# Patient Record
Sex: Female | Born: 1940 | Race: White | Hispanic: No | Smoking: Never smoker
Health system: Southern US, Community
[De-identification: ages and names within clinical notes are randomized; demographics above are authoritative.]

## PROBLEM LIST (undated history)

## (undated) DIAGNOSIS — E7800 Pure hypercholesterolemia, unspecified: Secondary | ICD-10-CM

## (undated) DIAGNOSIS — G459 Transient cerebral ischemic attack, unspecified: Secondary | ICD-10-CM

## (undated) DIAGNOSIS — Z800 Family history of malignant neoplasm of digestive organs: Secondary | ICD-10-CM

## (undated) DIAGNOSIS — Z801 Family history of malignant neoplasm of trachea, bronchus and lung: Secondary | ICD-10-CM

## (undated) DIAGNOSIS — Z803 Family history of malignant neoplasm of breast: Secondary | ICD-10-CM

## (undated) DIAGNOSIS — J302 Other seasonal allergic rhinitis: Secondary | ICD-10-CM

## (undated) DIAGNOSIS — Z923 Personal history of irradiation: Secondary | ICD-10-CM

## (undated) DIAGNOSIS — I10 Essential (primary) hypertension: Secondary | ICD-10-CM

## (undated) DIAGNOSIS — K219 Gastro-esophageal reflux disease without esophagitis: Secondary | ICD-10-CM

## (undated) DIAGNOSIS — Z9221 Personal history of antineoplastic chemotherapy: Secondary | ICD-10-CM

## (undated) HISTORY — DX: Family history of malignant neoplasm of trachea, bronchus and lung: Z80.1

## (undated) HISTORY — PX: MASTECTOMY: SHX3

## (undated) HISTORY — DX: Family history of malignant neoplasm of breast: Z80.3

## (undated) HISTORY — PX: BACK SURGERY: SHX140

## (undated) HISTORY — PX: PARTIAL HYSTERECTOMY: SHX80

## (undated) HISTORY — DX: Transient cerebral ischemic attack, unspecified: G45.9

## (undated) HISTORY — DX: Family history of malignant neoplasm of digestive organs: Z80.0

## (undated) HISTORY — PX: CHOLECYSTECTOMY: SHX55

---

## 1998-07-06 ENCOUNTER — Encounter: Admission: RE | Admit: 1998-07-06 | Discharge: 1998-10-04 | Payer: Self-pay | Admitting: *Deleted

## 1998-08-17 ENCOUNTER — Other Ambulatory Visit: Admission: RE | Admit: 1998-08-17 | Discharge: 1998-08-17 | Payer: Self-pay | Admitting: Obstetrics and Gynecology

## 1999-11-02 ENCOUNTER — Other Ambulatory Visit: Admission: RE | Admit: 1999-11-02 | Discharge: 1999-11-02 | Payer: Self-pay | Admitting: Obstetrics and Gynecology

## 2000-11-23 ENCOUNTER — Other Ambulatory Visit: Admission: RE | Admit: 2000-11-23 | Discharge: 2000-11-23 | Payer: Self-pay | Admitting: Obstetrics and Gynecology

## 2001-11-26 ENCOUNTER — Other Ambulatory Visit: Admission: RE | Admit: 2001-11-26 | Discharge: 2001-11-26 | Payer: Self-pay | Admitting: Obstetrics and Gynecology

## 2002-12-08 ENCOUNTER — Other Ambulatory Visit: Admission: RE | Admit: 2002-12-08 | Discharge: 2002-12-08 | Payer: Self-pay | Admitting: Obstetrics and Gynecology

## 2003-12-09 ENCOUNTER — Other Ambulatory Visit: Admission: RE | Admit: 2003-12-09 | Discharge: 2003-12-09 | Payer: Self-pay | Admitting: Obstetrics and Gynecology

## 2004-12-09 ENCOUNTER — Other Ambulatory Visit: Admission: RE | Admit: 2004-12-09 | Discharge: 2004-12-09 | Payer: Self-pay | Admitting: Obstetrics and Gynecology

## 2005-07-31 ENCOUNTER — Ambulatory Visit (HOSPITAL_COMMUNITY): Admission: RE | Admit: 2005-07-31 | Discharge: 2005-07-31 | Payer: Self-pay | Admitting: *Deleted

## 2005-12-11 ENCOUNTER — Other Ambulatory Visit: Admission: RE | Admit: 2005-12-11 | Discharge: 2005-12-11 | Payer: Self-pay | Admitting: Obstetrics and Gynecology

## 2009-07-26 ENCOUNTER — Encounter: Admission: RE | Admit: 2009-07-26 | Discharge: 2009-07-26 | Payer: Self-pay | Admitting: Internal Medicine

## 2009-08-02 ENCOUNTER — Encounter: Admission: RE | Admit: 2009-08-02 | Discharge: 2009-08-02 | Payer: Self-pay | Admitting: Neurosurgery

## 2009-08-05 ENCOUNTER — Ambulatory Visit (HOSPITAL_COMMUNITY): Admission: RE | Admit: 2009-08-05 | Discharge: 2009-08-06 | Payer: Self-pay | Admitting: Neurosurgery

## 2009-10-09 ENCOUNTER — Encounter: Admission: RE | Admit: 2009-10-09 | Discharge: 2009-10-09 | Payer: Self-pay | Admitting: Neurosurgery

## 2009-10-14 ENCOUNTER — Ambulatory Visit (HOSPITAL_COMMUNITY): Admission: RE | Admit: 2009-10-14 | Discharge: 2009-10-15 | Payer: Self-pay | Admitting: Neurosurgery

## 2010-11-21 ENCOUNTER — Encounter: Admission: RE | Admit: 2010-11-21 | Discharge: 2010-11-21 | Payer: Self-pay | Admitting: Internal Medicine

## 2011-03-30 LAB — BASIC METABOLIC PANEL
Calcium: 9.6 mg/dL (ref 8.4–10.5)
Creatinine, Ser: 0.91 mg/dL (ref 0.4–1.2)
GFR calc Af Amer: >60 mL/min (ref >60)
GFR calc non Af Amer: >60 mL/min (ref >60)
Glucose, Bld: 118 mg/dL — ABNORMAL HIGH (ref 70–99)
Sodium: 137 mEq/L (ref 135–145)

## 2011-03-30 LAB — CBC
Hemoglobin: 14.5 g/dL (ref 12.0–15.0)
RBC: 4.59 MIL/uL (ref 3.87–5.11)
RDW: 13.8 % (ref 11.5–15.5)

## 2011-04-02 LAB — URINALYSIS, ROUTINE W REFLEX MICROSCOPIC
Leukocytes, UA: NEGATIVE
Nitrite: NEGATIVE
Specific Gravity, Urine: 1.021 (ref 1.005–1.030)
Urobilinogen, UA: 0.2 mg/dL (ref 0.0–1.0)
pH: 6 (ref 5.0–8.0)

## 2011-04-02 LAB — CBC
HCT: 43.9 % (ref 36.0–46.0)
Hemoglobin: 14.9 g/dL (ref 12.0–15.0)
RBC: 4.89 MIL/uL (ref 3.87–5.11)
RDW: 14 % (ref 11.5–15.5)

## 2011-04-02 LAB — URINE MICROSCOPIC-ADD ON

## 2011-04-02 LAB — APTT: aPTT: 24 seconds (ref 24–37)

## 2011-04-02 LAB — BASIC METABOLIC PANEL
Calcium: 9.3 mg/dL (ref 8.4–10.5)
GFR calc Af Amer: 48 mL/min — ABNORMAL LOW (ref >60)
GFR calc non Af Amer: 39 mL/min — ABNORMAL LOW (ref >60)
Glucose, Bld: 119 mg/dL — ABNORMAL HIGH (ref 70–99)
Potassium: 4.2 mEq/L (ref 3.5–5.1)
Sodium: 136 mEq/L (ref 135–145)

## 2011-04-02 LAB — PROTIME-INR: INR: 0.9 (ref 0.00–1.49)

## 2011-05-09 NOTE — Op Note (Signed)
Tina Ellis, Tina Ellis               ACCOUNT NO.:  [id number]   MEDICAL RECORD NO.:  [id number]          PATIENT TYPE:  INP   LOCATION:  3526                         FACILITY:  [facility]   PHYSICIAN:  Clydene Fake, M.D.  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   PROCEDURE:  Left L3-4 semi-hemilaminectomy and diskectomy,  microdissection with microscope.   SURGEON:  Clydene Fake, MD   ASSISTANT:  Hewitt Shorts, MD   General endotracheal tube anesthesia.   ESTIMATED BLOOD LOSS:  Minimal.   BLOOD GIVEN:  None.   COMPLICATIONS:  None.   REASON FOR PROCEDURE:  The patient is a 70 year old woman who has been  having severe back and left leg pain and numbness.  MRI and myelogram  done showing extruded fragments at left side L3-4 causing nerve root  compression.  The patient brought in for decompression.   PROCEDURE IN DETAIL:  The patient was brought to the operating room.  General anesthesia was induced.  The patient was placed in a prone  position on the Wilson frame.  All pressure points were padded.  The  patient was prepped and draped in a sterile fashion.  The site of  incision was injected with 10 mL of 1% lidocaine with epinephrine.  Needle was placed in the interspace.  X-ray was obtained, showed we were  pointing at the L3-4 interspace.  Incision was then made centered where  the needle was.  Incision taken down to the fascia.  Hemostasis was  obtained with Bovie cauterization.  The fascia was incised with Bovie.  Subperiosteal dissection was done over the spinous process and lamina to  the facets at L3-4 level.  Self-retaining retractors were placed.  Markers were placed in the interspace.  Another x-ray was  obtained  confirming our positioning at L3-4.  High-speed drill was used start  semi-hemilaminectomy and medial facetectomy, completed with Kerrison  punches.  Ligamentum flavum was removed.  There was a significant  hypertrophic facet and the medial aspect was removed decompressing the  canal.  We then explored the epidural space and there was some  membranous tissue over the dura that we carefully peeled off from the  disk space and then there is a large mass below the disk space.  It was  within this epidural fibrous tissue.  We carefully dissected through  that, found the fragment of disk herniation and removed this with  pituitary rongeurs and hooks.  __________ was placed to reflect the  dura, the thecal sac, and nerve root medially.  We then explored the  area and removed further fragments and there were some couple of  fragments out under the L4 root and these were removed.  When we had  finished, we had good decompression.  We explored the disk space and  there was a boggy disk protrusion.  As  we pressed down on that, there  was more free fragments of disk, subligamentous, came out a small  annular tear but after that there was very hard fibrous bone spur and no  further soft disk.  We did not enter the disk space at this point.  We  did bipolar the area.  Again, we explored the thecal sac and nerve  roots.  The L3 and L4 roots were well decompressed.  We irrigated with  antibiotic solution.  We had good hemostasis with Gelfoam and thrombin.  This was then irrigated out.  We had very good hemostasis.  Retractors  were removed.  Fascia closed with 0 Vicryl interrupted sutures,  subcutaneous tissue closed with 0, 2-0, and 3-0 Vicryl interrupted  sutures, and skin closed with benzoin and Steri-Strips.  Dressing was  placed.  The patient was placed back in supine position, awoken from  anesthesia, and transferred to recovery room in stable condition.            ______________________________  Clydene Fake, M.D.     JRH/MEDQ  D:  08/05/2009  T:  08/06/2009  Job:  [id number]

## 2011-12-28 DIAGNOSIS — J029 Acute pharyngitis, unspecified: Secondary | ICD-10-CM | POA: Diagnosis not present

## 2011-12-28 DIAGNOSIS — J069 Acute upper respiratory infection, unspecified: Secondary | ICD-10-CM | POA: Diagnosis not present

## 2012-02-15 DIAGNOSIS — E559 Vitamin D deficiency, unspecified: Secondary | ICD-10-CM | POA: Diagnosis not present

## 2012-02-15 DIAGNOSIS — N390 Urinary tract infection, site not specified: Secondary | ICD-10-CM | POA: Diagnosis not present

## 2012-02-15 DIAGNOSIS — I10 Essential (primary) hypertension: Secondary | ICD-10-CM | POA: Diagnosis not present

## 2012-02-15 DIAGNOSIS — Z79899 Other long term (current) drug therapy: Secondary | ICD-10-CM | POA: Diagnosis not present

## 2012-02-15 DIAGNOSIS — E782 Mixed hyperlipidemia: Secondary | ICD-10-CM | POA: Diagnosis not present

## 2012-02-19 DIAGNOSIS — E782 Mixed hyperlipidemia: Secondary | ICD-10-CM | POA: Diagnosis not present

## 2012-02-19 DIAGNOSIS — R5381 Other malaise: Secondary | ICD-10-CM | POA: Diagnosis not present

## 2012-02-19 DIAGNOSIS — Z1212 Encounter for screening for malignant neoplasm of rectum: Secondary | ICD-10-CM | POA: Diagnosis not present

## 2012-02-19 DIAGNOSIS — I10 Essential (primary) hypertension: Secondary | ICD-10-CM | POA: Diagnosis not present

## 2012-02-19 DIAGNOSIS — Z23 Encounter for immunization: Secondary | ICD-10-CM | POA: Diagnosis not present

## 2012-02-19 DIAGNOSIS — Z79899 Other long term (current) drug therapy: Secondary | ICD-10-CM | POA: Diagnosis not present

## 2012-02-19 DIAGNOSIS — K219 Gastro-esophageal reflux disease without esophagitis: Secondary | ICD-10-CM | POA: Diagnosis not present

## 2012-02-19 DIAGNOSIS — E2839 Other primary ovarian failure: Secondary | ICD-10-CM | POA: Diagnosis not present

## 2012-04-18 DIAGNOSIS — M7700 Medial epicondylitis, unspecified elbow: Secondary | ICD-10-CM | POA: Diagnosis not present

## 2012-08-15 DIAGNOSIS — I10 Essential (primary) hypertension: Secondary | ICD-10-CM | POA: Diagnosis not present

## 2012-08-15 DIAGNOSIS — E782 Mixed hyperlipidemia: Secondary | ICD-10-CM | POA: Diagnosis not present

## 2012-08-22 DIAGNOSIS — I10 Essential (primary) hypertension: Secondary | ICD-10-CM | POA: Diagnosis not present

## 2012-08-22 DIAGNOSIS — Z800 Family history of malignant neoplasm of digestive organs: Secondary | ICD-10-CM | POA: Diagnosis not present

## 2012-08-22 DIAGNOSIS — K219 Gastro-esophageal reflux disease without esophagitis: Secondary | ICD-10-CM | POA: Diagnosis not present

## 2012-08-22 DIAGNOSIS — E7800 Pure hypercholesterolemia, unspecified: Secondary | ICD-10-CM | POA: Diagnosis not present

## 2012-09-27 DIAGNOSIS — M545 Low back pain, unspecified: Secondary | ICD-10-CM | POA: Diagnosis not present

## 2012-10-10 DIAGNOSIS — Z23 Encounter for immunization: Secondary | ICD-10-CM | POA: Diagnosis not present

## 2012-10-31 DIAGNOSIS — H52229 Regular astigmatism, unspecified eye: Secondary | ICD-10-CM | POA: Diagnosis not present

## 2012-10-31 DIAGNOSIS — H5200 Hypermetropia, unspecified eye: Secondary | ICD-10-CM | POA: Diagnosis not present

## 2012-10-31 DIAGNOSIS — H524 Presbyopia: Secondary | ICD-10-CM | POA: Diagnosis not present

## 2012-10-31 DIAGNOSIS — H2589 Other age-related cataract: Secondary | ICD-10-CM | POA: Diagnosis not present

## 2012-11-06 DIAGNOSIS — H9209 Otalgia, unspecified ear: Secondary | ICD-10-CM | POA: Diagnosis not present

## 2012-11-06 DIAGNOSIS — J329 Chronic sinusitis, unspecified: Secondary | ICD-10-CM | POA: Diagnosis not present

## 2012-12-27 DIAGNOSIS — M545 Low back pain, unspecified: Secondary | ICD-10-CM | POA: Diagnosis not present

## 2012-12-31 DIAGNOSIS — J069 Acute upper respiratory infection, unspecified: Secondary | ICD-10-CM | POA: Diagnosis not present

## 2013-01-14 DIAGNOSIS — J40 Bronchitis, not specified as acute or chronic: Secondary | ICD-10-CM | POA: Diagnosis not present

## 2013-04-03 DIAGNOSIS — N390 Urinary tract infection, site not specified: Secondary | ICD-10-CM | POA: Diagnosis not present

## 2013-04-17 DIAGNOSIS — N390 Urinary tract infection, site not specified: Secondary | ICD-10-CM | POA: Diagnosis not present

## 2013-04-22 DIAGNOSIS — I10 Essential (primary) hypertension: Secondary | ICD-10-CM | POA: Diagnosis not present

## 2013-04-22 DIAGNOSIS — R5381 Other malaise: Secondary | ICD-10-CM | POA: Diagnosis not present

## 2013-04-22 DIAGNOSIS — K219 Gastro-esophageal reflux disease without esophagitis: Secondary | ICD-10-CM | POA: Diagnosis not present

## 2013-04-22 DIAGNOSIS — R5383 Other fatigue: Secondary | ICD-10-CM | POA: Diagnosis not present

## 2013-04-22 DIAGNOSIS — E559 Vitamin D deficiency, unspecified: Secondary | ICD-10-CM | POA: Diagnosis not present

## 2013-04-23 DIAGNOSIS — R5383 Other fatigue: Secondary | ICD-10-CM | POA: Diagnosis not present

## 2013-04-23 DIAGNOSIS — E7800 Pure hypercholesterolemia, unspecified: Secondary | ICD-10-CM | POA: Diagnosis not present

## 2013-04-23 DIAGNOSIS — R5381 Other malaise: Secondary | ICD-10-CM | POA: Diagnosis not present

## 2013-04-23 DIAGNOSIS — E559 Vitamin D deficiency, unspecified: Secondary | ICD-10-CM | POA: Diagnosis not present

## 2013-04-23 DIAGNOSIS — I10 Essential (primary) hypertension: Secondary | ICD-10-CM | POA: Diagnosis not present

## 2013-05-01 DIAGNOSIS — H40019 Open angle with borderline findings, low risk, unspecified eye: Secondary | ICD-10-CM | POA: Diagnosis not present

## 2013-05-07 DIAGNOSIS — R3129 Other microscopic hematuria: Secondary | ICD-10-CM | POA: Diagnosis not present

## 2013-05-07 DIAGNOSIS — N3941 Urge incontinence: Secondary | ICD-10-CM | POA: Diagnosis not present

## 2013-05-23 DIAGNOSIS — R3129 Other microscopic hematuria: Secondary | ICD-10-CM | POA: Diagnosis not present

## 2013-07-17 DIAGNOSIS — R319 Hematuria, unspecified: Secondary | ICD-10-CM | POA: Diagnosis not present

## 2013-07-17 DIAGNOSIS — N390 Urinary tract infection, site not specified: Secondary | ICD-10-CM | POA: Diagnosis not present

## 2013-07-31 DIAGNOSIS — H524 Presbyopia: Secondary | ICD-10-CM | POA: Diagnosis not present

## 2013-07-31 DIAGNOSIS — H5200 Hypermetropia, unspecified eye: Secondary | ICD-10-CM | POA: Diagnosis not present

## 2013-07-31 DIAGNOSIS — H52229 Regular astigmatism, unspecified eye: Secondary | ICD-10-CM | POA: Diagnosis not present

## 2013-07-31 DIAGNOSIS — H40059 Ocular hypertension, unspecified eye: Secondary | ICD-10-CM | POA: Diagnosis not present

## 2013-09-08 DIAGNOSIS — R3129 Other microscopic hematuria: Secondary | ICD-10-CM | POA: Diagnosis not present

## 2013-09-08 DIAGNOSIS — N362 Urethral caruncle: Secondary | ICD-10-CM | POA: Diagnosis not present

## 2013-10-20 DIAGNOSIS — R933 Abnormal findings on diagnostic imaging of other parts of digestive tract: Secondary | ICD-10-CM | POA: Diagnosis not present

## 2013-10-20 DIAGNOSIS — Z800 Family history of malignant neoplasm of digestive organs: Secondary | ICD-10-CM | POA: Diagnosis not present

## 2013-11-11 ENCOUNTER — Other Ambulatory Visit: Payer: Self-pay | Admitting: Gastroenterology

## 2013-11-11 DIAGNOSIS — K2960 Other gastritis without bleeding: Secondary | ICD-10-CM | POA: Diagnosis not present

## 2013-11-11 DIAGNOSIS — D126 Benign neoplasm of colon, unspecified: Secondary | ICD-10-CM | POA: Diagnosis not present

## 2013-11-11 DIAGNOSIS — K5730 Diverticulosis of large intestine without perforation or abscess without bleeding: Secondary | ICD-10-CM | POA: Diagnosis not present

## 2013-11-11 DIAGNOSIS — D131 Benign neoplasm of stomach: Secondary | ICD-10-CM | POA: Diagnosis not present

## 2013-11-11 DIAGNOSIS — Z800 Family history of malignant neoplasm of digestive organs: Secondary | ICD-10-CM | POA: Diagnosis not present

## 2013-11-11 DIAGNOSIS — K319 Disease of stomach and duodenum, unspecified: Secondary | ICD-10-CM | POA: Diagnosis not present

## 2013-11-11 DIAGNOSIS — R933 Abnormal findings on diagnostic imaging of other parts of digestive tract: Secondary | ICD-10-CM | POA: Diagnosis not present

## 2013-11-11 DIAGNOSIS — Z1211 Encounter for screening for malignant neoplasm of colon: Secondary | ICD-10-CM | POA: Diagnosis not present

## 2013-12-13 ENCOUNTER — Emergency Department (HOSPITAL_BASED_OUTPATIENT_CLINIC_OR_DEPARTMENT_OTHER): Payer: Medicare Other

## 2013-12-13 ENCOUNTER — Encounter (HOSPITAL_BASED_OUTPATIENT_CLINIC_OR_DEPARTMENT_OTHER): Payer: Self-pay | Admitting: Emergency Medicine

## 2013-12-13 ENCOUNTER — Inpatient Hospital Stay (HOSPITAL_BASED_OUTPATIENT_CLINIC_OR_DEPARTMENT_OTHER)
Admission: EM | Admit: 2013-12-13 | Discharge: 2013-12-16 | DRG: 153 | Disposition: A | Discharge status: Home / Self Care | Payer: Medicare Other | Attending: Internal Medicine | Admitting: Internal Medicine

## 2013-12-13 DIAGNOSIS — I959 Hypotension, unspecified: Secondary | ICD-10-CM | POA: Diagnosis not present

## 2013-12-13 DIAGNOSIS — Z7982 Long term (current) use of aspirin: Secondary | ICD-10-CM | POA: Diagnosis not present

## 2013-12-13 DIAGNOSIS — E7800 Pure hypercholesterolemia, unspecified: Secondary | ICD-10-CM | POA: Diagnosis present

## 2013-12-13 DIAGNOSIS — J209 Acute bronchitis, unspecified: Secondary | ICD-10-CM | POA: Diagnosis not present

## 2013-12-13 DIAGNOSIS — R911 Solitary pulmonary nodule: Secondary | ICD-10-CM

## 2013-12-13 DIAGNOSIS — K219 Gastro-esophageal reflux disease without esophagitis: Secondary | ICD-10-CM | POA: Diagnosis present

## 2013-12-13 DIAGNOSIS — I10 Essential (primary) hypertension: Secondary | ICD-10-CM | POA: Diagnosis present

## 2013-12-13 DIAGNOSIS — J40 Bronchitis, not specified as acute or chronic: Secondary | ICD-10-CM

## 2013-12-13 DIAGNOSIS — N179 Acute kidney failure, unspecified: Secondary | ICD-10-CM

## 2013-12-13 DIAGNOSIS — Z801 Family history of malignant neoplasm of trachea, bronchus and lung: Secondary | ICD-10-CM

## 2013-12-13 DIAGNOSIS — N390 Urinary tract infection, site not specified: Secondary | ICD-10-CM | POA: Insufficient documentation

## 2013-12-13 DIAGNOSIS — E860 Dehydration: Secondary | ICD-10-CM | POA: Diagnosis not present

## 2013-12-13 DIAGNOSIS — Z79899 Other long term (current) drug therapy: Secondary | ICD-10-CM | POA: Diagnosis not present

## 2013-12-13 DIAGNOSIS — J449 Chronic obstructive pulmonary disease, unspecified: Secondary | ICD-10-CM | POA: Diagnosis not present

## 2013-12-13 DIAGNOSIS — J111 Influenza due to unidentified influenza virus with other respiratory manifestations: Secondary | ICD-10-CM | POA: Diagnosis not present

## 2013-12-13 DIAGNOSIS — J101 Influenza due to other identified influenza virus with other respiratory manifestations: Secondary | ICD-10-CM

## 2013-12-13 DIAGNOSIS — Z800 Family history of malignant neoplasm of digestive organs: Secondary | ICD-10-CM | POA: Diagnosis not present

## 2013-12-13 HISTORY — DX: Essential (primary) hypertension: I10

## 2013-12-13 HISTORY — DX: Pure hypercholesterolemia, unspecified: E78.00

## 2013-12-13 HISTORY — DX: Other seasonal allergic rhinitis: J30.2

## 2013-12-13 HISTORY — DX: Gastro-esophageal reflux disease without esophagitis: K21.9

## 2013-12-13 LAB — URINALYSIS, ROUTINE W REFLEX MICROSCOPIC
Protein, ur: NEGATIVE mg/dL
Specific Gravity, Urine: 1.016 (ref 1.005–1.030)
Urobilinogen, UA: 0.2 mg/dL (ref 0.0–1.0)

## 2013-12-13 LAB — CBC WITH DIFFERENTIAL/PLATELET
Basophils Absolute: 0 10*3/uL (ref 0.0–0.1)
Basophils Relative: 0 % (ref 0–1)
Eosinophils Relative: 1 % (ref 0–5)
HCT: 41.1 % (ref 36.0–46.0)
Hemoglobin: 13.8 g/dL (ref 12.0–15.0)
MCHC: 33.6 g/dL (ref 30.0–36.0)
MCV: 86.9 fL (ref 78.0–100.0)
Monocytes Absolute: 0.4 10*3/uL (ref 0.1–1.0)
Monocytes Relative: 9 % (ref 3–12)
Neutro Abs: 2.7 10*3/uL (ref 1.7–7.7)
RDW: 13.4 % (ref 11.5–15.5)

## 2013-12-13 LAB — COMPREHENSIVE METABOLIC PANEL
Albumin: 3.6 g/dL (ref 3.5–5.2)
BUN: 30 mg/dL — ABNORMAL HIGH (ref 6–23)
CO2: 24 mEq/L (ref 19–32)
Calcium: 9.4 mg/dL (ref 8.4–10.5)
Chloride: 95 mEq/L — ABNORMAL LOW (ref 96–112)
Creatinine, Ser: 1.5 mg/dL — ABNORMAL HIGH (ref 0.50–1.10)
GFR calc non Af Amer: 34 mL/min — ABNORMAL LOW (ref >90)
Total Bilirubin: 0.3 mg/dL (ref 0.3–1.2)

## 2013-12-13 LAB — URINE MICROSCOPIC-ADD ON

## 2013-12-13 MED ORDER — SODIUM CHLORIDE 0.9 % IV BOLUS (SEPSIS)
500.0000 mL | Freq: Once | INTRAVENOUS | Status: AC
  Administered 2013-12-13: 500 mL via INTRAVENOUS

## 2013-12-13 MED ORDER — LEVOFLOXACIN 750 MG PO TABS: 750.0000 mg | ORAL_TABLET | Freq: Every day | ORAL | Status: DC

## 2013-12-13 MED ORDER — SODIUM CHLORIDE 0.9 % IV BOLUS (SEPSIS)
1000.0000 mL | Freq: Once | INTRAVENOUS | Status: AC
  Administered 2013-12-13: 1000 mL via INTRAVENOUS

## 2013-12-13 MED ORDER — DEXTROSE 5 % IV SOLN
1.0000 g | Freq: Once | INTRAVENOUS | Status: AC
  Administered 2013-12-13: 1 g via INTRAVENOUS

## 2013-12-13 MED ORDER — CEFTRIAXONE SODIUM 1 G IJ SOLR
INTRAMUSCULAR | Status: AC
  Filled 2013-12-13: qty 10

## 2013-12-13 MED ORDER — KETOROLAC TROMETHAMINE 30 MG/ML IJ SOLN
30.0000 mg | Freq: Once | INTRAMUSCULAR | Status: AC
  Administered 2013-12-13: 30 mg via INTRAVENOUS
  Filled 2013-12-13: qty 1

## 2013-12-13 NOTE — ED Notes (Signed)
Report called to Nelwyn Salisbury.

## 2013-12-13 NOTE — ED Notes (Signed)
Discussed with Dr. Gwendolyn Grant about patient's BP.  Will administer an additional IVF bolus

## 2013-12-13 NOTE — ED Notes (Signed)
[facility] here and has assumed care of the patient. 

## 2013-12-13 NOTE — ED Notes (Signed)
Awaiting [facility] at this time.  Patient aware that she has a bed assignment and is awaiting transport.

## 2013-12-13 NOTE — ED Notes (Signed)
Pt finally able to void.  Tolerated ambulating to BR well.  Son is back at bedside.

## 2013-12-13 NOTE — ED Notes (Addendum)
Pt having fever, cough, aches and pains, rib pain since Tuesday.  Pt states she feels weak all over.  PCP ordered Zithromax for pt until she could be seen in the office next week.

## 2013-12-13 NOTE — H&P (Addendum)
PCP: Dr. Renne [facility] Lynnea Ferrier  Chief Complaint:  Drop in blood pressure  HPI: Tina Ellis is a 72 y.o. female   has a past medical history of Hypertension; Hypercholesteremia; Seasonal allergies; and GERD (gastroesophageal reflux disease).   Presented with  Reports her grandson had a cold in the beginning of the week then her husband and finally 5 days ago she started to have cough with sore ribs, fever up to 102. She has not been able to eat much. She called into office and got prescribtion for Zpack.  She presented to [facility] and was found to be hypotensive down to 80/40 and orthostatic. After getting IVF she is improved. Due to hypotension patient is being transferred to [facility] stepdown. Blood cultures pending.  Hospitalist called for admitsson. She did get her flu shot this year. She initially though to have UTI but no WBC in urine and asymptomatic. She has received one dose of rocephin and one dose of azythromycine. CXR is unremarkable.   Review of Systems:    Pertinent positives include: Fevers, chills,  productive cough,   Constitutional:  No weight loss, night sweats,  fatigue, weight loss  HEENT:  No headaches, Difficulty swallowing,Tooth/dental problems,Sore throat,  No sneezing, itching, ear ache, nasal congestion, post nasal drip,  Cardio-vascular:  No chest pain, Orthopnea, PND, anasarca, dizziness, palpitations.no Bilateral lower extremity swelling  GI:  No heartburn, indigestion, abdominal pain, nausea, vomiting, diarrhea, change in bowel habits, loss of appetite, melena, blood in stool, hematemesis Resp:  no shortness of breath at rest. No dyspnea on exertion, No excess mucus, noNo non-productive cough, No coughing up of blood.No change in color of mucus.No wheezing. Skin:  no rash or lesions. No jaundice GU:  no dysuria, change in color of urine, no urgency or frequency. No straining to urinate.  No flank pain.  Musculoskeletal:  No joint pain or no joint  swelling. No decreased range of motion. No back pain.  Psych:  No change in mood or affect. No depression or anxiety. No memory loss.  Neuro: no localizing neurological complaints, no tingling, no weakness, no double vision, no gait abnormality, no slurred speech, no confusion  Otherwise ROS are negative except for above, 10 systems were reviewed  Past Medical History: Past Medical History  Diagnosis Date  . Hypertension   . Hypercholesteremia   . Seasonal allergies   . GERD (gastroesophageal reflux disease)    Past Surgical History  Procedure Laterality Date  . Back surgery       Medications: Prior to Admission medications   Medication Sig Start Date End Date Taking? Authorizing Provider  amLODipine (NORVASC) 5 MG tablet Take 5 mg by mouth every morning.    Yes Historical Provider, MD  aspirin 81 MG tablet Take 81 mg by mouth every morning.    Yes Historical Provider, MD  azithromycin (ZITHROMAX) 250 MG tablet Take 250-500 mg by mouth See admin instructions. Take 2 tablets on day 2 then take 1 tablet daily for the next 4 days   Yes Historical Provider, MD  cholecalciferol (VITAMIN D) 400 UNITS TABS tablet Take 400 Units by mouth every morning.    Yes Historical Provider, MD  omeprazole (PRILOSEC) 20 MG capsule Take 20 mg by mouth every morning.    Yes Historical Provider, MD  rosuvastatin (CRESTOR) 5 MG tablet Take 2.5 mg by mouth every evening.    Yes Historical Provider, MD  levofloxacin (LEVAQUIN) 750 MG tablet Take 1 tablet (750 mg total) by mouth daily.  X 7 days 12/13/13   Rolan Bucco, MD    Allergies:  No Known Allergies  Social History:  Ambulatory   independently   Lives at   Home at home   reports that she has never smoked. She does not have any smokeless tobacco history on file. She reports that she does not drink alcohol or use illicit drugs.   Family History: family history includes Colon cancer in her mother; Crohn's disease in her brother; Lung cancer in  her father.    Physical Exam: Patient Vitals for the past 24 hrs:  BP Temp Temp src Pulse Resp SpO2 Height Weight  12/13/13 2245 150/65 mmHg - - 78 21 100 % - -  12/13/13 2123 141/68 mmHg - - - 15 100 % - -  12/13/13 2006 123/62 mmHg 97.8 F (36.6 C) Oral 79 15 98 % - -  12/13/13 1814 96/51 mmHg - - 76 16 100 % - -  12/13/13 1551 106/49 mmHg - - - 13 100 % - -  12/13/13 1520 99/45 mmHg - - - 13 97 % - -  12/13/13 1338 102/53 mmHg - - - 15 100 % - -  12/13/13 1323 83/42 mmHg - - - - 95 % - -  12/13/13 1320 89/61 mmHg 97.7 F (36.5 C) - 98 16 - 5\' 4"  (1.626 m) 70.308 kg (155 lb)    1. General:  in No Acute distress 2. Psychological: Alert and   Oriented 3. Head/ENT:    Dry Mucous Membranes                          Head Non traumatic, neck supple                          Normal Dentition 4. SKIN:  decreased Skin turgor,  Skin clean Dry and intact no rash 5. Heart: Regular rate and rhythm no Murmur, Rub or gallop 6. Lungs: Clear to auscultation bilaterally, no wheezes or crackles   7. Abdomen: Soft, non-tender, Non distended 8. Lower extremities: no clubbing, cyanosis, or edema 9. Neurologically Grossly intact, moving all 4 extremities equally 10. MSK: Normal range of motion  body mass index is 26.59 kg/(m^2).   Labs on Admission:   Recent Labs  12/13/13 1340  NA 135  K 3.4*  CL 95*  CO2 24  GLUCOSE 171*  BUN 30*  CREATININE 1.50*  CALCIUM 9.4    Recent Labs  12/13/13 1340  AST 23  ALT 12  ALKPHOS 51  BILITOT 0.3  PROT 7.4  ALBUMIN 3.6   No results found for this basename: LIPASE, AMYLASE,  in the last 72 hours  Recent Labs  12/13/13 1340  WBC 4.7  NEUTROABS 2.7  HGB 13.8  HCT 41.1  MCV 86.9  PLT 245   No results found for this basename: CKTOTAL, CKMB, CKMBINDEX, TROPONINI,  in the last 72 hours No results found for this basename: TSH, T4TOTAL, FREET3, T3FREE, THYROIDAB,  in the last 72 hours No results found for this basename: VITAMINB12, FOLATE,  FERRITIN, TIBC, IRON, RETICCTPCT,  in the last 72 hours No results found for this basename: HGBA1C    Estimated Creatinine Clearance: 32.6 ml/min (by C-G formula based on Cr of 1.5). ABG No results found for this basename: phart, pco2, po2, hco3, tco2, acidbasedef, o2sat     No results found for this basename: DDIMER  Other results:  I have pearsonaly reviewed this: ECG REPORT  Rate: 100  Rhythm: Sinus tachy ST&T Change: no ischemic changes  UA cloudy but no UTI   Cultures: No results found for this basename: sdes, specrequest, cult, reptstatus       Radiological Exams on Admission: Dg Chest 2 View  12/13/2013   CLINICAL DATA:  Cough fever and weakness for the preceding 4 days  EXAM: CHEST  2 VIEW  COMPARISON:  Uppermost images of a CT scan of the abdomen dated May 23, 2013 and a PA and lateral chest x-ray dated August 05, 2009.  FINDINGS: The lungs are hyperinflated with hemidiaphragm flattening. There is no focal infiltrate. There is subtle nodularity that projects between the posterior aspects of the left 9th and 10th ribs. This may lie in the retrocardiac region on the lateral film. The cardiopericardial silhouette is normal in size. The pulmonary vascularity is not engorged. The mediastinum is normal in width. There is no pleural effusion or pneumothorax. The observed portions of the bony thorax appear normal.  IMPRESSION: 1. 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 There is no evidence of pneumonia nor CHF. 2. There is new subtle nodularity and may lie in the left lower lobe. Follow-up chest CT scanning is recommended to evaluate the lung parenchymal more completely.   Electronically Signed   By: David  [place]   On: 12/13/2013 13:59    Chart has been reviewed  Assessment/Plan  72 yo F with transient hypotension and acute renal failure in the setting opf poor PO intake and likely viral illness  Present on Admission:  . Hypotension, unspecified - improved now  likely due to dehydration, continue IVF, sepsis less likely, blood culture pending, continue  zithromax for now, hold off on rocephin and await results of urine culture . Dehydration - IVF . Acute renal failure - urine electrolytes, IVF and follow up Cr, hold diovan Viral illness await results of influenza PCR, too far out for tamiflu  Prophylaxis:  Lovenox, Protonix  CODE STATUS: FULL CODE  Other plan as per orders.  I have spent a total of 55 min on this admission  Viera Okonski 12/13/2013, 11:50 PM

## 2013-12-13 NOTE — ED Provider Notes (Signed)
CSN: [id number]     Arrival date & time 12/13/13  1307 History   First MD Initiated Contact with Patient 12/13/13 1327     Chief Complaint  Patient presents with  . Cough  . Chest Pain  . Weakness   (Consider location/radiation/quality/duration/timing/severity/associated sxs/prior Treatment) HPI Comments: Patient presents with fever and cough. She states her symptoms started 5 days ago. The cough has been getting worse. She's had fevers up to 101. She is sore across her ribs from coughing. She feels weak all over. She gets lightheaded when she stands up. She started Zithromax which her primary care provider called in for her 4 days ago. She has 2 more days of that left. She denies any urinary symptoms. She denies a shortness of breath. She denies any nausea vomiting or diarrhea. She does feel achy all over.  Patient is a 72 y.o. female presenting with cough, chest pain, and weakness.  Cough Associated symptoms: chest pain, chills, fever and myalgias   Associated symptoms: no diaphoresis, no headaches, no rash, no rhinorrhea and no shortness of breath   Chest Pain Associated symptoms: cough, fatigue, fever and weakness   Associated symptoms: no abdominal pain, no back pain, no diaphoresis, no dizziness, no headache, no nausea, no numbness, no shortness of breath and not vomiting   Weakness Associated symptoms include chest pain. Pertinent negatives include no abdominal pain, no headaches and no shortness of breath.    Past Medical History  Diagnosis Date  . Hypertension   . Hypercholesteremia   . Seasonal allergies    Past Surgical History  Procedure Laterality Date  . Back surgery     No family history on file. History  Substance Use Topics  . Smoking status: Never Smoker   . Smokeless tobacco: Not on file  . Alcohol Use: Not on file   OB History   Grav Para Term Preterm Abortions TAB SAB Ect Mult Living                 Review of Systems  Constitutional: Positive for  fever, chills, activity change, appetite change and fatigue. Negative for diaphoresis.  HENT: Positive for congestion and postnasal drip. Negative for rhinorrhea and sneezing.   Eyes: Negative.   Respiratory: Positive for cough. Negative for chest tightness and shortness of breath.   Cardiovascular: Positive for chest pain. Negative for leg swelling.  Gastrointestinal: Negative for nausea, vomiting, abdominal pain, diarrhea and blood in stool.  Genitourinary: Negative for frequency, hematuria, flank pain and difficulty urinating.  Musculoskeletal: Positive for myalgias. Negative for arthralgias and back pain.  Skin: Negative for rash.  Neurological: Positive for weakness. Negative for dizziness, speech difficulty, numbness and headaches.    Allergies  Review of patient's allergies indicates no known allergies.  Home Medications   Current Outpatient Rx  Name  Route  Sig  Dispense  Refill  . amLODipine (NORVASC) 5 MG tablet   Oral   Take 5 mg by mouth daily.         Marland Kitchen aspirin 81 MG tablet   Oral   Take 81 mg by mouth daily.         . cholecalciferol (VITAMIN D) 400 UNITS TABS tablet   Oral   Take 400 Units by mouth.         Marland Kitchen omeprazole (PRILOSEC) 20 MG capsule   Oral   Take 20 mg by mouth daily.         . rosuvastatin (CRESTOR) 5 MG tablet  Oral   Take 2.5 mg by mouth daily.         Marland Kitchen levofloxacin (LEVAQUIN) 750 MG tablet   Oral   Take 1 tablet (750 mg total) by mouth daily. X 7 days   7 tablet   0    BP 106/49  Pulse 98  Temp(Src) 97.7 F (36.5 C)  Resp 13  Ht 5\' 4"  (1.626 m)  Wt 155 lb (70.308 kg)  BMI 26.59 kg/m2  SpO2 100% Physical Exam  Constitutional: She is oriented to person, place, and time. She appears well-developed and well-nourished.  HENT:  Head: Normocephalic and atraumatic.  Mouth/Throat: Oropharynx is clear and moist.  Eyes: Pupils are equal, round, and reactive to light.  Neck: Normal range of motion. Neck supple.   Cardiovascular: Normal rate, regular rhythm and normal heart sounds.   Pulmonary/Chest: Effort normal and breath sounds normal. No respiratory distress. She has no wheezes. She has no rales. She exhibits no tenderness.  Abdominal: Soft. Bowel sounds are normal. There is no tenderness. There is no rebound and no guarding.  Musculoskeletal: Normal range of motion. She exhibits no edema.  Lymphadenopathy:    She has no cervical adenopathy.  Neurological: She is alert and oriented to person, place, and time.  Skin: Skin is warm and dry. No rash noted.  Psychiatric: She has a normal mood and affect.    ED Course  Procedures (including critical care time) Labs Review Labs Reviewed  COMPREHENSIVE METABOLIC PANEL - Abnormal; Notable for the following:    Potassium 3.4 (*)    Chloride 95 (*)    Glucose, Bld 171 (*)    BUN 30 (*)    Creatinine, Ser 1.50 (*)    GFR calc non Af Amer 34 (*)    GFR calc Af Amer 39 (*)    All other components within normal limits  CBC WITH DIFFERENTIAL  URINALYSIS, ROUTINE W REFLEX MICROSCOPIC   Imaging Review Dg Chest 2 View  12/13/2013   CLINICAL DATA:  Cough fever and weakness for the preceding 4 days  EXAM: CHEST  2 VIEW  COMPARISON:  Uppermost images of a CT scan of the abdomen dated May 23, 2013 and a PA and lateral chest x-ray dated August 05, 2009.  FINDINGS: The lungs are hyperinflated with hemidiaphragm flattening. There is no focal infiltrate. There is subtle nodularity that projects between the posterior aspects of the left 9th and 10th ribs. This may lie in the retrocardiac region on the lateral film. The cardiopericardial silhouette is normal in size. The pulmonary vascularity is not engorged. The mediastinum is normal in width. There is no pleural effusion or pneumothorax. The observed portions of the bony thorax appear normal.  IMPRESSION: 1. 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 There is no evidence of pneumonia nor CHF. 2. There is new  subtle nodularity and may lie in the left lower lobe. Follow-up chest CT scanning is recommended to evaluate the lung parenchymal more completely.   Electronically Signed   By: David  [place]   On: 12/13/2013 13:59    EKG Interpretation    Date/Time:  Saturday December 13 2013 13:17:14 EST Ventricular Rate:  100 PR Interval:  136 QRS Duration: 90 QT Interval:  360 QTC Calculation: 464 R Axis:   79 Text Interpretation:  Normal sinus rhythm Possible Left atrial enlargement Borderline ECG No old tracing to compare Confirmed by Dagan Heinz  MD, Zacharia Sowles ([id number]) on 12/13/2013 2:04:14 PM  MDM   1. Bronchitis   2. Dehydration   3. Lung nodule    Patient was initially hypotensive on arrival but has improved with IV fluids. She did take her Diovan this morning. She's been given IV fluids and is feeling a little bit better after that. I will keep her around her for a little bit longer to make sure that she can ambulate without being symptomatic. I will also make sure that she can tolerate by mouth intake without vomiting. And likely she can be discharged home. There is no evidence of pneumonia. She likely has bronchitis versus influenza. Given that her symptoms started 5 days ago I did not feel that Tamiflu would be effective. I will go ahead and switch her to Levaquin. I advised her to hold off on her blood pressure medicines until Monday and she can followup with her doctor on Monday. I also talked to her about the lung nodule that will need outpatient CT scan.  Still awaiting u/a.  Dr. Gwendolyn Grant to follow up on this and discharge if pt is able to ambulate/eat okay.    Rolan Bucco, MD 12/13/13 [phone]

## 2013-12-13 NOTE — ED Notes (Signed)
Pt ambulatory to BR for UA.  

## 2013-12-13 NOTE — ED Provider Notes (Signed)
1600 - Care from Dr. Fredderick Phenix. Patient here with concerns of influenza like illness for 5 days. Plan on discharging with Levaquin if patient's urine is ok and BP stabilizes.  Despite fluids, pressures still soft in the 90s. Patient still feeling miserable. UA with possible UTI, started on Rocephin. Admitted to stepdown at [facility].   1. Bronchitis   2. Dehydration   3. Lung nodule      Dagmar Hait, MD 12/13/13 2005

## 2013-12-14 DIAGNOSIS — N179 Acute kidney failure, unspecified: Secondary | ICD-10-CM | POA: Diagnosis present

## 2013-12-14 DIAGNOSIS — E860 Dehydration: Secondary | ICD-10-CM

## 2013-12-14 DIAGNOSIS — I959 Hypotension, unspecified: Secondary | ICD-10-CM | POA: Diagnosis not present

## 2013-12-14 DIAGNOSIS — J40 Bronchitis, not specified as acute or chronic: Secondary | ICD-10-CM

## 2013-12-14 LAB — CBC
HCT: 35.3 % — ABNORMAL LOW (ref 36.0–46.0)
MCHC: 34.3 g/dL (ref 30.0–36.0)
Platelets: 210 10*3/uL (ref 150–400)
RDW: 13.5 % (ref 11.5–15.5)
WBC: 4.6 10*3/uL (ref 4.0–10.5)

## 2013-12-14 LAB — INFLUENZA PANEL BY PCR (TYPE A & B)
H1N1 flu by pcr: NOT DETECTED
Influenza A By PCR: POSITIVE — AB
Influenza B By PCR: NEGATIVE

## 2013-12-14 LAB — MRSA PCR SCREENING: MRSA by PCR: NEGATIVE

## 2013-12-14 LAB — COMPREHENSIVE METABOLIC PANEL
ALT: 9 U/L (ref 0–35)
Albumin: 2.9 g/dL — ABNORMAL LOW (ref 3.5–5.2)
Alkaline Phosphatase: 43 U/L (ref 39–117)
BUN: 32 mg/dL — ABNORMAL HIGH (ref 6–23)
CO2: 23 mEq/L (ref 19–32)
Chloride: 104 mEq/L (ref 96–112)
Creatinine, Ser: 1.11 mg/dL — ABNORMAL HIGH (ref 0.50–1.10)
GFR calc Af Amer: 56 mL/min — ABNORMAL LOW (ref >90)
GFR calc non Af Amer: 48 mL/min — ABNORMAL LOW (ref >90)
Glucose, Bld: 104 mg/dL — ABNORMAL HIGH (ref 70–99)
Potassium: 3.4 mEq/L — ABNORMAL LOW (ref 3.5–5.1)
Sodium: 136 mEq/L (ref 135–145)
Total Bilirubin: 0.2 mg/dL — ABNORMAL LOW (ref 0.3–1.2)

## 2013-12-14 LAB — TSH: TSH: 1.727 u[IU]/mL (ref 0.350–4.500)

## 2013-12-14 LAB — PHOSPHORUS: Phosphorus: 3.2 mg/dL (ref 2.3–4.6)

## 2013-12-14 MED ORDER — ONDANSETRON HCL 4 MG/2ML IJ SOLN
4.0000 mg | Freq: Four times a day (QID) | INTRAMUSCULAR | Status: DC | PRN
  Administered 2013-12-14: 4 mg via INTRAVENOUS
  Filled 2013-12-14: qty 2

## 2013-12-14 MED ORDER — DEXTROSE 5 % IV SOLN: 500.0000 mg | INTRAVENOUS | Status: DC

## 2013-12-14 MED ORDER — AZITHROMYCIN 250 MG PO TABS: 250.0000 mg | ORAL_TABLET | ORAL | Status: DC

## 2013-12-14 MED ORDER — ALBUTEROL SULFATE (5 MG/ML) 0.5% IN NEBU: 2.5000 mg | INHALATION_SOLUTION | RESPIRATORY_TRACT | Status: DC | PRN

## 2013-12-14 MED ORDER — PANTOPRAZOLE SODIUM 40 MG PO TBEC
40.0000 mg | DELAYED_RELEASE_TABLET | Freq: Every day | ORAL | Status: DC
  Administered 2013-12-14 – 2013-12-15 (×2): 40 mg via ORAL
  Filled 2013-12-14 (×3): qty 1

## 2013-12-14 MED ORDER — OSELTAMIVIR PHOSPHATE 30 MG PO CAPS
30.0000 mg | ORAL_CAPSULE | Freq: Two times a day (BID) | ORAL | Status: DC
  Administered 2013-12-14 – 2013-12-15 (×4): 30 mg via ORAL
  Filled 2013-12-14 (×6): qty 1

## 2013-12-14 MED ORDER — SODIUM CHLORIDE 0.9 % IV SOLN
INTRAVENOUS | Status: DC
  Administered 2013-12-14 – 2013-12-15 (×3): via INTRAVENOUS

## 2013-12-14 MED ORDER — HYDROCODONE-ACETAMINOPHEN 5-325 MG PO TABS
1.0000 | ORAL_TABLET | ORAL | Status: DC | PRN
  Administered 2013-12-14: 1 via ORAL
  Filled 2013-12-14: qty 1

## 2013-12-14 MED ORDER — ACETAMINOPHEN 650 MG RE SUPP: 650.0000 mg | Freq: Four times a day (QID) | RECTAL | Status: DC | PRN

## 2013-12-14 MED ORDER — POTASSIUM CHLORIDE CRYS ER 20 MEQ PO TBCR
20.0000 meq | EXTENDED_RELEASE_TABLET | Freq: Once | ORAL | Status: AC
  Administered 2013-12-14: 20 meq via ORAL
  Filled 2013-12-14: qty 1

## 2013-12-14 MED ORDER — ACETAMINOPHEN 325 MG PO TABS
650.0000 mg | ORAL_TABLET | Freq: Four times a day (QID) | ORAL | Status: DC | PRN
  Administered 2013-12-14 (×2): 650 mg via ORAL
  Filled 2013-12-14 (×2): qty 2

## 2013-12-14 MED ORDER — ENOXAPARIN SODIUM 40 MG/0.4ML ~~LOC~~ SOLN
40.0000 mg | SUBCUTANEOUS | Status: DC
  Administered 2013-12-14: 40 mg via SUBCUTANEOUS
  Filled 2013-12-14 (×3): qty 0.4

## 2013-12-14 MED ORDER — SODIUM CHLORIDE 0.9 % IV SOLN
INTRAVENOUS | Status: DC
  Administered 2013-12-14: 01:00:00 via INTRAVENOUS

## 2013-12-14 MED ORDER — GUAIFENESIN ER 600 MG PO TB12
600.0000 mg | ORAL_TABLET | Freq: Two times a day (BID) | ORAL | Status: DC
  Administered 2013-12-14 – 2013-12-15 (×4): 600 mg via ORAL
  Filled 2013-12-14 (×6): qty 1

## 2013-12-14 MED ORDER — OSELTAMIVIR PHOSPHATE 75 MG PO CAPS: 75.0000 mg | ORAL_CAPSULE | Freq: Two times a day (BID) | ORAL | Status: DC

## 2013-12-14 MED ORDER — GUAIFENESIN-DM 100-10 MG/5ML PO SYRP
5.0000 mL | ORAL_SOLUTION | ORAL | Status: DC | PRN
  Administered 2013-12-14: 5 mL via ORAL
  Filled 2013-12-14: qty 10

## 2013-12-14 MED ORDER — ONDANSETRON HCL 4 MG PO TABS: 4.0000 mg | ORAL_TABLET | Freq: Four times a day (QID) | ORAL | Status: DC | PRN

## 2013-12-14 MED ORDER — ASPIRIN 81 MG PO CHEW
81.0000 mg | CHEWABLE_TABLET | Freq: Every morning | ORAL | Status: DC
  Administered 2013-12-14 – 2013-12-15 (×2): 81 mg via ORAL
  Filled 2013-12-14 (×3): qty 1

## 2013-12-14 MED ORDER — DOCUSATE SODIUM 100 MG PO CAPS
100.0000 mg | ORAL_CAPSULE | Freq: Two times a day (BID) | ORAL | Status: DC
  Administered 2013-12-14 – 2013-12-15 (×2): 100 mg via ORAL
  Filled 2013-12-14 (×6): qty 1

## 2013-12-14 MED ORDER — SODIUM CHLORIDE 0.9 % IJ SOLN
3.0000 mL | Freq: Two times a day (BID) | INTRAMUSCULAR | Status: DC
  Administered 2013-12-14 – 2013-12-15 (×2): 3 mL via INTRAVENOUS

## 2013-12-14 MED ORDER — ATORVASTATIN CALCIUM 10 MG PO TABS
10.0000 mg | ORAL_TABLET | Freq: Every day | ORAL | Status: DC
  Administered 2013-12-14 – 2013-12-15 (×2): 10 mg via ORAL
  Filled 2013-12-14 (×3): qty 1

## 2013-12-14 MED ORDER — DEXTROSE 5 % IV SOLN
1.0000 g | INTRAVENOUS | Status: DC
  Administered 2013-12-14 – 2013-12-16 (×3): 1 g via INTRAVENOUS
  Filled 2013-12-14 (×3): qty 10

## 2013-12-14 NOTE — Progress Notes (Signed)
Report called to Shon, RN on 5E for transfer to [phone]

## 2013-12-14 NOTE — Progress Notes (Signed)
Pt seen and examined, admitted this am by Dr.Doutova 72/F with cough, congestion and weakness, likely bronchitis vs influenza -Continue IVF -ROcephin/zithromax Fu FLU PCR Ambulate Transfer to floor Fu with PCP/Pulm outpatient for CT chest ?subtle nodularity in LLL  Zannie Cove, MD [phone]

## 2013-12-15 DIAGNOSIS — I959 Hypotension, unspecified: Secondary | ICD-10-CM | POA: Diagnosis not present

## 2013-12-15 DIAGNOSIS — J101 Influenza due to other identified influenza virus with other respiratory manifestations: Secondary | ICD-10-CM | POA: Diagnosis present

## 2013-12-15 DIAGNOSIS — N179 Acute kidney failure, unspecified: Secondary | ICD-10-CM | POA: Diagnosis not present

## 2013-12-15 DIAGNOSIS — J40 Bronchitis, not specified as acute or chronic: Secondary | ICD-10-CM | POA: Diagnosis not present

## 2013-12-15 DIAGNOSIS — E860 Dehydration: Secondary | ICD-10-CM | POA: Diagnosis not present

## 2013-12-15 LAB — SODIUM, URINE, RANDOM: Sodium, Ur: 96 mEq/L

## 2013-12-15 LAB — CBC
HCT: 36 % (ref 36.0–46.0)
MCH: 29.1 pg (ref 26.0–34.0)
MCHC: 33.9 g/dL (ref 30.0–36.0)
Platelets: 215 10*3/uL (ref 150–400)
RDW: 13.3 % (ref 11.5–15.5)
WBC: 4.3 10*3/uL (ref 4.0–10.5)

## 2013-12-15 MED ORDER — IRBESARTAN 150 MG PO TABS
150.0000 mg | ORAL_TABLET | Freq: Every day | ORAL | Status: DC
  Administered 2013-12-15: 150 mg via ORAL
  Filled 2013-12-15 (×2): qty 1

## 2013-12-15 MED ORDER — AMLODIPINE BESYLATE 5 MG PO TABS
5.0000 mg | ORAL_TABLET | Freq: Every day | ORAL | Status: DC
  Administered 2013-12-15: 5 mg via ORAL
  Filled 2013-12-15 (×2): qty 1

## 2013-12-15 NOTE — Progress Notes (Signed)
Utilization review completed.  

## 2013-12-15 NOTE — Progress Notes (Signed)
[facility] PROGRESS NOTE  FALYN RUBEL XBJ:[id number] DOB: [birth year] DOA: 12/13/2013 PCP: No primary provider on file.  Assessment/Plan: 1. Influenza A -continue TAmiflu, IVF -continue ROcephin today -supportive care  2. AKI -due to dehydration and viral syndrome, hydrate  3. Hypotension -resolved, hydrate -resume antihypertensives  4. ?lung nodule vs irregularity -Fu Ct chest in 3-4weeks as outpatient  DVT proph: lovenox  Code Status: Full COde Family Communication:none at bedside Disposition Plan: home tomorrow   Antibiotics:  ceftriaxone  HPI/Subjective: Feels better, still weak  Objective: Filed Vitals:   12/15/13 1326  BP: 132/60  Pulse: 79  Temp: 98.3 F (36.8 C)  Resp: 18    Intake/Output Summary (Last 24 hours) at 12/15/13 1532 Last data filed at 12/15/13 1501  Gross per 24 hour  Intake 2576.25 ml  Output     12 ml  Net 2564.25 ml   Filed Weights   12/13/13 2245 12/14/13 0400 12/14/13 1236  Weight: 67.8 kg (149 lb 7.6 oz) 66.1 kg (145 lb 11.6 oz) 70.7 kg (155 lb 13.8 oz)    Exam:   General:AAOx3, no distress, ill appearing  Cardiovascular: S1S2/RRR  Respiratory: CTAB  Abdomen: soft, NT, BS present  Musculoskeletal: no edema c/c   Data Reviewed: Basic Metabolic Panel:  Recent Labs Lab 12/13/13 1340 12/14/13 0356  NA 135 136  K 3.4* 3.4*  CL 95* 104  CO2 24 23  GLUCOSE 171* 104*  BUN 30* 32*  CREATININE 1.50* 1.11*  CALCIUM 9.4 8.7  MG  --  1.5  PHOS  --  3.2   Liver Function Tests:  Recent Labs Lab 12/13/13 1340 12/14/13 0356  AST 23 18  ALT 12 9  ALKPHOS 51 43  BILITOT 0.3 0.2*  PROT 7.4 6.0  ALBUMIN 3.6 2.9*   No results found for this basename: LIPASE, AMYLASE,  in the last 168 hours No results found for this basename: AMMONIA,  in the last 168 hours CBC:  Recent Labs Lab 12/13/13 1340 12/14/13 0356 12/15/13 0545  WBC 4.7 4.6 4.3  NEUTROABS 2.7  --   --   HGB 13.8 12.1 12.2  HCT 41.1  35.3* 36.0  MCV 86.9 84.9 85.9  PLT 245 210 215   Cardiac Enzymes: No results found for this basename: CKTOTAL, CKMB, CKMBINDEX, TROPONINI,  in the last 168 hours BNP (last 3 results) No results found for this basename: PROBNP,  in the last 8760 hours CBG: No results found for this basename: GLUCAP,  in the last 168 hours  Recent Results (from the past 240 hour(s))  CULTURE, BLOOD (ROUTINE X 2)     Status: None   Collection Time    12/13/13 12:01 AM      Result Value Range Status   Specimen Description BLOOD LEFT ARM   Final   Special Requests NONE   Final   Culture  Setup Time     Final   Value: 12/14/2013 14:30     Performed at [facility]   Culture     Final   Value:        BLOOD CULTURE RECEIVED NO GROWTH TO DATE CULTURE WILL BE HELD FOR 5 DAYS BEFORE ISSUING A FINAL NEGATIVE REPORT     Performed at [facility]   Report Status PENDING   Incomplete  CULTURE, BLOOD (ROUTINE X 2)     Status: None   Collection Time    12/13/13 12:07 AM      Result Value Range  Status   Specimen Description BLOOD LEFT HAND   Final   Special Requests NONE   Final   Culture  Setup Time     Final   Value: 12/14/2013 14:30     Performed at [facility]   Culture     Final   Value:        BLOOD CULTURE RECEIVED NO GROWTH TO DATE CULTURE WILL BE HELD FOR 5 DAYS BEFORE ISSUING A FINAL NEGATIVE REPORT     Performed at [facility]   Report Status PENDING   Incomplete  MRSA PCR SCREENING     Status: None   Collection Time    12/13/13 10:46 PM      Result Value Range Status   MRSA by PCR NEGATIVE  NEGATIVE Final   Comment:            The GeneXpert MRSA Assay (FDA     approved for NASAL specimens     only), is one component of a     comprehensive MRSA colonization     surveillance program. It is not     intended to diagnose MRSA     infection nor to guide or     monitor treatment for     MRSA infections.     Studies: No results found.  Scheduled Meds: .  amLODipine  5 mg Oral Daily  . aspirin  81 mg Oral q morning - 10a  . atorvastatin  10 mg Oral q1800  . cefTRIAXone (ROCEPHIN)  IV  1 g Intravenous Q24H  . docusate sodium  100 mg Oral BID  . enoxaparin (LOVENOX) injection  40 mg Subcutaneous Q24H  . guaiFENesin  600 mg Oral BID  . irbesartan  150 mg Oral Daily  . oseltamivir  30 mg Oral BID  . pantoprazole  40 mg Oral Daily  . sodium chloride  3 mL Intravenous Q12H   Continuous Infusions: . sodium chloride 75 mL/hr at 12/15/13 0419    Active Problems:   Hypotension, unspecified   Dehydration   Acute renal failure    Time spent:    [facility]  [facility] Pager [phone]. If 7PM-7AM, please contact night-coverage at [email or web address], password [facility] 12/15/2013, 3:32 PM  LOS: 2 days

## 2013-12-15 NOTE — Care Management Note (Signed)
    Page 1 of 1   12/15/2013     4:29:15 PM   CARE MANAGEMENT NOTE 12/15/2013  Patient:  Mcdevitt,Jaysha S   Account Number:  [id number]  Date Initiated:  12/15/2013  Documentation initiated by:  Colleen Can  Subjective/Objective Assessment:   dx viral syndrome, dehydration     Action/Plan:   Home with spouse upon discharge. No HH needs assessed at this time.   Anticipated DC Date:  12/16/2013   Anticipated DC Plan:  HOME/SELF CARE      DC Planning Services  CM consult      Choice offered to / List presented to:             Status of service:  Completed, signed off Medicare Important Message given?   (If response is "NO", the following Medicare IM given date fields will be blank) Date Medicare IM given:   Date Additional Medicare IM given:    Discharge Disposition:    Per UR Regulation:    If discussed at Long Length of Stay Meetings, dates discussed:    Comments:

## 2013-12-16 DIAGNOSIS — R911 Solitary pulmonary nodule: Secondary | ICD-10-CM | POA: Diagnosis not present

## 2013-12-16 DIAGNOSIS — J111 Influenza due to unidentified influenza virus with other respiratory manifestations: Secondary | ICD-10-CM | POA: Diagnosis not present

## 2013-12-16 DIAGNOSIS — I959 Hypotension, unspecified: Secondary | ICD-10-CM | POA: Diagnosis not present

## 2013-12-16 DIAGNOSIS — J40 Bronchitis, not specified as acute or chronic: Secondary | ICD-10-CM | POA: Diagnosis not present

## 2013-12-16 MED ORDER — OSELTAMIVIR PHOSPHATE 30 MG PO CAPS: 30.0000 mg | ORAL_CAPSULE | Freq: Two times a day (BID) | ORAL | Status: DC

## 2013-12-16 NOTE — Progress Notes (Signed)
Patient given discharge instructions, with a verbalized understanding of all instructions.  Patient being discharged in stable medical condtion.  Patient agreeable to discharge plan, and will get ride with son.  Philomena Doheny RN

## 2013-12-16 NOTE — Discharge Summary (Signed)
Physician Discharge Summary  Tina Ellis:[id number] DOB: 1941-08-23 DOA: 12/13/2013  PCP: Dr.Pharr  Admit date: 12/13/2013 Discharge date: 12/16/2013  Time spent:45 minutes  Recommendations for Outpatient Follow-up:  PCP in 1 week CT chest in 4-5weeks to FU Lung nodularity  Discharge Diagnoses:     Influenza A   Hypotension, unspecified   Dehydration   Acute renal failure    Discharge Condition: improved  Diet recommendation:low sodium  Filed Weights   12/13/13 2245 12/14/13 0400 12/14/13 1236  Weight: 67.8 kg (149 lb 7.6 oz) 66.1 kg (145 lb 11.6 oz) 70.7 kg (155 lb 13.8 oz)    History of present illness:  Tina Ellis is a 72 y.o. female  has a past medical history of Hypertension; Hypercholesteremia; Seasonal allergies; and GERD (gastroesophageal reflux disease).  Presented with  Reports her grandson had a cold in the beginning of the week then her husband and finally 5 days ago she started to have cough with sore ribs, fever up to 102. She has not been able to eat much. She called into office and got prescribtion for Zpack. She presented to [facility] and was found to be hypotensive down to 80/40 and orthostatic. After getting IVF she is improved. Due to hypotension patient is being transferred to [facility] stepdown. Blood cultures pending.   Hospital Course:  1. Influenza A -Influenza PCR was positive, was initially treated with IV ceftriaxone and azithromycin which were discontinued -started on TAmiflu  -improving slowly  -needs rest and plenty of fluids for next few days   2. AKI  -due to dehydration and viral syndrome -improved with hydration   3. Hypotension  -resolved, hydrate  -resumed diovan HCT -held Norvasc till FU with PCP  4. Possible lung nodule vs irregularity  -Fu Ct chest in 4-5 weeks as outpatient    Discharge Exam: Filed Vitals:   12/16/13 0548  BP: 154/77  Pulse: 73  Temp: 98.4 F (36.9 C)  Resp: 16    General:  AAOx3 Cardiovascular: S1S2/RRR Respiratory: CTAB  Discharge Instructions  Discharge Orders   Future Orders Complete By Expires   Diet - low sodium heart healthy  As directed    Increase activity slowly  As directed        Medication List    STOP taking these medications       amLODipine 5 MG tablet  Commonly known as:  NORVASC     azithromycin 250 MG tablet  Commonly known as:  ZITHROMAX      TAKE these medications       acetaminophen 500 MG tablet  Commonly known as:  TYLENOL  Take 500 mg by mouth every 6 (six) hours as needed for mild pain or fever.     aspirin 81 MG tablet  Take 81 mg by mouth every morning.     cholecalciferol 1000 UNITS tablet  Commonly known as:  VITAMIN D  Take 2,000 Units by mouth every morning.     loratadine 10 MG tablet  Commonly known as:  CLARITIN  Take 10 mg by mouth every morning.     omeprazole 20 MG capsule  Commonly known as:  PRILOSEC  Take 20 mg by mouth every morning.     oseltamivir 30 MG capsule  Commonly known as:  TAMIFLU  Take 1 capsule (30 mg total) by mouth 2 (two) times daily. For 3 days     rosuvastatin 5 MG tablet  Commonly known as:  CRESTOR  Take 2.5 mg by  mouth every evening.     valsartan-hydrochlorothiazide 160-25 MG per tablet  Commonly known as:  DIOVAN-HCT  Take 1 tablet by mouth daily.       No Known Allergies     Follow-up Information   Follow up with Londell Moh, MD. Schedule an appointment as soon as possible for a visit in 1 week. (YOU NEED TO HAVE A CT SCAN OF YOUR CHEST TO BETTER ASSESS A LUNG NODULE)    Specialty:  Internal Medicine   Contact information:   [address] [place] [address] [phone]        The results of significant diagnostics from this hospitalization (including imaging, microbiology, ancillary and laboratory) are listed below for reference.    Significant Diagnostic Studies: Dg Chest 2 View  12/13/2013   CLINICAL DATA:   Cough fever and weakness for the preceding 4 days  EXAM: CHEST  2 VIEW  COMPARISON:  Uppermost images of a CT scan of the abdomen dated May 23, 2013 and a PA and lateral chest x-ray dated August 05, 2009.  FINDINGS: The lungs are hyperinflated with hemidiaphragm flattening. There is no focal infiltrate. There is subtle nodularity that projects between the posterior aspects of the left 9th and 10th ribs. This may lie in the retrocardiac region on the lateral film. The cardiopericardial silhouette is normal in size. The pulmonary vascularity is not engorged. The mediastinum is normal in width. There is no pleural effusion or pneumothorax. The observed portions of the bony thorax appear normal.  IMPRESSION: 1. 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 There is no evidence of pneumonia nor CHF. 2. There is new subtle nodularity and may lie in the left lower lobe. Follow-up chest CT scanning is recommended to evaluate the lung parenchymal more completely.   Electronically Signed   By: David  [place]   On: 12/13/2013 13:59    Microbiology: Recent Results (from the past 240 hour(s))  CULTURE, BLOOD (ROUTINE X 2)     Status: None   Collection Time    12/13/13 12:01 AM      Result Value Range Status   Specimen Description BLOOD LEFT ARM   Final   Special Requests NONE   Final   Culture  Setup Time     Final   Value: 12/14/2013 14:30     Performed at [facility]   Culture     Final   Value:        BLOOD CULTURE RECEIVED NO GROWTH TO DATE CULTURE WILL BE HELD FOR 5 DAYS BEFORE ISSUING A FINAL NEGATIVE REPORT     Performed at [facility]   Report Status PENDING   Incomplete  CULTURE, BLOOD (ROUTINE X 2)     Status: None   Collection Time    12/13/13 12:07 AM      Result Value Range Status   Specimen Description BLOOD LEFT HAND   Final   Special Requests NONE   Final   Culture  Setup Time     Final   Value: 12/14/2013 14:30     Performed at [facility]   Culture     Final    Value:        BLOOD CULTURE RECEIVED NO GROWTH TO DATE CULTURE WILL BE HELD FOR 5 DAYS BEFORE ISSUING A FINAL NEGATIVE REPORT     Performed at [facility]   Report Status PENDING   Incomplete  MRSA PCR SCREENING     Status:  None   Collection Time    12/13/13 10:46 PM      Result Value Range Status   MRSA by PCR NEGATIVE  NEGATIVE Final   Comment:            The GeneXpert MRSA Assay (FDA     approved for NASAL specimens     only), is one component of a     comprehensive MRSA colonization     surveillance program. It is not     intended to diagnose MRSA     infection nor to guide or     monitor treatment for     MRSA infections.     Labs: Basic Metabolic Panel:  Recent Labs Lab 12/13/13 1340 12/14/13 0356  NA 135 136  K 3.4* 3.4*  CL 95* 104  CO2 24 23  GLUCOSE 171* 104*  BUN 30* 32*  CREATININE 1.50* 1.11*  CALCIUM 9.4 8.7  MG  --  1.5  PHOS  --  3.2   Liver Function Tests:  Recent Labs Lab 12/13/13 1340 12/14/13 0356  AST 23 18  ALT 12 9  ALKPHOS 51 43  BILITOT 0.3 0.2*  PROT 7.4 6.0  ALBUMIN 3.6 2.9*   No results found for this basename: LIPASE, AMYLASE,  in the last 168 hours No results found for this basename: AMMONIA,  in the last 168 hours CBC:  Recent Labs Lab 12/13/13 1340 12/14/13 0356 12/15/13 0545  WBC 4.7 4.6 4.3  NEUTROABS 2.7  --   --   HGB 13.8 12.1 12.2  HCT 41.1 35.3* 36.0  MCV 86.9 84.9 85.9  PLT 245 210 215   Cardiac Enzymes: No results found for this basename: CKTOTAL, CKMB, CKMBINDEX, TROPONINI,  in the last 168 hours BNP: BNP (last 3 results) No results found for this basename: PROBNP,  in the last 8760 hours CBG: No results found for this basename: GLUCAP,  in the last 168 hours     Signed:  Rodgerick Gilliand  [facility] 12/16/2013, 8:58 AM

## 2013-12-20 LAB — CULTURE, BLOOD (ROUTINE X 2): Culture: NO GROWTH

## 2013-12-24 ENCOUNTER — Other Ambulatory Visit: Payer: Self-pay | Admitting: Internal Medicine

## 2013-12-24 DIAGNOSIS — J111 Influenza due to unidentified influenza virus with other respiratory manifestations: Secondary | ICD-10-CM | POA: Diagnosis not present

## 2013-12-24 DIAGNOSIS — E876 Hypokalemia: Secondary | ICD-10-CM | POA: Diagnosis not present

## 2013-12-24 DIAGNOSIS — R9389 Abnormal findings on diagnostic imaging of other specified body structures: Secondary | ICD-10-CM

## 2013-12-24 DIAGNOSIS — R918 Other nonspecific abnormal finding of lung field: Secondary | ICD-10-CM | POA: Diagnosis not present

## 2014-01-02 ENCOUNTER — Other Ambulatory Visit: Payer: Self-pay | Admitting: Internal Medicine

## 2014-01-02 ENCOUNTER — Other Ambulatory Visit: Payer: Medicare Other

## 2014-01-02 ENCOUNTER — Ambulatory Visit
Admission: RE | Admit: 2014-01-02 | Discharge: 2014-01-02 | Disposition: A | Discharge status: Home / Self Care | Payer: Medicare Other | Source: Ambulatory Visit | Attending: Internal Medicine | Admitting: Internal Medicine

## 2014-01-02 DIAGNOSIS — R9389 Abnormal findings on diagnostic imaging of other specified body structures: Secondary | ICD-10-CM

## 2014-01-02 DIAGNOSIS — J984 Other disorders of lung: Secondary | ICD-10-CM | POA: Diagnosis not present

## 2014-01-29 DIAGNOSIS — H52229 Regular astigmatism, unspecified eye: Secondary | ICD-10-CM | POA: Diagnosis not present

## 2014-01-29 DIAGNOSIS — H524 Presbyopia: Secondary | ICD-10-CM | POA: Diagnosis not present

## 2014-01-29 DIAGNOSIS — H40019 Open angle with borderline findings, low risk, unspecified eye: Secondary | ICD-10-CM | POA: Diagnosis not present

## 2014-01-29 DIAGNOSIS — H5200 Hypermetropia, unspecified eye: Secondary | ICD-10-CM | POA: Diagnosis not present

## 2014-02-13 DIAGNOSIS — S99929A Unspecified injury of unspecified foot, initial encounter: Secondary | ICD-10-CM | POA: Diagnosis not present

## 2014-02-13 DIAGNOSIS — S8990XA Unspecified injury of unspecified lower leg, initial encounter: Secondary | ICD-10-CM | POA: Diagnosis not present

## 2014-02-13 DIAGNOSIS — S99919A Unspecified injury of unspecified ankle, initial encounter: Secondary | ICD-10-CM | POA: Diagnosis not present

## 2014-02-13 DIAGNOSIS — M25569 Pain in unspecified knee: Secondary | ICD-10-CM | POA: Diagnosis not present

## 2014-04-14 DIAGNOSIS — M949 Disorder of cartilage, unspecified: Secondary | ICD-10-CM | POA: Diagnosis not present

## 2014-04-14 DIAGNOSIS — E782 Mixed hyperlipidemia: Secondary | ICD-10-CM | POA: Diagnosis not present

## 2014-04-14 DIAGNOSIS — E7800 Pure hypercholesterolemia, unspecified: Secondary | ICD-10-CM | POA: Diagnosis not present

## 2014-04-14 DIAGNOSIS — I10 Essential (primary) hypertension: Secondary | ICD-10-CM | POA: Diagnosis not present

## 2014-04-14 DIAGNOSIS — Z0000 Encounter for general adult medical examination without abnormal findings: Secondary | ICD-10-CM | POA: Diagnosis not present

## 2014-04-14 DIAGNOSIS — E559 Vitamin D deficiency, unspecified: Secondary | ICD-10-CM | POA: Diagnosis not present

## 2014-04-14 DIAGNOSIS — M899 Disorder of bone, unspecified: Secondary | ICD-10-CM | POA: Diagnosis not present

## 2014-04-14 DIAGNOSIS — Z23 Encounter for immunization: Secondary | ICD-10-CM | POA: Diagnosis not present

## 2014-04-22 DIAGNOSIS — I10 Essential (primary) hypertension: Secondary | ICD-10-CM | POA: Diagnosis not present

## 2014-04-22 DIAGNOSIS — K219 Gastro-esophageal reflux disease without esophagitis: Secondary | ICD-10-CM | POA: Diagnosis not present

## 2014-04-22 DIAGNOSIS — R3129 Other microscopic hematuria: Secondary | ICD-10-CM | POA: Diagnosis not present

## 2014-04-22 DIAGNOSIS — E7800 Pure hypercholesterolemia, unspecified: Secondary | ICD-10-CM | POA: Diagnosis not present

## 2014-05-20 ENCOUNTER — Other Ambulatory Visit: Payer: Self-pay

## 2014-05-20 DIAGNOSIS — Z1231 Encounter for screening mammogram for malignant neoplasm of breast: Secondary | ICD-10-CM

## 2014-05-22 ENCOUNTER — Encounter (INDEPENDENT_AMBULATORY_CARE_PROVIDER_SITE_OTHER): Payer: Self-pay

## 2014-05-22 ENCOUNTER — Ambulatory Visit
Admission: RE | Admit: 2014-05-22 | Discharge: 2014-05-22 | Disposition: A | Discharge status: Home / Self Care | Payer: Medicare Other | Source: Ambulatory Visit

## 2014-05-22 DIAGNOSIS — Z1231 Encounter for screening mammogram for malignant neoplasm of breast: Secondary | ICD-10-CM

## 2014-06-04 DIAGNOSIS — D485 Neoplasm of uncertain behavior of skin: Secondary | ICD-10-CM | POA: Diagnosis not present

## 2014-06-04 DIAGNOSIS — L723 Sebaceous cyst: Secondary | ICD-10-CM | POA: Diagnosis not present

## 2014-06-04 DIAGNOSIS — L821 Other seborrheic keratosis: Secondary | ICD-10-CM | POA: Diagnosis not present

## 2014-06-04 DIAGNOSIS — D235 Other benign neoplasm of skin of trunk: Secondary | ICD-10-CM | POA: Diagnosis not present

## 2014-07-01 DIAGNOSIS — S8000XA Contusion of unspecified knee, initial encounter: Secondary | ICD-10-CM | POA: Diagnosis not present

## 2014-07-09 DIAGNOSIS — M25569 Pain in unspecified knee: Secondary | ICD-10-CM | POA: Diagnosis not present

## 2014-07-09 DIAGNOSIS — M545 Low back pain, unspecified: Secondary | ICD-10-CM | POA: Diagnosis not present

## 2014-09-11 ENCOUNTER — Other Ambulatory Visit: Payer: Self-pay | Admitting: Internal Medicine

## 2014-09-11 DIAGNOSIS — R1084 Generalized abdominal pain: Secondary | ICD-10-CM

## 2014-09-11 DIAGNOSIS — M25579 Pain in unspecified ankle and joints of unspecified foot: Secondary | ICD-10-CM | POA: Diagnosis not present

## 2014-09-11 DIAGNOSIS — Z8249 Family history of ischemic heart disease and other diseases of the circulatory system: Secondary | ICD-10-CM | POA: Diagnosis not present

## 2014-09-22 ENCOUNTER — Ambulatory Visit
Admission: RE | Admit: 2014-09-22 | Discharge: 2014-09-22 | Disposition: A | Discharge status: Home / Self Care | Payer: Medicare Other | Source: Ambulatory Visit | Attending: Internal Medicine | Admitting: Internal Medicine

## 2014-09-22 DIAGNOSIS — R1084 Generalized abdominal pain: Secondary | ICD-10-CM

## 2014-09-22 DIAGNOSIS — Z9089 Acquired absence of other organs: Secondary | ICD-10-CM | POA: Diagnosis not present

## 2014-09-22 DIAGNOSIS — R935 Abnormal findings on diagnostic imaging of other abdominal regions, including retroperitoneum: Secondary | ICD-10-CM | POA: Diagnosis not present

## 2014-09-22 DIAGNOSIS — R197 Diarrhea, unspecified: Secondary | ICD-10-CM | POA: Diagnosis not present

## 2014-09-22 DIAGNOSIS — R109 Unspecified abdominal pain: Secondary | ICD-10-CM | POA: Diagnosis not present

## 2014-10-29 DIAGNOSIS — B349 Viral infection, unspecified: Secondary | ICD-10-CM | POA: Diagnosis not present

## 2014-11-05 DIAGNOSIS — Z23 Encounter for immunization: Secondary | ICD-10-CM | POA: Diagnosis not present

## 2015-01-13 DIAGNOSIS — J019 Acute sinusitis, unspecified: Secondary | ICD-10-CM | POA: Diagnosis not present

## 2015-02-18 DIAGNOSIS — H1010 Acute atopic conjunctivitis, unspecified eye: Secondary | ICD-10-CM | POA: Diagnosis not present

## 2015-02-18 DIAGNOSIS — F329 Major depressive disorder, single episode, unspecified: Secondary | ICD-10-CM | POA: Diagnosis not present

## 2015-03-04 DIAGNOSIS — F418 Other specified anxiety disorders: Secondary | ICD-10-CM | POA: Diagnosis not present

## 2015-06-03 DIAGNOSIS — R197 Diarrhea, unspecified: Secondary | ICD-10-CM | POA: Diagnosis not present

## 2015-06-03 DIAGNOSIS — R110 Nausea: Secondary | ICD-10-CM | POA: Diagnosis not present

## 2015-06-04 ENCOUNTER — Other Ambulatory Visit: Payer: Self-pay | Admitting: Internal Medicine

## 2015-06-04 DIAGNOSIS — R911 Solitary pulmonary nodule: Secondary | ICD-10-CM

## 2015-06-11 ENCOUNTER — Ambulatory Visit
Admission: RE | Admit: 2015-06-11 | Discharge: 2015-06-11 | Disposition: A | Discharge status: Home / Self Care | Payer: Medicare Other | Source: Ambulatory Visit | Attending: Internal Medicine | Admitting: Internal Medicine

## 2015-06-11 DIAGNOSIS — R911 Solitary pulmonary nodule: Secondary | ICD-10-CM

## 2015-06-11 DIAGNOSIS — R918 Other nonspecific abnormal finding of lung field: Secondary | ICD-10-CM | POA: Diagnosis not present

## 2015-09-21 ENCOUNTER — Other Ambulatory Visit: Payer: Self-pay

## 2015-09-21 DIAGNOSIS — Z1231 Encounter for screening mammogram for malignant neoplasm of breast: Secondary | ICD-10-CM

## 2015-09-22 ENCOUNTER — Ambulatory Visit
Admission: RE | Admit: 2015-09-22 | Discharge: 2015-09-22 | Disposition: A | Discharge status: Home / Self Care | Payer: Medicare Other | Source: Ambulatory Visit

## 2015-09-22 DIAGNOSIS — Z1231 Encounter for screening mammogram for malignant neoplasm of breast: Secondary | ICD-10-CM

## 2015-11-03 DIAGNOSIS — M8580 Other specified disorders of bone density and structure, unspecified site: Secondary | ICD-10-CM | POA: Diagnosis not present

## 2015-11-03 DIAGNOSIS — E7800 Pure hypercholesterolemia, unspecified: Secondary | ICD-10-CM | POA: Diagnosis not present

## 2015-11-03 DIAGNOSIS — E559 Vitamin D deficiency, unspecified: Secondary | ICD-10-CM | POA: Diagnosis not present

## 2015-11-03 DIAGNOSIS — I10 Essential (primary) hypertension: Secondary | ICD-10-CM | POA: Diagnosis not present

## 2015-11-10 DIAGNOSIS — Z0000 Encounter for general adult medical examination without abnormal findings: Secondary | ICD-10-CM | POA: Diagnosis not present

## 2015-11-10 DIAGNOSIS — E7800 Pure hypercholesterolemia, unspecified: Secondary | ICD-10-CM | POA: Diagnosis not present

## 2015-11-10 DIAGNOSIS — Z23 Encounter for immunization: Secondary | ICD-10-CM | POA: Diagnosis not present

## 2015-11-10 DIAGNOSIS — Z01419 Encounter for gynecological examination (general) (routine) without abnormal findings: Secondary | ICD-10-CM | POA: Diagnosis not present

## 2015-11-10 DIAGNOSIS — I10 Essential (primary) hypertension: Secondary | ICD-10-CM | POA: Diagnosis not present

## 2015-11-20 DIAGNOSIS — M542 Cervicalgia: Secondary | ICD-10-CM | POA: Diagnosis not present

## 2015-11-20 DIAGNOSIS — M546 Pain in thoracic spine: Secondary | ICD-10-CM | POA: Diagnosis not present

## 2015-11-23 DIAGNOSIS — M25512 Pain in left shoulder: Secondary | ICD-10-CM | POA: Diagnosis not present

## 2015-11-23 DIAGNOSIS — M5030 Other cervical disc degeneration, unspecified cervical region: Secondary | ICD-10-CM | POA: Diagnosis not present

## 2016-01-12 ENCOUNTER — Other Ambulatory Visit: Payer: Self-pay | Admitting: Internal Medicine

## 2016-01-12 ENCOUNTER — Ambulatory Visit
Admission: RE | Admit: 2016-01-12 | Discharge: 2016-01-12 | Disposition: A | Discharge status: Home / Self Care | Payer: Medicare Other | Source: Ambulatory Visit | Attending: Internal Medicine | Admitting: Internal Medicine

## 2016-01-12 DIAGNOSIS — R109 Unspecified abdominal pain: Secondary | ICD-10-CM

## 2016-01-12 DIAGNOSIS — K5732 Diverticulitis of large intestine without perforation or abscess without bleeding: Secondary | ICD-10-CM | POA: Diagnosis not present

## 2016-01-12 MED ORDER — IOPAMIDOL (ISOVUE-300) INJECTION 61%
100.0000 mL | Freq: Once | INTRAVENOUS | Status: AC | PRN
  Administered 2016-01-12: 100 mL via INTRAVENOUS

## 2016-01-13 ENCOUNTER — Other Ambulatory Visit: Payer: Self-pay | Admitting: Internal Medicine

## 2016-01-13 DIAGNOSIS — R1084 Generalized abdominal pain: Secondary | ICD-10-CM

## 2016-01-14 DIAGNOSIS — K5792 Diverticulitis of intestine, part unspecified, without perforation or abscess without bleeding: Secondary | ICD-10-CM | POA: Diagnosis not present

## 2016-01-17 DIAGNOSIS — K5792 Diverticulitis of intestine, part unspecified, without perforation or abscess without bleeding: Secondary | ICD-10-CM | POA: Diagnosis not present

## 2016-01-17 DIAGNOSIS — R1030 Lower abdominal pain, unspecified: Secondary | ICD-10-CM | POA: Diagnosis not present

## 2016-02-24 ENCOUNTER — Ambulatory Visit
Admission: RE | Admit: 2016-02-24 | Discharge: 2016-02-24 | Disposition: A | Discharge status: Home / Self Care | Payer: Medicare Other | Source: Ambulatory Visit | Attending: Gastroenterology | Admitting: Gastroenterology

## 2016-02-24 ENCOUNTER — Other Ambulatory Visit: Payer: Self-pay | Admitting: Gastroenterology

## 2016-02-24 DIAGNOSIS — R140 Abdominal distension (gaseous): Secondary | ICD-10-CM

## 2016-03-09 DIAGNOSIS — J069 Acute upper respiratory infection, unspecified: Secondary | ICD-10-CM | POA: Diagnosis not present

## 2016-03-09 DIAGNOSIS — R05 Cough: Secondary | ICD-10-CM | POA: Diagnosis not present

## 2016-03-16 DIAGNOSIS — J069 Acute upper respiratory infection, unspecified: Secondary | ICD-10-CM | POA: Diagnosis not present

## 2016-03-16 DIAGNOSIS — B373 Candidiasis of vulva and vagina: Secondary | ICD-10-CM | POA: Diagnosis not present

## 2016-03-24 DIAGNOSIS — M542 Cervicalgia: Secondary | ICD-10-CM | POA: Diagnosis not present

## 2016-03-24 DIAGNOSIS — M50321 Other cervical disc degeneration at C4-C5 level: Secondary | ICD-10-CM | POA: Diagnosis not present

## 2016-05-09 DIAGNOSIS — E559 Vitamin D deficiency, unspecified: Secondary | ICD-10-CM | POA: Diagnosis not present

## 2016-05-09 DIAGNOSIS — E7800 Pure hypercholesterolemia, unspecified: Secondary | ICD-10-CM | POA: Diagnosis not present

## 2016-05-09 DIAGNOSIS — J069 Acute upper respiratory infection, unspecified: Secondary | ICD-10-CM | POA: Diagnosis not present

## 2016-05-09 DIAGNOSIS — Z79899 Other long term (current) drug therapy: Secondary | ICD-10-CM | POA: Diagnosis not present

## 2016-05-09 DIAGNOSIS — R739 Hyperglycemia, unspecified: Secondary | ICD-10-CM | POA: Diagnosis not present

## 2016-05-26 DIAGNOSIS — H52223 Regular astigmatism, bilateral: Secondary | ICD-10-CM | POA: Diagnosis not present

## 2016-05-26 DIAGNOSIS — H1045 Other chronic allergic conjunctivitis: Secondary | ICD-10-CM | POA: Diagnosis not present

## 2016-05-26 DIAGNOSIS — H2513 Age-related nuclear cataract, bilateral: Secondary | ICD-10-CM | POA: Diagnosis not present

## 2016-05-26 DIAGNOSIS — H5203 Hypermetropia, bilateral: Secondary | ICD-10-CM | POA: Diagnosis not present

## 2016-09-28 DIAGNOSIS — R197 Diarrhea, unspecified: Secondary | ICD-10-CM | POA: Diagnosis not present

## 2016-09-29 DIAGNOSIS — R197 Diarrhea, unspecified: Secondary | ICD-10-CM | POA: Diagnosis not present

## 2016-10-05 DIAGNOSIS — Z23 Encounter for immunization: Secondary | ICD-10-CM | POA: Diagnosis not present

## 2016-11-13 DIAGNOSIS — J329 Chronic sinusitis, unspecified: Secondary | ICD-10-CM | POA: Diagnosis not present

## 2017-02-23 ENCOUNTER — Encounter (HOSPITAL_COMMUNITY): Payer: Self-pay | Admitting: Emergency Medicine

## 2017-02-23 DIAGNOSIS — H538 Other visual disturbances: Secondary | ICD-10-CM | POA: Diagnosis not present

## 2017-02-23 DIAGNOSIS — Z79899 Other long term (current) drug therapy: Secondary | ICD-10-CM | POA: Diagnosis not present

## 2017-02-23 DIAGNOSIS — I10 Essential (primary) hypertension: Secondary | ICD-10-CM | POA: Diagnosis not present

## 2017-02-23 DIAGNOSIS — R5383 Other fatigue: Secondary | ICD-10-CM | POA: Insufficient documentation

## 2017-02-23 DIAGNOSIS — Z7982 Long term (current) use of aspirin: Secondary | ICD-10-CM | POA: Insufficient documentation

## 2017-02-23 NOTE — ED Triage Notes (Signed)
Patient complaining of high blood pressure around 2200. Patient states she woke up and could not see when she woke up. Patient face was flushed and her blood pressure was high. Patient states she took another blood pressure pill while EMS was at her house. Patient also is complaining of right flank pain. She states that it feels like something is moving.

## 2017-02-24 ENCOUNTER — Emergency Department (HOSPITAL_COMMUNITY)
Admission: EM | Admit: 2017-02-24 | Discharge: 2017-02-24 | Disposition: A | Discharge status: Home / Self Care | Payer: Medicare Other | Attending: Emergency Medicine | Admitting: Emergency Medicine

## 2017-02-24 DIAGNOSIS — H538 Other visual disturbances: Secondary | ICD-10-CM

## 2017-02-24 DIAGNOSIS — I10 Essential (primary) hypertension: Secondary | ICD-10-CM | POA: Diagnosis not present

## 2017-02-24 DIAGNOSIS — R5383 Other fatigue: Secondary | ICD-10-CM

## 2017-02-24 LAB — CBC WITH DIFFERENTIAL/PLATELET
BASOS ABS: 0.1 10*3/uL (ref 0.0–0.1)
Basophils Relative: 2 %
EOS ABS: 0.8 10*3/uL — AB (ref 0.0–0.7)
EOS PCT: 10 %
HCT: 38.7 % (ref 36.0–46.0)
Hemoglobin: 13.3 g/dL (ref 12.0–15.0)
LYMPHS ABS: 2 10*3/uL (ref 0.7–4.0)
Lymphocytes Relative: 26 %
MCH: 29.6 pg (ref 26.0–34.0)
MCHC: 34.4 g/dL (ref 30.0–36.0)
MCV: 86.2 fL (ref 78.0–100.0)
Monocytes Absolute: 0.7 10*3/uL (ref 0.1–1.0)
Monocytes Relative: 9 %
Neutro Abs: 4.3 10*3/uL (ref 1.7–7.7)
Neutrophils Relative %: 53 %
PLATELETS: 298 10*3/uL (ref 150–400)
RBC: 4.49 MIL/uL (ref 3.87–5.11)
RDW: 13.7 % (ref 11.5–15.5)
WBC: 7.9 10*3/uL (ref 4.0–10.5)

## 2017-02-24 LAB — COMPREHENSIVE METABOLIC PANEL
ALT: 14 U/L (ref 14–54)
ANION GAP: 8 (ref 5–15)
AST: 22 U/L (ref 15–41)
Albumin: 3.9 g/dL (ref 3.5–5.0)
Alkaline Phosphatase: 50 U/L (ref 38–126)
BUN: 26 mg/dL — ABNORMAL HIGH (ref 6–20)
CHLORIDE: 105 mmol/L (ref 101–111)
CO2: 27 mmol/L (ref 22–32)
CREATININE: 1.14 mg/dL — AB (ref 0.44–1.00)
Calcium: 9.6 mg/dL (ref 8.9–10.3)
GFR calc non Af Amer: 46 mL/min — ABNORMAL LOW (ref >60)
GFR, EST AFRICAN AMERICAN: 53 mL/min — AB (ref >60)
Glucose, Bld: 135 mg/dL — ABNORMAL HIGH (ref 65–99)
POTASSIUM: 3.6 mmol/L (ref 3.5–5.1)
SODIUM: 140 mmol/L (ref 135–145)
Total Bilirubin: 0.6 mg/dL (ref 0.3–1.2)
Total Protein: 7.3 g/dL (ref 6.5–8.1)

## 2017-02-24 LAB — I-STAT TROPONIN, ED: Troponin i, poc: 0 ng/mL (ref 0.00–0.08)

## 2017-02-24 NOTE — ED Provider Notes (Signed)
[facility] DEPT Provider Note   CSN: [id number] Arrival date & time: 02/23/17  2325  By signing my name below, I, Oleh Genin, attest that this documentation has been prepared under the direction and in the presence of No att. providers found. Electronically Signed: Oleh Genin, Scribe. 02/24/17. 1:47 AM.   History   Chief Complaint Chief Complaint  Patient presents with  . Hypertension  . Flank Pain    HPI MERELIN LAKINS is a 76 y.o. female with history of HTN and HLD who presents to the ED for evaluation of elevated blood pressure. This patient states that approximately 3 hours ago she was sleeping when she woke with mild blurred vision, racing heartbeat, and sweating. She took her blood pressure at this time, found herself hypertensive to 198/100 mm Hg, and called EMS. At interview, she is now reporting a mild headache. When asked, she also reporting intermittent fatigue and dyspnea over the last 2 days. Reporting a dry cough in the recent weeks. Denies any abdominal pain. She does experiencing some L lateral chest pain which presents randomly and is relieved with position. No active chest pain.   The history is provided by the patient. No language interpreter was used.    Past Medical History:  Diagnosis Date  . GERD (gastroesophageal reflux disease)   . Hypercholesteremia   . Hypertension   . Seasonal allergies     Patient Active Problem List   Diagnosis Date Noted  . Influenza A 12/15/2013  . Hypotension 12/14/2013  . Acute renal failure ([place]) 12/14/2013  . UTI (lower urinary tract infection) 12/13/2013  . Hypotension, unspecified 12/13/2013  . Dehydration 12/13/2013    Past Surgical History:  Procedure Laterality Date  . BACK SURGERY      OB History    No data available       Home Medications    Prior to Admission medications   Medication Sig Start Date End Date Taking? Authorizing Provider  acetaminophen (TYLENOL) 500 MG tablet Take 500 mg  by mouth every 6 (six) hours as needed for mild pain or fever.    Historical Provider, MD  aspirin 81 MG tablet Take 81 mg by mouth every morning.     Historical Provider, MD  cholecalciferol (VITAMIN D) 1000 UNITS tablet Take 2,000 Units by mouth every morning.    Historical Provider, MD  loratadine (CLARITIN) 10 MG tablet Take 10 mg by mouth every morning.    Historical Provider, MD  omeprazole (PRILOSEC) 20 MG capsule Take 20 mg by mouth every morning.     Historical Provider, MD  oseltamivir (TAMIFLU) 30 MG capsule Take 1 capsule (30 mg total) by mouth 2 (two) times daily. For 3 days 12/16/13   Domenic Polite, MD  rosuvastatin (CRESTOR) 5 MG tablet Take 2.5 mg by mouth every evening.     Historical Provider, MD  valsartan-hydrochlorothiazide (DIOVAN-HCT) 160-25 MG per tablet Take 1 tablet by mouth daily.    Historical Provider, MD    Family History Family History  Problem Relation Age of Onset  . Colon cancer Mother   . Lung cancer Father   . Crohn's disease Brother     Social History Social History  Substance Use Topics  . Smoking status: Never Smoker  . Smokeless tobacco: Never Used  . Alcohol use No     Allergies   Patient has no known allergies.   Review of Systems Review of Systems 10 systems reviewed and all are negative for acute change except as  noted in the HPI.  Physical Exam Updated Vital Signs BP 144/71 (BP Location: Right Arm)   Pulse 86   Temp 97.6 F (36.4 C) (Oral)   Resp 17   Ht 5\' 9"  (1.753 m)   Wt 155 lb (70.3 kg)   SpO2 98%   BMI 22.89 kg/m   Physical Exam  Constitutional: She is oriented to person, place, and time.  Cardiovascular: Normal rate, regular rhythm and normal heart sounds.  Exam reveals no gallop and no friction rub.   No murmur heard. Pulmonary/Chest: Effort normal and breath sounds normal. No respiratory distress.  Abdominal: Soft. Bowel sounds are normal. There is no tenderness.  Musculoskeletal:  No peripheral edema or  calf tenderness.   Neurological: She is alert and oriented to person, place, and time. No cranial nerve deficit. She exhibits normal muscle tone. Coordination normal.  Psychiatric: She has a normal mood and affect. Her behavior is normal. Judgment and thought content normal.  Nursing note and vitals reviewed.    ED Treatments / Results  DIAGNOSTIC STUDIES: Oxygen Saturation is 100 percent on room air which is normal by my interpretation.    COORDINATION OF CARE: 1:39 AM Discussed treatment plan with pt at bedside and pt agreed to plan.  Labs (all labs ordered are listed, but only abnormal results are displayed) Labs Reviewed  CBC WITH DIFFERENTIAL/PLATELET - Abnormal; Notable for the following:       Result Value   Eosinophils Absolute 0.8 (*)    All other components within normal limits  COMPREHENSIVE METABOLIC PANEL - Abnormal; Notable for the following:    Glucose, Bld 135 (*)    BUN 26 (*)    Creatinine, Ser 1.14 (*)    GFR calc non Af Amer 46 (*)    GFR calc Af Amer 53 (*)    All other components within normal limits  I-STAT TROPOININ, ED    EKG  EKG Interpretation  Date/Time:  Saturday February 24 2017 00:34:38 EST Ventricular Rate:  87 PR Interval:    QRS Duration: 99 QT Interval:  379 QTC Calculation: 456 R Axis:   60 Text Interpretation:  Sinus rhythm normal no change Confirmed by Johnney Killian, MD, Jeannie Done [phone]) on 02/24/2017 1:37:06 AM       Radiology No results found.  Procedures Procedures (including critical care time)  Medications Ordered in ED Medications - No data to display   Initial Impression / Assessment and Plan / ED Course  I have reviewed the triage vital signs and the nursing notes.  Pertinent labs & imaging results that were available during my care of the patient were reviewed by me and considered in my medical decision making (see chart for details).      Final Clinical Impressions(s) / ED Diagnoses   Final diagnoses:  Hypertension,  unspecified type  Fatigue, unspecified type  Blurred vision, bilateral   She presents with acute onset of blood pressure spike with symptoms of palpitations, flushing and blurred vision. Her symptoms have resolved and there are no signs of end organ damage. Blood pressure in the emergency department is not significantly elevated to suggest hypertensive urgency or emergency. Patient took an additional diovan at the time of her symptoms. Patient will keep a log of her blood pressures to review with her PCP. Signs and symptoms for she return are reviewed. New Prescriptions Discharge Medication List as of 02/24/2017  5:00 AM       Charlesetta Shanks, MD 03/03/17 1014

## 2017-02-24 NOTE — Discharge Instructions (Signed)
Keep a log of your blood pressure until you see your doctor for recheck.

## 2017-02-24 NOTE — ED Notes (Signed)
[facility] contacted and the 23:55 troponin was 0.00

## 2017-02-26 DIAGNOSIS — I10 Essential (primary) hypertension: Secondary | ICD-10-CM | POA: Diagnosis not present

## 2017-03-07 DIAGNOSIS — I10 Essential (primary) hypertension: Secondary | ICD-10-CM | POA: Diagnosis not present

## 2017-03-12 DIAGNOSIS — I10 Essential (primary) hypertension: Secondary | ICD-10-CM | POA: Diagnosis not present

## 2017-03-27 DIAGNOSIS — I10 Essential (primary) hypertension: Secondary | ICD-10-CM | POA: Diagnosis not present

## 2017-04-02 DIAGNOSIS — I10 Essential (primary) hypertension: Secondary | ICD-10-CM | POA: Diagnosis not present

## 2017-04-06 ENCOUNTER — Inpatient Hospital Stay (HOSPITAL_COMMUNITY)
Admission: EM | Admit: 2017-04-06 | Discharge: 2017-04-10 | DRG: 069 | Disposition: A | Discharge status: Home / Self Care | Payer: Medicare Other | Attending: Nephrology | Admitting: Nephrology

## 2017-04-06 ENCOUNTER — Observation Stay (HOSPITAL_COMMUNITY): Payer: Medicare Other

## 2017-04-06 ENCOUNTER — Encounter (HOSPITAL_COMMUNITY): Payer: Self-pay

## 2017-04-06 ENCOUNTER — Emergency Department (HOSPITAL_COMMUNITY): Payer: Medicare Other

## 2017-04-06 DIAGNOSIS — R2990 Unspecified symptoms and signs involving the nervous system: Secondary | ICD-10-CM | POA: Diagnosis present

## 2017-04-06 DIAGNOSIS — E785 Hyperlipidemia, unspecified: Secondary | ICD-10-CM | POA: Diagnosis not present

## 2017-04-06 DIAGNOSIS — R079 Chest pain, unspecified: Secondary | ICD-10-CM

## 2017-04-06 DIAGNOSIS — Z800 Family history of malignant neoplasm of digestive organs: Secondary | ICD-10-CM

## 2017-04-06 DIAGNOSIS — I10 Essential (primary) hypertension: Secondary | ICD-10-CM | POA: Diagnosis not present

## 2017-04-06 DIAGNOSIS — R778 Other specified abnormalities of plasma proteins: Secondary | ICD-10-CM

## 2017-04-06 DIAGNOSIS — G459 Transient cerebral ischemic attack, unspecified: Secondary | ICD-10-CM | POA: Diagnosis not present

## 2017-04-06 DIAGNOSIS — R109 Unspecified abdominal pain: Secondary | ICD-10-CM

## 2017-04-06 DIAGNOSIS — R0789 Other chest pain: Secondary | ICD-10-CM | POA: Diagnosis not present

## 2017-04-06 DIAGNOSIS — Z801 Family history of malignant neoplasm of trachea, bronchus and lung: Secondary | ICD-10-CM

## 2017-04-06 DIAGNOSIS — K219 Gastro-esophageal reflux disease without esophagitis: Secondary | ICD-10-CM | POA: Diagnosis present

## 2017-04-06 DIAGNOSIS — Z7982 Long term (current) use of aspirin: Secondary | ICD-10-CM

## 2017-04-06 DIAGNOSIS — E7800 Pure hypercholesterolemia, unspecified: Secondary | ICD-10-CM | POA: Diagnosis not present

## 2017-04-06 DIAGNOSIS — Z8379 Family history of other diseases of the digestive system: Secondary | ICD-10-CM

## 2017-04-06 DIAGNOSIS — E876 Hypokalemia: Secondary | ICD-10-CM | POA: Diagnosis present

## 2017-04-06 DIAGNOSIS — R51 Headache: Secondary | ICD-10-CM | POA: Diagnosis not present

## 2017-04-06 DIAGNOSIS — Z79899 Other long term (current) drug therapy: Secondary | ICD-10-CM

## 2017-04-06 DIAGNOSIS — M542 Cervicalgia: Secondary | ICD-10-CM | POA: Diagnosis not present

## 2017-04-06 DIAGNOSIS — R1012 Left upper quadrant pain: Secondary | ICD-10-CM | POA: Diagnosis present

## 2017-04-06 DIAGNOSIS — I77819 Aortic ectasia, unspecified site: Secondary | ICD-10-CM | POA: Diagnosis present

## 2017-04-06 DIAGNOSIS — R202 Paresthesia of skin: Secondary | ICD-10-CM | POA: Diagnosis not present

## 2017-04-06 DIAGNOSIS — R030 Elevated blood-pressure reading, without diagnosis of hypertension: Secondary | ICD-10-CM | POA: Diagnosis not present

## 2017-04-06 DIAGNOSIS — R000 Tachycardia, unspecified: Secondary | ICD-10-CM | POA: Diagnosis present

## 2017-04-06 DIAGNOSIS — R7989 Other specified abnormal findings of blood chemistry: Secondary | ICD-10-CM | POA: Diagnosis present

## 2017-04-06 LAB — CBC
HEMATOCRIT: 38.9 % (ref 36.0–46.0)
HEMATOCRIT: 37.6 % (ref 36.0–46.0)
HEMOGLOBIN: 13 g/dL (ref 12.0–15.0)
Hemoglobin: 12.6 g/dL (ref 12.0–15.0)
MCH: 29.2 pg (ref 26.0–34.0)
MCH: 29.2 pg (ref 26.0–34.0)
MCHC: 33.4 g/dL (ref 30.0–36.0)
MCHC: 33.5 g/dL (ref 30.0–36.0)
MCV: 87.4 fL (ref 78.0–100.0)
MCV: 87 fL (ref 78.0–100.0)
PLATELETS: 261 10*3/uL (ref 150–400)
Platelets: 264 10*3/uL (ref 150–400)
RBC: 4.45 MIL/uL (ref 3.87–5.11)
RBC: 4.32 MIL/uL (ref 3.87–5.11)
RDW: 13.8 % (ref 11.5–15.5)
RDW: 13.7 % (ref 11.5–15.5)
WBC: 7.6 10*3/uL (ref 4.0–10.5)
WBC: 7 10*3/uL (ref 4.0–10.5)

## 2017-04-06 LAB — COMPREHENSIVE METABOLIC PANEL
ALBUMIN: 3.8 g/dL (ref 3.5–5.0)
ALT: 12 U/L — AB (ref 14–54)
AST: 16 U/L (ref 15–41)
Alkaline Phosphatase: 47 U/L (ref 38–126)
Anion gap: 10 (ref 5–15)
BUN: 17 mg/dL (ref 6–20)
CO2: 24 mmol/L (ref 22–32)
CREATININE: 0.89 mg/dL (ref 0.44–1.00)
Calcium: 9.6 mg/dL (ref 8.9–10.3)
Chloride: 105 mmol/L (ref 101–111)
GFR calc Af Amer: >60 mL/min (ref >60)
GFR calc non Af Amer: >60 mL/min (ref >60)
GLUCOSE: 100 mg/dL — AB (ref 65–99)
Potassium: 4 mmol/L (ref 3.5–5.1)
SODIUM: 139 mmol/L (ref 135–145)
Total Bilirubin: 0.4 mg/dL (ref 0.3–1.2)
Total Protein: 6.5 g/dL (ref 6.5–8.1)

## 2017-04-06 LAB — I-STAT CHEM 8, ED
BUN: 20 mg/dL (ref 6–20)
CALCIUM ION: 1.14 mmol/L — AB (ref 1.15–1.40)
CHLORIDE: 105 mmol/L (ref 101–111)
Creatinine, Ser: 0.9 mg/dL (ref 0.44–1.00)
GLUCOSE: 100 mg/dL — AB (ref 65–99)
HCT: 39 % (ref 36.0–46.0)
HEMOGLOBIN: 13.3 g/dL (ref 12.0–15.0)
Potassium: 4 mmol/L (ref 3.5–5.1)
Sodium: 139 mmol/L (ref 135–145)
TCO2: 25 mmol/L (ref 0–100)

## 2017-04-06 LAB — DIFFERENTIAL
BASOS ABS: 0.1 10*3/uL (ref 0.0–0.1)
Basophils Relative: 2 %
Eosinophils Absolute: 0.4 10*3/uL (ref 0.0–0.7)
Eosinophils Relative: 5 %
LYMPHS PCT: 19 %
Lymphs Abs: 1.5 10*3/uL (ref 0.7–4.0)
MONO ABS: 0.6 10*3/uL (ref 0.1–1.0)
Monocytes Relative: 8 %
NEUTROS ABS: 5 10*3/uL (ref 1.7–7.7)
Neutrophils Relative %: 66 %

## 2017-04-06 LAB — URINALYSIS, ROUTINE W REFLEX MICROSCOPIC
Bacteria, UA: NONE SEEN
Bilirubin Urine: NEGATIVE
Glucose, UA: NEGATIVE mg/dL
Ketones, ur: NEGATIVE mg/dL
Leukocytes, UA: NEGATIVE
NITRITE: NEGATIVE
PH: 5 (ref 5.0–8.0)
Protein, ur: NEGATIVE mg/dL
SPECIFIC GRAVITY, URINE: 1.012 (ref 1.005–1.030)

## 2017-04-06 LAB — CREATININE, SERUM
Creatinine, Ser: 0.85 mg/dL (ref 0.44–1.00)
GFR calc non Af Amer: >60 mL/min (ref >60)

## 2017-04-06 LAB — RAPID URINE DRUG SCREEN, HOSP PERFORMED
AMPHETAMINES: NOT DETECTED
BARBITURATES: NOT DETECTED
BENZODIAZEPINES: NOT DETECTED
Cocaine: NOT DETECTED
Opiates: NOT DETECTED
TETRAHYDROCANNABINOL: NOT DETECTED

## 2017-04-06 LAB — I-STAT TROPONIN, ED: Troponin i, poc: 0 ng/mL (ref 0.00–0.08)

## 2017-04-06 LAB — ETHANOL

## 2017-04-06 LAB — PROTIME-INR
INR: 1.04
Prothrombin Time: 13.6 seconds (ref 11.4–15.2)

## 2017-04-06 LAB — APTT: aPTT: 30 seconds (ref 24–36)

## 2017-04-06 LAB — MAGNESIUM: Magnesium: 1.4 mg/dL — ABNORMAL LOW (ref 1.7–2.4)

## 2017-04-06 LAB — PHOSPHORUS: Phosphorus: 3 mg/dL (ref 2.5–4.6)

## 2017-04-06 MED ORDER — HYDRALAZINE HCL 20 MG/ML IJ SOLN
10.0000 mg | Freq: Three times a day (TID) | INTRAMUSCULAR | Status: DC | PRN
  Administered 2017-04-08: 10 mg via INTRAVENOUS
  Filled 2017-04-06: qty 1

## 2017-04-06 MED ORDER — SODIUM CHLORIDE 0.9 % IV SOLN
INTRAVENOUS | Status: AC
  Administered 2017-04-06 – 2017-04-07 (×2): via INTRAVENOUS

## 2017-04-06 MED ORDER — STROKE: EARLY STAGES OF RECOVERY BOOK
Freq: Once | Status: AC
  Administered 2017-04-08: 1
  Filled 2017-04-06 (×2): qty 1

## 2017-04-06 MED ORDER — ACETAMINOPHEN 650 MG RE SUPP: 650.0000 mg | RECTAL | Status: DC | PRN

## 2017-04-06 MED ORDER — ASPIRIN 81 MG PO TABS: 324.0000 mg | ORAL_TABLET | Freq: Every day | ORAL | Status: DC

## 2017-04-06 MED ORDER — PANTOPRAZOLE SODIUM 40 MG PO TBEC
40.0000 mg | DELAYED_RELEASE_TABLET | Freq: Every day | ORAL | Status: DC
  Administered 2017-04-07: 40 mg via ORAL
  Filled 2017-04-06: qty 1

## 2017-04-06 MED ORDER — SENNOSIDES-DOCUSATE SODIUM 8.6-50 MG PO TABS
1.0000 | ORAL_TABLET | Freq: Every evening | ORAL | Status: DC | PRN
  Filled 2017-04-06: qty 1

## 2017-04-06 MED ORDER — ASPIRIN 81 MG PO CHEW
324.0000 mg | CHEWABLE_TABLET | Freq: Every day | ORAL | Status: DC
  Administered 2017-04-07 – 2017-04-10 (×4): 324 mg via ORAL
  Filled 2017-04-06 (×5): qty 4

## 2017-04-06 MED ORDER — ACETAMINOPHEN 160 MG/5ML PO SOLN: 650.0000 mg | ORAL | Status: DC | PRN

## 2017-04-06 MED ORDER — ACETAMINOPHEN 325 MG PO TABS
650.0000 mg | ORAL_TABLET | ORAL | Status: DC | PRN
  Administered 2017-04-07 – 2017-04-08 (×3): 650 mg via ORAL
  Filled 2017-04-06 (×3): qty 2

## 2017-04-06 MED ORDER — ASPIRIN 81 MG PO TABS: 364.0000 mg | ORAL_TABLET | Freq: Every day | ORAL | Status: DC

## 2017-04-06 MED ORDER — HEPARIN SODIUM (PORCINE) 5000 UNIT/ML IJ SOLN
5000.0000 [IU] | Freq: Three times a day (TID) | INTRAMUSCULAR | Status: DC
  Administered 2017-04-06 – 2017-04-08 (×6): 5000 [IU] via SUBCUTANEOUS
  Filled 2017-04-06 (×6): qty 1

## 2017-04-06 MED ORDER — ROSUVASTATIN CALCIUM 5 MG PO TABS
2.5000 mg | ORAL_TABLET | Freq: Every evening | ORAL | Status: DC
  Administered 2017-04-06 – 2017-04-08 (×3): 2.5 mg via ORAL
  Filled 2017-04-06 (×3): qty 1

## 2017-04-06 MED ORDER — LORATADINE 10 MG PO TABS
10.0000 mg | ORAL_TABLET | Freq: Every day | ORAL | Status: DC
  Administered 2017-04-07 – 2017-04-10 (×4): 10 mg via ORAL
  Filled 2017-04-06 (×4): qty 1

## 2017-04-06 NOTE — ED Provider Notes (Signed)
[place] [facility] DEPT Provider Note   CSN: [id number] Arrival date & time: 04/06/17  1317     History   Chief Complaint Chief Complaint  Patient presents with  . Hypertension  . Numbness    HPI Tina Ellis is a 76 y.o. female.  76 yo F with a cc of numbness and tingling to R arm.  Recently changed bp meds about a week ago.  Have been running a bit higher.  Having some perioral peristhesias as well.  Headaches coming and going. Felt that her hand was clumsy as well.  No difficulty walking, talking.  Resolved after about an hour.    The history is provided by the patient.  Hypertension  Pertinent negatives include no chest pain, no abdominal pain, no headaches and no shortness of breath.  Illness  This is a new problem. The current episode started 3 to 5 hours ago. The problem occurs rarely. The problem has been resolved. Pertinent negatives include no chest pain, no abdominal pain, no headaches and no shortness of breath. Nothing aggravates the symptoms. Nothing relieves the symptoms. She has tried nothing for the symptoms. The treatment provided no relief.    Past Medical History:  Diagnosis Date  . GERD (gastroesophageal reflux disease)   . Hypercholesteremia   . Hypertension   . Seasonal allergies     Patient Active Problem List   Diagnosis Date Noted  . Stroke-like symptom 04/06/2017  . HTN (hypertension) 04/06/2017  . GERD (gastroesophageal reflux disease) 04/06/2017  . Influenza A 12/15/2013  . Hypotension 12/14/2013  . Acute renal failure ([place]) 12/14/2013  . UTI (lower urinary tract infection) 12/13/2013  . Hypotension, unspecified 12/13/2013  . Dehydration 12/13/2013    Past Surgical History:  Procedure Laterality Date  . BACK SURGERY      OB History    No data available       Home Medications    Prior to Admission medications   Medication Sig Start Date End Date Taking? Authorizing Provider  acetaminophen (TYLENOL) 500 MG tablet Take 500 mg  by mouth every 6 (six) hours as needed (for pain).    Yes Historical Provider, MD  aspirin 81 MG tablet Take 81 mg by mouth every morning.    Yes Historical Provider, MD  cetirizine (ZYRTEC) 10 MG tablet Take 10 mg by mouth at bedtime.   Yes Historical Provider, MD  cholecalciferol (VITAMIN D) 1000 UNITS tablet Take 2,000 Units by mouth every morning.   Yes Historical Provider, MD  hydrochlorothiazide (HYDRODIURIL) 25 MG tablet Take 25 mg by mouth every morning. 03/12/17  Yes Historical Provider, MD  losartan (COZAAR) 100 MG tablet Take 100 mg by mouth every morning.   Yes Historical Provider, MD  omeprazole (PRILOSEC) 20 MG capsule Take 20 mg by mouth every morning.    Yes Historical Provider, MD  rosuvastatin (CRESTOR) 5 MG tablet Take 2.5 mg by mouth every evening.    Yes Historical Provider, MD    Family History Family History  Problem Relation Age of Onset  . Colon cancer Mother   . Lung cancer Father   . Crohn's disease Brother     Social History Social History  Substance Use Topics  . Smoking status: Never Smoker  . Smokeless tobacco: Never Used  . Alcohol use No     Allergies   Patient has no known allergies.   Review of Systems Review of Systems  Constitutional: Negative for chills and fever.  HENT: Negative for congestion and rhinorrhea.  Eyes: Negative for redness and visual disturbance.  Respiratory: Negative for shortness of breath and wheezing.   Cardiovascular: Negative for chest pain and palpitations.  Gastrointestinal: Negative for abdominal pain, nausea and vomiting.  Genitourinary: Negative for dysuria and urgency.  Musculoskeletal: Negative for arthralgias and myalgias.  Skin: Negative for pallor and wound.  Neurological: Positive for weakness and numbness (tingling). Negative for dizziness and headaches.     Physical Exam Updated Vital Signs BP (!) 164/89   Pulse 76   Temp 98.4 F (36.9 C)   Resp 15   SpO2 98%   Physical Exam    Constitutional: She is oriented to person, place, and time. She appears well-developed and well-nourished. No distress.  HENT:  Head: Normocephalic and atraumatic.  Eyes: EOM are normal. Pupils are equal, round, and reactive to light.  Neck: Normal range of motion. Neck supple.  Cardiovascular: Normal rate and regular rhythm.  Exam reveals no gallop and no friction rub.   No murmur heard. Pulmonary/Chest: Effort normal. She has no wheezes. She has no rales.  Abdominal: Soft. She exhibits no distension. There is no tenderness.  Musculoskeletal: She exhibits no edema or tenderness.  Neurological: She is alert and oriented to person, place, and time. She has normal strength. No cranial nerve deficit or sensory deficit. She displays a negative Romberg sign. Coordination and gait normal. GCS eye subscore is 4. GCS verbal subscore is 5. GCS motor subscore is 6. She displays no Babinski's sign on the right side. She displays no Babinski's sign on the left side.  Reflex Scores:      Tricep reflexes are 2+ on the right side and 2+ on the left side.      Bicep reflexes are 2+ on the right side and 2+ on the left side.      Brachioradialis reflexes are 2+ on the right side and 2+ on the left side.      Patellar reflexes are 2+ on the right side and 2+ on the left side.      Achilles reflexes are 2+ on the right side and 2+ on the left side. Benign neuro exam  Skin: Skin is warm and dry. She is not diaphoretic.  Psychiatric: She has a normal mood and affect. Her behavior is normal.  Nursing note and vitals reviewed.    ED Treatments / Results  Labs (all labs ordered are listed, but only abnormal results are displayed) Labs Reviewed  COMPREHENSIVE METABOLIC PANEL - Abnormal; Notable for the following:       Result Value   Glucose, Bld 100 (*)    ALT 12 (*)    All other components within normal limits  URINALYSIS, ROUTINE W REFLEX MICROSCOPIC - Abnormal; Notable for the following:    Hgb urine  dipstick MODERATE (*)    Squamous Epithelial / LPF 0-5 (*)    All other components within normal limits  MAGNESIUM - Abnormal; Notable for the following:    Magnesium 1.4 (*)    All other components within normal limits  I-STAT CHEM 8, ED - Abnormal; Notable for the following:    Glucose, Bld 100 (*)    Calcium, Ion 1.14 (*)    All other components within normal limits  ETHANOL  PROTIME-INR  APTT  CBC  DIFFERENTIAL  RAPID URINE DRUG SCREEN, HOSP PERFORMED  PHOSPHORUS  CBC  CREATININE, SERUM  VITAMIN D 25 HYDROXY (VIT D DEFICIENCY, FRACTURES)  HEMOGLOBIN A1C  LIPID PANEL  I-STAT TROPOININ, ED  EKG  EKG Interpretation  Date/Time:  Friday April 06 2017 13:23:52 EDT Ventricular Rate:  82 PR Interval:    QRS Duration: 100 QT Interval:  387 QTC Calculation: 452 R Axis:   61 Text Interpretation:  Sinus rhythm Left atrial enlargement No significant change since last tracing Confirmed by Hallelujah Wysong MD, Quillian Quince [phone]) on 04/06/2017 1:46:30 PM       Radiology Ct Head Wo Contrast  Result Date: 04/06/2017 CLINICAL DATA:  Stroke symptoms greater than 8 hours, LEFT side pain and weakness, LEFT hand weakness, high blood pressure and headaches for 1 week, history hypertension, hypercholesterolemia EXAM: CT HEAD WITHOUT CONTRAST CT CERVICAL SPINE WITHOUT CONTRAST TECHNIQUE: Multidetector CT imaging of the head and cervical spine was performed following the standard protocol without intravenous contrast. Multiplanar CT image reconstructions of the cervical spine were also generated. COMPARISON:  None FINDINGS: CT HEAD FINDINGS Brain: Generalized atrophy. Normal ventricular morphology. No midline shift or mass effect. Small vessel chronic ischemic changes of deep cerebral white matter. No intracranial hemorrhage, mass lesion, evidence of acute infarction, or extra-axial fluid collection. Vascular: Atherosclerotic calcification of internal carotid arteries at skullbase. Skull: Intact  Sinuses/Orbits: Clear Other: N/A CT CERVICAL SPINE FINDINGS Alignment: Normal Skull base and vertebrae: Visualized skullbase intact. Osseous mineralization mildly decreased diffusely. Scattered facet degenerative changes. Vertebral body heights maintained without fracture or bone destruction. Soft tissues and spinal canal: Prevertebral soft tissues normal thickness. Soft tissues otherwise unremarkable. Atherosclerotic calcifications noted the carotid bifurcations. Disc levels: Disc space narrowing with endplate spur formation at C5-C6 and C6-C7, less at C4-C5. 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 Impingement upon BILATERAL cervical neural foramina at C5-C6 and C6-C7 greater on RIGHT. Upper chest: Lung apices clear. Other: N/A IMPRESSION: Atrophy with small vessel chronic ischemic changes of deep cerebral white matter. No acute intracranial abnormalities. 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 No acute cervical spine abnormalities. Carotid vascular calcifications. Electronically Signed   By: Lavonia Dana M.D.   On: 04/06/2017 15:53   Ct Cervical Spine Wo Contrast  Result Date: 04/06/2017 CLINICAL DATA:  Stroke symptoms greater than 8 hours, LEFT side pain and weakness, LEFT hand weakness, high blood pressure and headaches for 1 week, history hypertension, hypercholesterolemia EXAM: CT HEAD WITHOUT CONTRAST CT CERVICAL SPINE WITHOUT CONTRAST TECHNIQUE: Multidetector CT imaging of the head and cervical spine was performed following the standard protocol without intravenous contrast. Multiplanar CT image reconstructions of the cervical spine were also generated. COMPARISON:  None FINDINGS: CT HEAD FINDINGS Brain: Generalized atrophy. Normal ventricular morphology. No midline shift or mass effect. Small vessel chronic ischemic changes of deep cerebral white matter. No intracranial hemorrhage, mass lesion, evidence of acute infarction, or extra-axial fluid  collection. Vascular: Atherosclerotic calcification of internal carotid arteries at skullbase. Skull: Intact Sinuses/Orbits: Clear Other: N/A CT CERVICAL SPINE FINDINGS Alignment: Normal Skull base and vertebrae: Visualized skullbase intact. Osseous mineralization mildly decreased diffusely. Scattered facet degenerative changes. Vertebral body heights maintained without fracture or bone destruction. Soft tissues and spinal canal: Prevertebral soft tissues normal thickness. Soft tissues otherwise unremarkable. Atherosclerotic calcifications noted the carotid bifurcations. Disc levels: Disc space narrowing with endplate spur formation at C5-C6 and C6-C7, less at C4-C5. 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 Impingement upon BILATERAL cervical neural foramina at C5-C6 and C6-C7 greater on RIGHT. Upper chest: Lung apices clear. Other: N/A IMPRESSION: Atrophy with small vessel chronic ischemic changes of deep cerebral white matter. No acute intracranial abnormalities. Degenerative disc and facet disease  changes cervical spine as above. No acute cervical spine abnormalities. Carotid vascular calcifications. Electronically Signed   By: Lavonia Dana M.D.   On: 04/06/2017 15:53    Procedures Procedures (including critical care time)  Medications Ordered in ED Medications  loratadine (CLARITIN) tablet 10 mg (not administered)  aspirin tablet 364 mg (not administered)  pantoprazole (PROTONIX) EC tablet 40 mg (not administered)  rosuvastatin (CRESTOR) tablet 2.5 mg (not administered)  hydrALAZINE (APRESOLINE) injection 10 mg (not administered)   stroke: mapping our early stages of recovery book (not administered)  0.9 %  sodium chloride infusion (not administered)  acetaminophen (TYLENOL) tablet 650 mg (not administered)    Or  acetaminophen (TYLENOL) solution 650 mg (not administered)    Or  acetaminophen (TYLENOL) suppository 650 mg (not administered)  senna-docusate (Senokot-S)  tablet 1 tablet (not administered)  heparin injection 5,000 Units (not administered)     Initial Impression / Assessment and Plan / ED Course  I have reviewed the triage vital signs and the nursing notes.  Pertinent labs & imaging results that were available during my care of the patient were reviewed by me and considered in my medical decision making (see chart for details).     76 yo F with a cc of left hand paresthesias and weakness.  Transient symptoms.  Resolved.  CT head negative, discussed with neuro, Dr Orlena Sheldon who felt patient did warrant a TIA workup. We'll discuss with hospitalist.   The patients results and plan were reviewed and discussed.   Any x-rays performed were independently reviewed by myself.   Differential diagnosis were considered with the presenting HPI.  Medications  loratadine (CLARITIN) tablet 10 mg (not administered)  aspirin tablet 364 mg (not administered)  pantoprazole (PROTONIX) EC tablet 40 mg (not administered)  rosuvastatin (CRESTOR) tablet 2.5 mg (not administered)  hydrALAZINE (APRESOLINE) injection 10 mg (not administered)   stroke: mapping our early stages of recovery book (not administered)  0.9 %  sodium chloride infusion (not administered)  acetaminophen (TYLENOL) tablet 650 mg (not administered)    Or  acetaminophen (TYLENOL) solution 650 mg (not administered)    Or  acetaminophen (TYLENOL) suppository 650 mg (not administered)  senna-docusate (Senokot-S) tablet 1 tablet (not administered)  heparin injection 5,000 Units (not administered)    Vitals:   04/06/17 1745 04/06/17 1845 04/06/17 1850 04/06/17 1900  BP: (!) 145/78 (!) 167/80 (!) 167/80 (!) 164/89  Pulse: 75 77 73 76  Resp: 16 15 14 15   Temp:      TempSrc:      SpO2: 99% 100% 99% 98%    Final diagnoses:  Stroke-like symptom  Stroke-like symptom    Admission/ observation were discussed with the admitting physician, patient and/or family and they are comfortable with  the plan.    New Prescriptions New Prescriptions   No medications on file     Deno Etienne, DO 04/06/17 1908

## 2017-04-06 NOTE — H&P (Signed)
[facility] History and Physical  LAYONNA DOBIE KKX:[id number] DOB: 1941-12-01 DOA: 04/06/2017  Referring physician:  PCP: Horatio Pel, MD   Chief Complaint: "I got nervous when my hands started tingling."  HPI: Tina Ellis is a 76 y.o. female  with past medical history of hypertension, elevated cholesterol, reflux and allergies presents with numbness and tingling of the left arm distally. Patient states she's had some issue with her blood pressure over the last few weeks. She was even seen in the emergency room for it. Patient became alarmed when she noted that she had some left arm numbness and tingling. This lasted for several hours. And then went away completely. Patient went to see her primary care doctor who noted a elevated blood pressure in the office and sent the patient to the emergency room for evaluation for possible stroke.  ED course: EDP called neurology who recommended admission for stroke workup. Hospitalist was consulted for admission. CT head negative for acute stroke.   Review of Systems:  As per HPI otherwise 10 point review of systems negative.    Past Medical History:  Diagnosis Date  . GERD (gastroesophageal reflux disease)   . Hypercholesteremia   . Hypertension   . Seasonal allergies    Past Surgical History:  Procedure Laterality Date  . BACK SURGERY     Social History:  reports that she has never smoked. She has never used smokeless tobacco. She reports that she does not drink alcohol or use drugs.  No Known Allergies  Family History  Problem Relation Age of Onset  . Colon cancer Mother   . Lung cancer Father   . Crohn's disease Brother      Prior to Admission medications   Medication Sig Start Date End Date Taking? Authorizing Provider  acetaminophen (TYLENOL) 500 MG tablet Take 500 mg by mouth every 6 (six) hours as needed (for pain).    Yes Historical Provider, MD  aspirin 81 MG tablet Take 81 mg by mouth every morning.     Yes Historical Provider, MD  cetirizine (ZYRTEC) 10 MG tablet Take 10 mg by mouth at bedtime.   Yes Historical Provider, MD  cholecalciferol (VITAMIN D) 1000 UNITS tablet Take 2,000 Units by mouth every morning.   Yes Historical Provider, MD  hydrochlorothiazide (HYDRODIURIL) 25 MG tablet Take 25 mg by mouth every morning. 03/12/17  Yes Historical Provider, MD  losartan (COZAAR) 100 MG tablet Take 100 mg by mouth every morning.   Yes Historical Provider, MD  omeprazole (PRILOSEC) 20 MG capsule Take 20 mg by mouth every morning.    Yes Historical Provider, MD  rosuvastatin (CRESTOR) 5 MG tablet Take 2.5 mg by mouth every evening.    Yes Historical Provider, MD  oseltamivir (TAMIFLU) 30 MG capsule Take 1 capsule (30 mg total) by mouth 2 (two) times daily. For 3 days Patient not taking: Reported on 04/06/2017 12/16/13   Domenic Polite, MD   Physical Exam: Vitals:   04/06/17 1430 04/06/17 1445 04/06/17 1503 04/06/17 1515  BP: (!) 145/80 (!) 157/86 (!) 154/81 (!) 148/76  Pulse: 81 74 76 76  Resp: 19 12 16 12   Temp:      TempSrc:      SpO2: 100% 99% 100% 100%    Wt Readings from Last 3 Encounters:  02/23/17 70.3 kg (155 lb)  12/14/13 70.7 kg (155 lb 13.8 oz)    General:  Appears calm and comfortable, Alert and oriented 3 Eyes:  PERRL, EOMI, normal lids,  iris ENT:  grossly normal hearing, lips & tongue Neck:  no LAD, masses or thyromegaly Cardiovascular:  RRR, no m/r/g. No LE edema.  Respiratory:  CTA bilaterally, no w/r/r. Normal respiratory effort. Abdomen:  soft, ntnd Skin:  no rash or induration seen on limited exam Musculoskeletal:  grossly normal tone BUE/BLE Psychiatric:  grossly normal mood and affect, speech fluent and appropriate Neurologic:  CN 2-12 grossly intact, moves all extremities in coordinated fashion. normal stereographesthesia           Labs on Admission:  Basic Metabolic Panel:  Recent Labs Lab 04/06/17 1426 04/06/17 1442  NA 139 139  K 4.0 4.0  CL 105  105  CO2 24  --   GLUCOSE 100* 100*  BUN 17 20  CREATININE 0.89 0.90  CALCIUM 9.6  --    Liver Function Tests:  Recent Labs Lab 04/06/17 1426  AST 16  ALT 12*  ALKPHOS 47  BILITOT 0.4  PROT 6.5  ALBUMIN 3.8   No results for input(s): LIPASE, AMYLASE in the last 168 hours. No results for input(s): AMMONIA in the last 168 hours. CBC:  Recent Labs Lab 04/06/17 1426 04/06/17 1442  WBC 7.6  --   NEUTROABS 5.0  --   HGB 13.0 13.3  HCT 38.9 39.0  MCV 87.4  --   PLT 264  --    Cardiac Enzymes: No results for input(s): CKTOTAL, CKMB, CKMBINDEX, TROPONINI in the last 168 hours.  BNP (last 3 results) No results for input(s): BNP in the last 8760 hours.  ProBNP (last 3 results) No results for input(s): PROBNP in the last 8760 hours.   Creatinine clearance cannot be calculated (Unknown ideal weight.)  CBG: No results for input(s): GLUCAP in the last 168 hours.  Radiological Exams on Admission: Ct Head Wo Contrast  Result Date: 04/06/2017 CLINICAL DATA:  Stroke symptoms greater than 8 hours, LEFT side pain and weakness, LEFT hand weakness, high blood pressure and headaches for 1 week, history hypertension, hypercholesterolemia EXAM: CT HEAD WITHOUT CONTRAST CT CERVICAL SPINE WITHOUT CONTRAST TECHNIQUE: Multidetector CT imaging of the head and cervical spine was performed following the standard protocol without intravenous contrast. Multiplanar CT image reconstructions of the cervical spine were also generated. COMPARISON:  None FINDINGS: CT HEAD FINDINGS Brain: Generalized atrophy. Normal ventricular morphology. No midline shift or mass effect. Small vessel chronic ischemic changes of deep cerebral white matter. No intracranial hemorrhage, mass lesion, evidence of acute infarction, or extra-axial fluid collection. Vascular: Atherosclerotic calcification of internal carotid arteries at skullbase. Skull: Intact Sinuses/Orbits: Clear Other: N/A CT CERVICAL SPINE FINDINGS Alignment:  Normal Skull base and vertebrae: Visualized skullbase intact. Osseous mineralization mildly decreased diffusely. Scattered facet degenerative changes. Vertebral body heights maintained without fracture or bone destruction. Soft tissues and spinal canal: Prevertebral soft tissues normal thickness. Soft tissues otherwise unremarkable. Atherosclerotic calcifications noted the carotid bifurcations. Disc levels: Disc space narrowing with endplate spur formation at C5-C6 and C6-C7, less at C4-C5. 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 Impingement upon BILATERAL cervical neural foramina at C5-C6 and C6-C7 greater on RIGHT. Upper chest: Lung apices clear. Other: N/A IMPRESSION: Atrophy with small vessel chronic ischemic changes of deep cerebral white matter. No acute intracranial abnormalities. 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 No acute cervical spine abnormalities. Carotid vascular calcifications. Electronically Signed   By: Lavonia Dana M.D.   On: 04/06/2017 15:53   Ct Cervical Spine Wo Contrast  Result Date: 04/06/2017 CLINICAL DATA:  Stroke symptoms greater than 8 hours, LEFT side pain and weakness, LEFT hand weakness, high blood pressure and headaches for 1 week, history hypertension, hypercholesterolemia EXAM: CT HEAD WITHOUT CONTRAST CT CERVICAL SPINE WITHOUT CONTRAST TECHNIQUE: Multidetector CT imaging of the head and cervical spine was performed following the standard protocol without intravenous contrast. Multiplanar CT image reconstructions of the cervical spine were also generated. COMPARISON:  None FINDINGS: CT HEAD FINDINGS Brain: Generalized atrophy. Normal ventricular morphology. No midline shift or mass effect. Small vessel chronic ischemic changes of deep cerebral white matter. No intracranial hemorrhage, mass lesion, evidence of acute infarction, or extra-axial fluid collection. Vascular: Atherosclerotic calcification of internal carotid  arteries at skullbase. Skull: Intact Sinuses/Orbits: Clear Other: N/A CT CERVICAL SPINE FINDINGS Alignment: Normal Skull base and vertebrae: Visualized skullbase intact. Osseous mineralization mildly decreased diffusely. Scattered facet degenerative changes. Vertebral body heights maintained without fracture or bone destruction. Soft tissues and spinal canal: Prevertebral soft tissues normal thickness. Soft tissues otherwise unremarkable. Atherosclerotic calcifications noted the carotid bifurcations. Disc levels: Disc space narrowing with endplate spur formation at C5-C6 and C6-C7, less at C4-C5. 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 Impingement upon BILATERAL cervical neural foramina at C5-C6 and C6-C7 greater on RIGHT. Upper chest: Lung apices clear. Other: N/A IMPRESSION: Atrophy with small vessel chronic ischemic changes of deep cerebral white matter. No acute intracranial abnormalities. 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 No acute cervical spine abnormalities. Carotid vascular calcifications. Electronically Signed   By: Lavonia Dana M.D.   On: 04/06/2017 15:53    EKG: Independently reviewed. No STEMI  Assessment/Plan Principal Problem:   Stroke-like symptom Active Problems:   HTN (hypertension)   GERD (gastroesophageal reflux disease)   Stroke CT head negative for acute bleed MRI ordered Aspirin full dose given  And daily A1c ordered Lipid panel ordered Admitted to telemetry bed Echo ordered Carotid Dopplers ordered MRA ordered Stroke team to follow patient UDS neg  Hypertension When necessary hydralazine 10 mg IV as needed for severe blood pressure Permissive hypertension Hold Cozaar, hydrochlorothiazide  Hyperlipidemia Continue statin  GERD Cont PPI  Code Status: FULL  DVT Prophylaxis: Heparin Family Communication: pt by phone with family Disposition Plan: Pending Improvement  Status: OBS, TELE  Elwin Mocha, MD Family Medicine [facility] [email or web address] Password [email or web address]

## 2017-04-06 NOTE — ED Notes (Signed)
MD at bedside. 

## 2017-04-06 NOTE — ED Triage Notes (Signed)
[facility]- pt had an onset of numbness and tingling in her left arm this morning. She states the episode lasted approximately 30 minutes and then resolved. Pt went to her PCP and was sent here for uncontrolled HTN. She reports this has been an ongoing issue for several months. Initial BP 218/110, improved to 186/102 upon arrival.

## 2017-04-06 NOTE — ED Notes (Signed)
Pt ambulatory to restroom

## 2017-04-07 ENCOUNTER — Observation Stay (HOSPITAL_COMMUNITY): Payer: Medicare Other

## 2017-04-07 ENCOUNTER — Encounter (HOSPITAL_COMMUNITY): Payer: Self-pay | Admitting: *Deleted

## 2017-04-07 DIAGNOSIS — R2990 Unspecified symptoms and signs involving the nervous system: Secondary | ICD-10-CM

## 2017-04-07 DIAGNOSIS — I10 Essential (primary) hypertension: Secondary | ICD-10-CM | POA: Diagnosis not present

## 2017-04-07 LAB — CBC WITH DIFFERENTIAL/PLATELET
Basophils Absolute: 0.1 10*3/uL (ref 0.0–0.1)
Basophils Relative: 2 %
EOS ABS: 0.7 10*3/uL (ref 0.0–0.7)
EOS PCT: 9 %
HCT: 36.2 % (ref 36.0–46.0)
Hemoglobin: 12.1 g/dL (ref 12.0–15.0)
LYMPHS ABS: 2.2 10*3/uL (ref 0.7–4.0)
LYMPHS PCT: 31 %
MCH: 29.1 pg (ref 26.0–34.0)
MCHC: 33.4 g/dL (ref 30.0–36.0)
MCV: 87 fL (ref 78.0–100.0)
MONOS PCT: 10 %
Monocytes Absolute: 0.7 10*3/uL (ref 0.1–1.0)
Neutro Abs: 3.4 10*3/uL (ref 1.7–7.7)
Neutrophils Relative %: 48 %
PLATELETS: 257 10*3/uL (ref 150–400)
RBC: 4.16 MIL/uL (ref 3.87–5.11)
RDW: 13.8 % (ref 11.5–15.5)
WBC: 7 10*3/uL (ref 4.0–10.5)

## 2017-04-07 LAB — LIPID PANEL
Cholesterol: 172 mg/dL (ref 0–200)
HDL: 41 mg/dL (ref >40)
LDL CALC: 101 mg/dL — AB (ref 0–99)
TRIGLYCERIDES: 148 mg/dL (ref <150)
Total CHOL/HDL Ratio: 4.2 RATIO
VLDL: 30 mg/dL (ref 0–40)

## 2017-04-07 LAB — BASIC METABOLIC PANEL
Anion gap: 9 (ref 5–15)
BUN: 13 mg/dL (ref 6–20)
CHLORIDE: 105 mmol/L (ref 101–111)
CO2: 26 mmol/L (ref 22–32)
CREATININE: 0.89 mg/dL (ref 0.44–1.00)
Calcium: 9.1 mg/dL (ref 8.9–10.3)
GFR calc Af Amer: >60 mL/min (ref >60)
GFR calc non Af Amer: >60 mL/min (ref >60)
GLUCOSE: 100 mg/dL — AB (ref 65–99)
Potassium: 3 mmol/L — ABNORMAL LOW (ref 3.5–5.1)
SODIUM: 140 mmol/L (ref 135–145)

## 2017-04-07 LAB — VITAMIN D 25 HYDROXY (VIT D DEFICIENCY, FRACTURES): Vit D, 25-Hydroxy: 36.4 ng/mL (ref 30.0–100.0)

## 2017-04-07 MED ORDER — MAGNESIUM OXIDE 400 (241.3 MG) MG PO TABS
400.0000 mg | ORAL_TABLET | Freq: Every day | ORAL | Status: AC
  Administered 2017-04-07 – 2017-04-08 (×2): 400 mg via ORAL
  Filled 2017-04-07 (×2): qty 1

## 2017-04-07 MED ORDER — SODIUM CHLORIDE 0.9 % IV SOLN
30.0000 meq | Freq: Two times a day (BID) | INTRAVENOUS | Status: AC
  Administered 2017-04-07 (×2): 30 meq via INTRAVENOUS
  Filled 2017-04-07 (×2): qty 15

## 2017-04-07 NOTE — Final Progress Note (Signed)
Pt 76 yrs old white female from MD's office to [facility] Ed admitted with ? Stroke work up . A x O x 4 no deficit   Oriented to unit tele box 13 , NSR  No acute distress.Marland Kitchen

## 2017-04-07 NOTE — Progress Notes (Signed)
PT Cancellation Note  Patient Details Name: Tina Ellis MRN: [id number] DOB: [birth year]   Cancelled Treatment:    Reason Eval/Treat Not Completed: Patient not medically ready.  Patient remains on bedrest per orders.   *MD:  Please write activity orders when appropriate for patient.  PT will initiate evaluation at that time.  Thank you.   Despina Pole 04/07/2017, 10:26 AM Carita Pian. Sanjuana Kava, [place] Pager [phone]

## 2017-04-07 NOTE — Progress Notes (Signed)
Patient ID: Tina Ellis, female   DOB: 1941-10-19, 76 y.o.   MRN: [id number]                                                                PROGRESS NOTE                                                                                                                                                                                                             Patient Demographics:    Tina Ellis, is a 76 y.o. female, DOB - [birth year], [id number]:[id number]  Admit date - 04/06/2017   Admitting Physician Elwin Mocha, MD  Outpatient Primary MD for the patient is Horatio Pel, MD  LOS - 0  Outpatient Specialists:    Chief Complaint  Patient presents with  . Hypertension  . Numbness       Brief Narrative  76 y.o. female  with past medical history of hypertension, elevated cholesterol, reflux and allergies presents with numbness and tingling of the left arm distally. Patient states she's had some issue with her blood pressure over the last few weeks. She was even seen in the emergency room for it. Patient became alarmed when she noted that she had some left arm numbness and tingling. This lasted for several hours. And then went away completely. Patient went to see her primary care doctor who noted a elevated blood pressure in the office and sent the patient to the emergency room for evaluation for possible stroke.  ED course: EDP called neurology who recommended admission for stroke workup. Hospitalist was consulted for admission. CT head negative for acute stroke.    Subjective:    [name] today has no left hand numbness, tingling.  Her symptom has resolved.  Pt denies headache, cp, palp, sob, lower ext edema.     Assessment  & Plan :    Principal Problem:   Stroke-like symptom Active Problems:   HTN (hypertension)   GERD (gastroesophageal reflux disease)  L arm numbness, tingling ddx tia vs cva  Awaiting carotid u/s, cardiac echo  Cont aspirin, crestor Appreciate  neurology input  Hypokalemia Replete Check cmp in am  Hypomagnesemia Start mag oxide 400mg  po qday Check magnesium in am  Hypertension When necessary hydralazine 10 mg IV as needed for severe blood  pressure Permissive hypertension Hold Cozaar, hydrochlorothiazide  Hyperlipidemia Continue statin  GERD Cont PPI  Code Status: FULL  DVT Prophylaxis: Heparin Family Communication: pt by phone with family Disposition Plan: Pending Improvement  Lab Results  Component Value Date   PLT 257 04/07/2017    Antibiotics  :   Anti-infectives    None        Objective:   Vitals:   04/06/17 2330 04/07/17 0130 04/07/17 0330 04/07/17 0530  BP: (!) 149/56 (!) 135/56 (!) 150/81 (!) 150/63  Pulse: 79 77 82 77  Resp: 18 18 16 18   Temp:  98.6 F (37 C) 97.6 F (36.4 C) 97.3 F (36.3 C)  TempSrc:  Oral Oral Oral  SpO2: (!) 8% 98% 98% 98%    Wt Readings from Last 3 Encounters:  02/23/17 70.3 kg (155 lb)  12/14/13 70.7 kg (155 lb 13.8 oz)    No intake or output data in the 24 hours ending 04/07/17 3086   Physical Exam  Awake Alert, Oriented X 3, No new F.N deficits, Normal affect [place].AT,PERRAL Supple Neck,No JVD, No cervical lymphadenopathy appriciated.  Symmetrical Chest wall movement, Good air movement bilaterally, CTAB RRR,No Gallops,Rubs or new Murmurs, No Parasternal Heave +ve B.Sounds, Abd Soft, No tenderness, No organomegaly appriciated, No rebound - guarding or rigidity. No Cyanosis, Clubbing or edema, No new Rash or bruise      Data Review:    CBC  Recent Labs Lab 04/06/17 1426 04/06/17 1442 04/06/17 1733 04/07/17 0243  WBC 7.6  --  7.0 7.0  HGB 13.0 13.3 12.6 12.1  HCT 38.9 39.0 37.6 36.2  PLT 264  --  261 257  MCV 87.4  --  87.0 87.0  MCH 29.2  --  29.2 29.1  MCHC 33.4  --  33.5 33.4  RDW 13.8  --  13.7 13.8  LYMPHSABS 1.5  --   --  2.2  MONOABS 0.6  --   --  0.7  EOSABS 0.4  --   --  0.7  BASOSABS 0.1  --   --  0.1    Chemistries    Recent Labs Lab 04/06/17 1426 04/06/17 1442 04/06/17 1733 04/07/17 0243  NA 139 139  --  140  K 4.0 4.0  --  3.0*  CL 105 105  --  105  CO2 24  --   --  26  GLUCOSE 100* 100*  --  100*  BUN 17 20  --  13  CREATININE 0.89 0.90 0.85 0.89  CALCIUM 9.6  --   --  9.1  MG  --   --  1.4*  --   AST 16  --   --   --   ALT 12*  --   --   --   ALKPHOS 47  --   --   --   BILITOT 0.4  --   --   --    ------------------------------------------------------------------------------------------------------------------  Recent Labs  04/07/17 0243  CHOL 172  HDL 41  LDLCALC 101*  TRIG 148  CHOLHDL 4.2    No results found for: HGBA1C ------------------------------------------------------------------------------------------------------------------ No results for input(s): TSH, T4TOTAL, T3FREE, THYROIDAB in the last 72 hours.  Invalid input(s): FREET3 ------------------------------------------------------------------------------------------------------------------ No results for input(s): VITAMINB12, FOLATE, FERRITIN, TIBC, IRON, RETICCTPCT in the last 72 hours.  Coagulation profile  Recent Labs Lab 04/06/17 1426  INR 1.04    No results for input(s): DDIMER in the last 72 hours.  Cardiac Enzymes No results for input(s): CKMB,  TROPONINI, MYOGLOBIN in the last 168 hours.  Invalid input(s): CK ------------------------------------------------------------------------------------------------------------------ No results found for: BNP  Inpatient Medications  Scheduled Meds: .  stroke: mapping our early stages of recovery book   Does not apply Once  . aspirin  324 mg Oral Daily  . heparin  5,000 Units Subcutaneous Q8H  . loratadine  10 mg Oral Daily  . pantoprazole  40 mg Oral Daily  . potassium chloride (KCL MULTIRUN) 30 mEq in 265 mL IVPB  30 mEq Intravenous BID  . rosuvastatin  2.5 mg Oral QPM   Continuous Infusions: . sodium chloride 100 mL/hr at 04/06/17 2216   PRN  Meds:.acetaminophen **OR** acetaminophen (TYLENOL) oral liquid 160 mg/5 mL **OR** acetaminophen, hydrALAZINE, senna-docusate  Micro Results No results found for this or any previous visit (from the past 240 hour(s)).  Radiology Reports Ct Head Wo Contrast  Result Date: 04/06/2017 CLINICAL DATA:  Stroke symptoms greater than 8 hours, LEFT side pain and weakness, LEFT hand weakness, high blood pressure and headaches for 1 week, history hypertension, hypercholesterolemia EXAM: CT HEAD WITHOUT CONTRAST CT CERVICAL SPINE WITHOUT CONTRAST TECHNIQUE: Multidetector CT imaging of the head and cervical spine was performed following the standard protocol without intravenous contrast. Multiplanar CT image reconstructions of the cervical spine were also generated. COMPARISON:  None FINDINGS: CT HEAD FINDINGS Brain: Generalized atrophy. Normal ventricular morphology. No midline shift or mass effect. Small vessel chronic ischemic changes of deep cerebral white matter. No intracranial hemorrhage, mass lesion, evidence of acute infarction, or extra-axial fluid collection. Vascular: Atherosclerotic calcification of internal carotid arteries at skullbase. Skull: Intact Sinuses/Orbits: Clear Other: N/A CT CERVICAL SPINE FINDINGS Alignment: Normal Skull base and vertebrae: Visualized skullbase intact. Osseous mineralization mildly decreased diffusely. Scattered facet degenerative changes. Vertebral body heights maintained without fracture or bone destruction. Soft tissues and spinal canal: Prevertebral soft tissues normal thickness. Soft tissues otherwise unremarkable. Atherosclerotic calcifications noted the carotid bifurcations. Disc levels: Disc space narrowing with endplate spur formation at C5-C6 and C6-C7, less at C4-C5. 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 Impingement upon BILATERAL cervical neural foramina at C5-C6 and C6-C7 greater on RIGHT. Upper chest: Lung apices clear. Other: N/A  IMPRESSION: Atrophy with small vessel chronic ischemic changes of deep cerebral white matter. No acute intracranial abnormalities. 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 No acute cervical spine abnormalities. Carotid vascular calcifications. Electronically Signed   By: Lavonia Dana M.D.   On: 04/06/2017 15:53   Ct Cervical Spine Wo Contrast  Result Date: 04/06/2017 CLINICAL DATA:  Stroke symptoms greater than 8 hours, LEFT side pain and weakness, LEFT hand weakness, high blood pressure and headaches for 1 week, history hypertension, hypercholesterolemia EXAM: CT HEAD WITHOUT CONTRAST CT CERVICAL SPINE WITHOUT CONTRAST TECHNIQUE: Multidetector CT imaging of the head and cervical spine was performed following the standard protocol without intravenous contrast. Multiplanar CT image reconstructions of the cervical spine were also generated. COMPARISON:  None FINDINGS: CT HEAD FINDINGS Brain: Generalized atrophy. Normal ventricular morphology. No midline shift or mass effect. Small vessel chronic ischemic changes of deep cerebral white matter. No intracranial hemorrhage, mass lesion, evidence of acute infarction, or extra-axial fluid collection. Vascular: Atherosclerotic calcification of internal carotid arteries at skullbase. Skull: Intact Sinuses/Orbits: Clear Other: N/A CT CERVICAL SPINE FINDINGS Alignment: Normal Skull base and vertebrae: Visualized skullbase intact. Osseous mineralization mildly decreased diffusely. Scattered facet degenerative changes. Vertebral body heights maintained without fracture or bone destruction. Soft tissues and spinal canal: Prevertebral soft tissues normal thickness.  Soft tissues otherwise unremarkable. Atherosclerotic calcifications noted the carotid bifurcations. Disc levels: Disc space narrowing with endplate spur formation at C5-C6 and C6-C7, less at C4-C5. 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 Impingement upon BILATERAL  cervical neural foramina at C5-C6 and C6-C7 greater on RIGHT. Upper chest: Lung apices clear. Other: N/A IMPRESSION: Atrophy with small vessel chronic ischemic changes of deep cerebral white matter. No acute intracranial abnormalities. 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 No acute cervical spine abnormalities. Carotid vascular calcifications. Electronically Signed   By: Lavonia Dana M.D.   On: 04/06/2017 15:53   Mr Brain Wo Contrast  Result Date: 04/06/2017 CLINICAL DATA:  76 y/o  F; numbness and tingling of the right arm. EXAM: MRI HEAD WITHOUT CONTRAST MRA HEAD WITHOUT CONTRAST TECHNIQUE: Multiplanar, multiecho pulse sequences of the brain and surrounding structures were obtained without intravenous contrast. Angiographic images of the head were obtained using MRA technique without contrast. COMPARISON:  None. FINDINGS: MRI HEAD FINDINGS Brain: No acute infarction, hemorrhage, hydrocephalus, extra-axial collection or mass lesion. Foci of T2 FLAIR hyperintense signal abnormality in subcortical and periventricular white matter is compatible with moderate chronic microvascular ischemic changes. There are a few chronic periventricular lacunar infarcts in the frontal and bilateral parietal lobes, left lentiform nucleus, and right cerebellar hemisphere. Mild brain parenchymal volume loss. Vascular: As below. Skull and upper cervical spine: Normal marrow signal. Sinuses/Orbits: Negative. Other: None. MRA HEAD FINDINGS Internal carotid arteries: Patent. Mild non stenotic irregularity of bilateral carotid and paraclinoid segments is compatible with atherosclerotic changes. Anterior cerebral arteries:  Patent. Middle cerebral arteries: Patent. Anterior communicating artery: Patent. Posterior communicating arteries:  Patent. Posterior cerebral arteries: Patent. Long segments of artifactual signal loss within the P2 segments. Basilar artery:  Patent. Vertebral arteries:  Patent. No evidence  of high-grade stenosis, large vessel occlusion, or aneurysm unless noted above. IMPRESSION: 1. No acute intracranial abnormality identified. 2. Moderate for age chronic microvascular ischemic changes and mild parenchymal volume loss of the brain. 3. Patent circle of Willis without evidence for high-grade stenosis, large vessel occlusion, or aneurysm. Electronically Signed   By: Kristine Garbe M.D.   On: 04/06/2017 21:34   Mr Jodene Nam Head/brain WG Cm  Result Date: 04/06/2017 CLINICAL DATA:  76 y/o  F; numbness and tingling of the right arm. EXAM: MRI HEAD WITHOUT CONTRAST MRA HEAD WITHOUT CONTRAST TECHNIQUE: Multiplanar, multiecho pulse sequences of the brain and surrounding structures were obtained without intravenous contrast. Angiographic images of the head were obtained using MRA technique without contrast. COMPARISON:  None. FINDINGS: MRI HEAD FINDINGS Brain: No acute infarction, hemorrhage, hydrocephalus, extra-axial collection or mass lesion. Foci of T2 FLAIR hyperintense signal abnormality in subcortical and periventricular white matter is compatible with moderate chronic microvascular ischemic changes. There are a few chronic periventricular lacunar infarcts in the frontal and bilateral parietal lobes, left lentiform nucleus, and right cerebellar hemisphere. Mild brain parenchymal volume loss. Vascular: As below. Skull and upper cervical spine: Normal marrow signal. Sinuses/Orbits: Negative. Other: None. MRA HEAD FINDINGS Internal carotid arteries: Patent. Mild non stenotic irregularity of bilateral carotid and paraclinoid segments is compatible with atherosclerotic changes. Anterior cerebral arteries:  Patent. Middle cerebral arteries: Patent. Anterior communicating artery: Patent. Posterior communicating arteries:  Patent. Posterior cerebral arteries: Patent. Long segments of artifactual signal loss within the P2 segments. Basilar artery:  Patent. Vertebral arteries:  Patent. No evidence of  high-grade stenosis, large vessel occlusion, or aneurysm unless noted above. IMPRESSION: 1. No acute intracranial abnormality identified.  2. Moderate for age chronic microvascular ischemic changes and mild parenchymal volume loss of the brain. 3. Patent circle of Willis without evidence for high-grade stenosis, large vessel occlusion, or aneurysm. Electronically Signed   By: Kristine Garbe M.D.   On: 04/06/2017 21:34    Time Spent in minutes  30   Jani Gravel M.D on 04/07/2017 at 6:52 AM  Between 7am to 7pm - Pager - [phone]  After 7pm go to [email or web address] - password [facility]  [facility] -  Office  [phone]

## 2017-04-07 NOTE — Progress Notes (Signed)
VASCULAR LAB PRELIMINARY  PRELIMINARY  PRELIMINARY  PRELIMINARY  Carotid duplex completed.    Preliminary report:  1-39% ICA plaquing. Vertebral artery flow is antegrade.   Mehgan Santmyer, RVT 04/07/2017, 9:03 AM

## 2017-04-07 NOTE — Progress Notes (Signed)
OT Cancellation Note  Patient Details Name: Tina Ellis MRN: [id number] DOB: November 02, 1941   Cancelled Treatment:    Reason Eval/Treat Not Completed: Patient not medically ready.  Patient remains on bedrest per orders.   *MD:  Please write activity orders when appropriate for patient.  OT will initiate evaluation at that time.  Thank you.  Merri Ray Jaylyn Iyer 04/07/2017, 3:29 PM  Hulda Humphrey OTR/L [phone]

## 2017-04-08 ENCOUNTER — Inpatient Hospital Stay (HOSPITAL_COMMUNITY): Payer: Medicare Other

## 2017-04-08 DIAGNOSIS — R000 Tachycardia, unspecified: Secondary | ICD-10-CM | POA: Diagnosis present

## 2017-04-08 DIAGNOSIS — R1012 Left upper quadrant pain: Secondary | ICD-10-CM | POA: Diagnosis not present

## 2017-04-08 DIAGNOSIS — Z7982 Long term (current) use of aspirin: Secondary | ICD-10-CM | POA: Diagnosis not present

## 2017-04-08 DIAGNOSIS — Z801 Family history of malignant neoplasm of trachea, bronchus and lung: Secondary | ICD-10-CM | POA: Diagnosis not present

## 2017-04-08 DIAGNOSIS — R079 Chest pain, unspecified: Secondary | ICD-10-CM | POA: Diagnosis not present

## 2017-04-08 DIAGNOSIS — K219 Gastro-esophageal reflux disease without esophagitis: Secondary | ICD-10-CM | POA: Diagnosis present

## 2017-04-08 DIAGNOSIS — R2990 Unspecified symptoms and signs involving the nervous system: Secondary | ICD-10-CM | POA: Diagnosis not present

## 2017-04-08 DIAGNOSIS — I77819 Aortic ectasia, unspecified site: Secondary | ICD-10-CM | POA: Diagnosis present

## 2017-04-08 DIAGNOSIS — R748 Abnormal levels of other serum enzymes: Secondary | ICD-10-CM | POA: Diagnosis not present

## 2017-04-08 DIAGNOSIS — R7989 Other specified abnormal findings of blood chemistry: Secondary | ICD-10-CM | POA: Diagnosis present

## 2017-04-08 DIAGNOSIS — Z8379 Family history of other diseases of the digestive system: Secondary | ICD-10-CM | POA: Diagnosis not present

## 2017-04-08 DIAGNOSIS — I6789 Other cerebrovascular disease: Secondary | ICD-10-CM | POA: Diagnosis not present

## 2017-04-08 DIAGNOSIS — E7800 Pure hypercholesterolemia, unspecified: Secondary | ICD-10-CM | POA: Diagnosis present

## 2017-04-08 DIAGNOSIS — Z800 Family history of malignant neoplasm of digestive organs: Secondary | ICD-10-CM | POA: Diagnosis not present

## 2017-04-08 DIAGNOSIS — I10 Essential (primary) hypertension: Secondary | ICD-10-CM | POA: Diagnosis not present

## 2017-04-08 DIAGNOSIS — R29818 Other symptoms and signs involving the nervous system: Secondary | ICD-10-CM | POA: Diagnosis not present

## 2017-04-08 DIAGNOSIS — K5730 Diverticulosis of large intestine without perforation or abscess without bleeding: Secondary | ICD-10-CM | POA: Diagnosis not present

## 2017-04-08 DIAGNOSIS — Z79899 Other long term (current) drug therapy: Secondary | ICD-10-CM | POA: Diagnosis not present

## 2017-04-08 DIAGNOSIS — E876 Hypokalemia: Secondary | ICD-10-CM | POA: Diagnosis present

## 2017-04-08 DIAGNOSIS — E785 Hyperlipidemia, unspecified: Secondary | ICD-10-CM | POA: Diagnosis present

## 2017-04-08 DIAGNOSIS — R109 Unspecified abdominal pain: Secondary | ICD-10-CM

## 2017-04-08 DIAGNOSIS — G459 Transient cerebral ischemic attack, unspecified: Secondary | ICD-10-CM | POA: Diagnosis present

## 2017-04-08 LAB — URINALYSIS, ROUTINE W REFLEX MICROSCOPIC
Bilirubin Urine: NEGATIVE
Glucose, UA: NEGATIVE mg/dL
KETONES UR: NEGATIVE mg/dL
Leukocytes, UA: NEGATIVE
Nitrite: NEGATIVE
PH: 5 (ref 5.0–8.0)
PROTEIN: NEGATIVE mg/dL
Specific Gravity, Urine: 1.013 (ref 1.005–1.030)

## 2017-04-08 LAB — VAS US CAROTID
LCCAPDIAS: -23 cm/s
LCCAPSYS: -106 cm/s
LEFT ECA DIAS: -10 cm/s
LEFT VERTEBRAL DIAS: -8 cm/s
LICADDIAS: -27 cm/s
LICAPSYS: 99 cm/s
Left CCA dist dias: -22 cm/s
Left CCA dist sys: -88 cm/s
Left ICA dist sys: -96 cm/s
Left ICA prox dias: 30 cm/s
RIGHT ECA DIAS: -22 cm/s
RIGHT VERTEBRAL DIAS: -16 cm/s
Right CCA prox dias: 25 cm/s
Right CCA prox sys: 110 cm/s
Right cca dist sys: -68 cm/s

## 2017-04-08 LAB — COMPREHENSIVE METABOLIC PANEL
ALT: 11 U/L — ABNORMAL LOW (ref 14–54)
AST: 23 U/L (ref 15–41)
Albumin: 3.5 g/dL (ref 3.5–5.0)
Alkaline Phosphatase: 46 U/L (ref 38–126)
Anion gap: 7 (ref 5–15)
BUN: 12 mg/dL (ref 6–20)
CHLORIDE: 109 mmol/L (ref 101–111)
CO2: 24 mmol/L (ref 22–32)
CREATININE: 0.87 mg/dL (ref 0.44–1.00)
Calcium: 9.3 mg/dL (ref 8.9–10.3)
GFR calc Af Amer: >60 mL/min (ref >60)
Glucose, Bld: 121 mg/dL — ABNORMAL HIGH (ref 65–99)
Potassium: 3.8 mmol/L (ref 3.5–5.1)
Sodium: 140 mmol/L (ref 135–145)
Total Bilirubin: 0.4 mg/dL (ref 0.3–1.2)
Total Protein: 6.5 g/dL (ref 6.5–8.1)

## 2017-04-08 LAB — TROPONIN I
TROPONIN I: 0.06 ng/mL — AB (ref <0.03)
TROPONIN I: 0.25 ng/mL — AB (ref <0.03)
TROPONIN I: 0.24 ng/mL — AB (ref <0.03)

## 2017-04-08 LAB — ECHOCARDIOGRAM COMPLETE
CHL CUP DOP CALC LVOT VTI: 26.6 cm
E/e' ratio: 10.49
FS: 41 % (ref 28–44)
HEIGHTINCHES: 67 in
IV/PV OW: 0.85
LA ID, A-P, ES: 29 mm
LA diam index: 1.55 cm/m2
LA vol A4C: 28.2 ml
LAVOL: 28 mL
LAVOLIN: 15 mL/m2
LEFT ATRIUM END SYS DIAM: 29 mm
LV PW d: 11.9 mm — AB (ref 0.6–1.1)
LVEEAVG: 10.49
LVEEMED: 10.49
LVELAT: 8.38 cm/s
LVOT peak grad rest: 8 mmHg
LVOT peak vel: 145 cm/s
Lateral S' vel: 18.3 cm/s
MV Peak grad: 3 mmHg
MV pk A vel: 137 m/s
MVPKEVEL: 87.9 m/s
RV TAPSE: 22.6 mm
TDI e' lateral: 8.38
TDI e' medial: 5.43
WEIGHTICAEL: 2656 [oz_av]

## 2017-04-08 LAB — HEMOGLOBIN A1C
Hgb A1c MFr Bld: 5.7 % — ABNORMAL HIGH (ref 4.8–5.6)
MEAN PLASMA GLUCOSE: 117 mg/dL

## 2017-04-08 LAB — LIPASE, BLOOD: LIPASE: 32 U/L (ref 11–51)

## 2017-04-08 LAB — CBC
HEMATOCRIT: 40.6 % (ref 36.0–46.0)
Hemoglobin: 13.5 g/dL (ref 12.0–15.0)
MCH: 29 pg (ref 26.0–34.0)
MCHC: 33.3 g/dL (ref 30.0–36.0)
MCV: 87.1 fL (ref 78.0–100.0)
PLATELETS: 287 10*3/uL (ref 150–400)
RBC: 4.66 MIL/uL (ref 3.87–5.11)
RDW: 14.1 % (ref 11.5–15.5)
WBC: 8 10*3/uL (ref 4.0–10.5)

## 2017-04-08 LAB — CK TOTAL AND CKMB (NOT AT ARMC)
CK, MB: 2.1 ng/mL (ref 0.5–5.0)
Relative Index: INVALID (ref 0.0–2.5)
Total CK: 72 U/L (ref 38–234)

## 2017-04-08 LAB — MAGNESIUM: Magnesium: 1.6 mg/dL — ABNORMAL LOW (ref 1.7–2.4)

## 2017-04-08 LAB — TSH: TSH: 2.101 u[IU]/mL (ref 0.350–4.500)

## 2017-04-08 LAB — D-DIMER, QUANTITATIVE: D-Dimer, Quant: 0.31 ug/mL-FEU (ref 0.00–0.50)

## 2017-04-08 MED ORDER — IOPAMIDOL (ISOVUE-300) INJECTION 61%
INTRAVENOUS | Status: AC
  Filled 2017-04-08: qty 30

## 2017-04-08 MED ORDER — ONDANSETRON HCL 4 MG/2ML IJ SOLN
4.0000 mg | Freq: Four times a day (QID) | INTRAMUSCULAR | Status: DC | PRN
  Administered 2017-04-08: 4 mg via INTRAVENOUS
  Filled 2017-04-08: qty 2

## 2017-04-08 MED ORDER — MAGNESIUM SULFATE 2 GM/50ML IV SOLN
2.0000 g | Freq: Once | INTRAVENOUS | Status: AC
  Administered 2017-04-08: 2 g via INTRAVENOUS
  Filled 2017-04-08: qty 50

## 2017-04-08 MED ORDER — LOSARTAN POTASSIUM 50 MG PO TABS
100.0000 mg | ORAL_TABLET | Freq: Every day | ORAL | Status: DC
  Administered 2017-04-08 – 2017-04-10 (×3): 100 mg via ORAL
  Filled 2017-04-08 (×3): qty 2

## 2017-04-08 MED ORDER — ENOXAPARIN SODIUM 80 MG/0.8ML ~~LOC~~ SOLN
75.0000 mg | Freq: Two times a day (BID) | SUBCUTANEOUS | Status: DC
  Administered 2017-04-08 – 2017-04-10 (×4): 75 mg via SUBCUTANEOUS
  Filled 2017-04-08 (×5): qty 0.8

## 2017-04-08 MED ORDER — IOPAMIDOL (ISOVUE-300) INJECTION 61%
INTRAVENOUS | Status: AC
  Administered 2017-04-08: 100 mL
  Filled 2017-04-08: qty 100

## 2017-04-08 MED ORDER — ZOLPIDEM TARTRATE 5 MG PO TABS: 5.0000 mg | ORAL_TABLET | Freq: Every evening | ORAL | Status: DC | PRN

## 2017-04-08 MED ORDER — PANTOPRAZOLE SODIUM 40 MG PO TBEC
40.0000 mg | DELAYED_RELEASE_TABLET | Freq: Two times a day (BID) | ORAL | Status: DC
  Administered 2017-04-08 – 2017-04-10 (×5): 40 mg via ORAL
  Filled 2017-04-08 (×6): qty 1

## 2017-04-08 MED ORDER — POTASSIUM CHLORIDE CRYS ER 20 MEQ PO TBCR
30.0000 meq | EXTENDED_RELEASE_TABLET | Freq: Two times a day (BID) | ORAL | Status: AC
  Administered 2017-04-08 (×2): 30 meq via ORAL
  Filled 2017-04-08 (×2): qty 1

## 2017-04-08 MED ORDER — CARVEDILOL 3.125 MG PO TABS
3.1250 mg | ORAL_TABLET | Freq: Two times a day (BID) | ORAL | Status: DC
  Administered 2017-04-08: 3.125 mg via ORAL
  Filled 2017-04-08: qty 1

## 2017-04-08 NOTE — Progress Notes (Signed)
ANTICOAGULATION CONSULT NOTE - Initial Consult  Pharmacy Consult for Lovenox Indication:  ACS / NSTEMI / CP  No Known Allergies  Patient Measurements: Height: 5\' 7"  (170.2 cm) Weight: 166 lb (75.3 kg) IBW/kg (Calculated) : 61.6  Lovenox dosing weight = TBW =75.3 kg    Vital Signs: Temp: 97.8 F (36.6 C) (04/15 1706) Temp Source: Oral (04/15 1706) BP: 141/83 (04/15 1706) Pulse Rate: 83 (04/15 1706)  Labs:  Recent Labs  04/06/17 1426  04/06/17 1733 04/07/17 0243 04/08/17 1044 04/08/17 1637  HGB 13.0  < > 12.6 12.1 13.5  --   HCT 38.9  < > 37.6 36.2 40.6  --   PLT 264  --  261 257 287  --   APTT 30  --   --   --   --   --   LABPROT 13.6  --   --   --   --   --   INR 1.04  --   --   --   --   --   CREATININE 0.89  < > 0.85 0.89 0.87  --   TROPONINI  --   --   --   --  0.06* 0.25*  < > = values in this interval not displayed.  Estimated Creatinine Clearance: 59.2 mL/min (by C-G formula based on SCr of 0.87 mg/dL).   Medical History: Past Medical History:  Diagnosis Date  . GERD (gastroesophageal reflux disease)   . Hypercholesteremia   . Hypertension   . Seasonal allergies     Medications:  Prescriptions Prior to Admission  Medication Sig Dispense Refill Last Dose  . acetaminophen (TYLENOL) 500 MG tablet Take 500 mg by mouth every 6 (six) hours as needed (for pain).    PRN at PRN  . aspirin 81 MG tablet Take 81 mg by mouth every morning.    04/06/2017 at 0830  . cetirizine (ZYRTEC) 10 MG tablet Take 10 mg by mouth at bedtime.   04/05/2017 at pm  . cholecalciferol (VITAMIN D) 1000 UNITS tablet Take 2,000 Units by mouth every morning.   04/06/2017 at am  . hydrochlorothiazide (HYDRODIURIL) 25 MG tablet Take 25 mg by mouth every morning.   04/06/2017 at am  . losartan (COZAAR) 100 MG tablet Take 100 mg by mouth every morning.   04/06/2017 at am  . omeprazole (PRILOSEC) 20 MG capsule Take 20 mg by mouth every morning.    04/06/2017 at am  . rosuvastatin (CRESTOR) 5 MG  tablet Take 2.5 mg by mouth every evening.    04/05/2017 at pm   Scheduled:  .  stroke: mapping our early stages of recovery book   Does not apply Once  . aspirin  324 mg Oral Daily  . carvedilol  3.125 mg Oral BID WC  . iopamidol      . loratadine  10 mg Oral Daily  . losartan  100 mg Oral Daily  . pantoprazole  40 mg Oral BID  . rosuvastatin  2.5 mg Oral QPM    Assessment: 76 y.o female with troponin elevation. To start on Lovenox for  ACS / NSTEMI / CP.  Lovenox dosing weight = TBW =75.3 kg  SCr 0.87, CrCl ~ 59 ml/min  CBC is wnl.   Patient has received heparin SQ q8h, last dose given @13 :11 today- this has been discontinued now.  Goal of Therapy:  Anti-Xa level 0.6-1 units/ml 4hrs after LMWH dose given Monitor platelets by anticoagulation protocol: Yes   Plan:  SQ heparin discontinued Lovenox 1mg /kg q12h = 75 mg q12h  Monitor for s/sx of bleeding, renal function.   Nicole Cella, [place] [phone] 04/08/2017,5:43 PM

## 2017-04-08 NOTE — Progress Notes (Signed)
PT Cancellation Note  Patient Details Name: Tina Ellis MRN: [id number] DOB: [birth year]   Cancelled Treatment:    Reason Eval/Treat Not Completed: Medical issues which prohibited therapy. Noted pt with elevated troponin. Upon arrival to discuss with RN, transport present to take pt off unit for testing. Will check back as schedule allows to complete PT eval.    Thelma Comp 04/08/2017, 1:40 PM   Rolinda Roan, PT, DPT Acute Rehabilitation Services Pager: [phone]

## 2017-04-08 NOTE — Progress Notes (Signed)
Troponin level results to MD in attendance via [facility] text page.

## 2017-04-08 NOTE — Progress Notes (Signed)
  Echocardiogram 2D Echocardiogram has been performed.  Tina Ellis 04/08/2017, 10:12 AM

## 2017-04-08 NOTE — Progress Notes (Signed)
Patient complaining of nausea provider made aware . Order for Zofran given.

## 2017-04-08 NOTE — Progress Notes (Signed)
No new orders at this time for troponin results, await more labs.

## 2017-04-08 NOTE — Progress Notes (Signed)
Patient ID: JINNY SWEETLAND, female   DOB: 05/28/1941, 76 y.o.   MRN: [id number]                                                                PROGRESS NOTE                                                                                                                                                                                                             Patient Demographics:    Larayah Clute, is a 76 y.o. female, DOB - [birth year], QPY:[id number]  Admit date - 04/06/2017   Admitting Physician Elwin Mocha, MD  Outpatient Primary MD for the patient is Horatio Pel, MD  LOS - 0  Outpatient Specialists: Chief Complaint  Patient presents with  . Hypertension  . Numbness       Brief Narrative  76 y.o.femalewith past medical history of hypertension, elevated cholesterol, reflux and allergies presents with numbness and tingling of the left arm distally. Patient states she's had some issue with her blood pressure over the last few weeks. She was even seen in the emergency room for it. Patient became alarmed when she noted that she had some left arm numbness and tingling. This lasted for several hours. And then went away completely. Patient went to see her primary care doctor who noted a elevated blood pressure in the office and sent the patient to the emergency room for evaluation for possible stroke.  ED course: EDP called neurology who recommended admission for stroke workup. Hospitalist was consulted for admission. CT head negative for acute stroke.    Subjective:    Jazzie Trampe today has this morning developed LUQ pain,  Slight nausea.  Denies fever chills, diarrhea, brbpr, black stool, dysuria, hematuria. Pt also tachycardic on vs.     Assessment  & Plan :    Principal Problem:   Stroke-like symptom Active Problems:   HTN (hypertension)   GERD (gastroesophageal reflux disease)   TIA (transient ischemic attack)   LUQ pain Cbc, cmp, lipase, ua CT scan abd  pelvis protonix 40mg  po bid  Tachycardia Check tsh, check trop I q6h x3  L arm numbness, tingling ddx tia vs cva  Awaiting carotid u/s, cardiac echo full results Cont aspirin, crestor Appreciate neurology input  Hypokalemia Check  cmp   Hypomagnesemia Start mag oxide 400mg  po qday Check magnesium in am  Hypertension When necessary hydralazine 10 mg IV as needed for severe blood pressure Restart cozaar   Hyperlipidemia Continue statin  GERD Cont PPI  Code Status:FULL DVT Prophylaxis: Heparin Family Communication:pt by phone with family Disposition Plan:Pending Improvement     Lab Results  Component Value Date   PLT 257 04/07/2017      Anti-infectives    None        Objective:   Vitals:   04/08/17 0137 04/08/17 0139 04/08/17 0222 04/08/17 0550  BP: (!) 186/116 (!) 175/110 (!) 178/86 (!) 166/78  Pulse: 97  100 (!) 120  Resp: 20   20  Temp: 98.4 F (36.9 C)   98.1 F (36.7 C)  TempSrc: Oral   Oral  SpO2: 99%  97% 95%  Weight:      Height:        Wt Readings from Last 3 Encounters:  04/07/17 75.3 kg (166 lb)  02/23/17 70.3 kg (155 lb)  12/14/13 70.7 kg (155 lb 13.8 oz)     Intake/Output Summary (Last 24 hours) at 04/08/17 0715 Last data filed at 04/08/17 0600  Gross per 24 hour  Intake              600 ml  Output                0 ml  Net              600 ml     Physical Exam  Awake Alert, Oriented X 3, No new F.N deficits, Normal affect [place].AT,PERRAL Supple Neck,No JVD, No cervical lymphadenopathy appriciated.  Symmetrical Chest wall movement, Good air movement bilaterally, CTAB RRR,No Gallops,Rubs or new Murmurs, No Parasternal Heave +ve B.Sounds, Abd Soft, No tenderness, No organomegaly appriciated, No rebound - guarding or rigidity. No Cyanosis, Clubbing or edema, No new Rash or bruise       Data Review:    CBC  Recent Labs Lab 04/06/17 1426 04/06/17 1442 04/06/17 1733 04/07/17 0243  WBC 7.6  --  7.0 7.0    HGB 13.0 13.3 12.6 12.1  HCT 38.9 39.0 37.6 36.2  PLT 264  --  261 257  MCV 87.4  --  87.0 87.0  MCH 29.2  --  29.2 29.1  MCHC 33.4  --  33.5 33.4  RDW 13.8  --  13.7 13.8  LYMPHSABS 1.5  --   --  2.2  MONOABS 0.6  --   --  0.7  EOSABS 0.4  --   --  0.7  BASOSABS 0.1  --   --  0.1    Chemistries   Recent Labs Lab 04/06/17 1426 04/06/17 1442 04/06/17 1733 04/07/17 0243  NA 139 139  --  140  K 4.0 4.0  --  3.0*  CL 105 105  --  105  CO2 24  --   --  26  GLUCOSE 100* 100*  --  100*  BUN 17 20  --  13  CREATININE 0.89 0.90 0.85 0.89  CALCIUM 9.6  --   --  9.1  MG  --   --  1.4*  --   AST 16  --   --   --   ALT 12*  --   --   --   ALKPHOS 47  --   --   --   BILITOT 0.4  --   --   --    ------------------------------------------------------------------------------------------------------------------  Recent Labs  04/07/17 0243  CHOL 172  HDL 41  LDLCALC 101*  TRIG 148  CHOLHDL 4.2    No results found for: HGBA1C ------------------------------------------------------------------------------------------------------------------ No results for input(s): TSH, T4TOTAL, T3FREE, THYROIDAB in the last 72 hours.  Invalid input(s): FREET3 ------------------------------------------------------------------------------------------------------------------ No results for input(s): VITAMINB12, FOLATE, FERRITIN, TIBC, IRON, RETICCTPCT in the last 72 hours.  Coagulation profile  Recent Labs Lab 04/06/17 1426  INR 1.04    No results for input(s): DDIMER in the last 72 hours.  Cardiac Enzymes No results for input(s): CKMB, TROPONINI, MYOGLOBIN in the last 168 hours.  Invalid input(s): CK ------------------------------------------------------------------------------------------------------------------ No results found for: BNP  Inpatient Medications  Scheduled Meds: .  stroke: mapping our early stages of recovery book   Does not apply Once  . aspirin  324 mg Oral  Daily  . heparin  5,000 Units Subcutaneous Q8H  . loratadine  10 mg Oral Daily  . magnesium oxide  400 mg Oral Daily  . pantoprazole  40 mg Oral Daily  . rosuvastatin  2.5 mg Oral QPM   Continuous Infusions: PRN Meds:.acetaminophen **OR** acetaminophen (TYLENOL) oral liquid 160 mg/5 mL **OR** acetaminophen, hydrALAZINE, ondansetron (ZOFRAN) IV, senna-docusate  Micro Results No results found for this or any previous visit (from the past 240 hour(s)).  Radiology Reports Ct Head Wo Contrast  Result Date: 04/06/2017 CLINICAL DATA:  Stroke symptoms greater than 8 hours, LEFT side pain and weakness, LEFT hand weakness, high blood pressure and headaches for 1 week, history hypertension, hypercholesterolemia EXAM: CT HEAD WITHOUT CONTRAST CT CERVICAL SPINE WITHOUT CONTRAST TECHNIQUE: Multidetector CT imaging of the head and cervical spine was performed following the standard protocol without intravenous contrast. Multiplanar CT image reconstructions of the cervical spine were also generated. COMPARISON:  None FINDINGS: CT HEAD FINDINGS Brain: Generalized atrophy. Normal ventricular morphology. No midline shift or mass effect. Small vessel chronic ischemic changes of deep cerebral white matter. No intracranial hemorrhage, mass lesion, evidence of acute infarction, or extra-axial fluid collection. Vascular: Atherosclerotic calcification of internal carotid arteries at skullbase. Skull: Intact Sinuses/Orbits: Clear Other: N/A CT CERVICAL SPINE FINDINGS Alignment: Normal Skull base and vertebrae: Visualized skullbase intact. Osseous mineralization mildly decreased diffusely. Scattered facet degenerative changes. Vertebral body heights maintained without fracture or bone destruction. Soft tissues and spinal canal: Prevertebral soft tissues normal thickness. Soft tissues otherwise unremarkable. Atherosclerotic calcifications noted the carotid bifurcations. Disc levels: Disc space narrowing with endplate spur  formation at C5-C6 and C6-C7, less at C4-C5. 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 Impingement upon BILATERAL cervical neural foramina at C5-C6 and C6-C7 greater on RIGHT. Upper chest: Lung apices clear. Other: N/A IMPRESSION: Atrophy with small vessel chronic ischemic changes of deep cerebral white matter. No acute intracranial abnormalities. 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 No acute cervical spine abnormalities. Carotid vascular calcifications. Electronically Signed   By: Lavonia Dana M.D.   On: 04/06/2017 15:53   Ct Cervical Spine Wo Contrast  Result Date: 04/06/2017 CLINICAL DATA:  Stroke symptoms greater than 8 hours, LEFT side pain and weakness, LEFT hand weakness, high blood pressure and headaches for 1 week, history hypertension, hypercholesterolemia EXAM: CT HEAD WITHOUT CONTRAST CT CERVICAL SPINE WITHOUT CONTRAST TECHNIQUE: Multidetector CT imaging of the head and cervical spine was performed following the standard protocol without intravenous contrast. Multiplanar CT image reconstructions of the cervical spine were also generated. COMPARISON:  None FINDINGS: CT HEAD FINDINGS Brain: Generalized atrophy. Normal ventricular morphology. No midline shift or mass  effect. Small vessel chronic ischemic changes of deep cerebral white matter. No intracranial hemorrhage, mass lesion, evidence of acute infarction, or extra-axial fluid collection. Vascular: Atherosclerotic calcification of internal carotid arteries at skullbase. Skull: Intact Sinuses/Orbits: Clear Other: N/A CT CERVICAL SPINE FINDINGS Alignment: Normal Skull base and vertebrae: Visualized skullbase intact. Osseous mineralization mildly decreased diffusely. Scattered facet degenerative changes. Vertebral body heights maintained without fracture or bone destruction. Soft tissues and spinal canal: Prevertebral soft tissues normal thickness. Soft tissues otherwise unremarkable.  Atherosclerotic calcifications noted the carotid bifurcations. Disc levels: Disc space narrowing with endplate spur formation at C5-C6 and C6-C7, less at C4-C5. 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 Impingement upon BILATERAL cervical neural foramina at C5-C6 and C6-C7 greater on RIGHT. Upper chest: Lung apices clear. Other: N/A IMPRESSION: Atrophy with small vessel chronic ischemic changes of deep cerebral white matter. No acute intracranial abnormalities. 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 No acute cervical spine abnormalities. Carotid vascular calcifications. Electronically Signed   By: Lavonia Dana M.D.   On: 04/06/2017 15:53   Mr Brain Wo Contrast  Result Date: 04/06/2017 CLINICAL DATA:  76 y/o  F; numbness and tingling of the right arm. EXAM: MRI HEAD WITHOUT CONTRAST MRA HEAD WITHOUT CONTRAST TECHNIQUE: Multiplanar, multiecho pulse sequences of the brain and surrounding structures were obtained without intravenous contrast. Angiographic images of the head were obtained using MRA technique without contrast. COMPARISON:  None. FINDINGS: MRI HEAD FINDINGS Brain: No acute infarction, hemorrhage, hydrocephalus, extra-axial collection or mass lesion. Foci of T2 FLAIR hyperintense signal abnormality in subcortical and periventricular white matter is compatible with moderate chronic microvascular ischemic changes. There are a few chronic periventricular lacunar infarcts in the frontal and bilateral parietal lobes, left lentiform nucleus, and right cerebellar hemisphere. Mild brain parenchymal volume loss. Vascular: As below. Skull and upper cervical spine: Normal marrow signal. Sinuses/Orbits: Negative. Other: None. MRA HEAD FINDINGS Internal carotid arteries: Patent. Mild non stenotic irregularity of bilateral carotid and paraclinoid segments is compatible with atherosclerotic changes. Anterior cerebral arteries:  Patent. Middle cerebral arteries: Patent.  Anterior communicating artery: Patent. Posterior communicating arteries:  Patent. Posterior cerebral arteries: Patent. Long segments of artifactual signal loss within the P2 segments. Basilar artery:  Patent. Vertebral arteries:  Patent. No evidence of high-grade stenosis, large vessel occlusion, or aneurysm unless noted above. IMPRESSION: 1. No acute intracranial abnormality identified. 2. Moderate for age chronic microvascular ischemic changes and mild parenchymal volume loss of the brain. 3. Patent circle of Willis without evidence for high-grade stenosis, large vessel occlusion, or aneurysm. Electronically Signed   By: Kristine Garbe M.D.   On: 04/06/2017 21:34   Mr Jodene Nam Head/brain VQ Cm  Result Date: 04/06/2017 CLINICAL DATA:  76 y/o  F; numbness and tingling of the right arm. EXAM: MRI HEAD WITHOUT CONTRAST MRA HEAD WITHOUT CONTRAST TECHNIQUE: Multiplanar, multiecho pulse sequences of the brain and surrounding structures were obtained without intravenous contrast. Angiographic images of the head were obtained using MRA technique without contrast. COMPARISON:  None. FINDINGS: MRI HEAD FINDINGS Brain: No acute infarction, hemorrhage, hydrocephalus, extra-axial collection or mass lesion. Foci of T2 FLAIR hyperintense signal abnormality in subcortical and periventricular white matter is compatible with moderate chronic microvascular ischemic changes. There are a few chronic periventricular lacunar infarcts in the frontal and bilateral parietal lobes, left lentiform nucleus, and right cerebellar hemisphere. Mild brain parenchymal volume loss. Vascular: As below. Skull and upper cervical spine: Normal marrow signal. Sinuses/Orbits: Negative. Other: None. MRA HEAD FINDINGS  Internal carotid arteries: Patent. Mild non stenotic irregularity of bilateral carotid and paraclinoid segments is compatible with atherosclerotic changes. Anterior cerebral arteries:  Patent. Middle cerebral arteries: Patent. Anterior  communicating artery: Patent. Posterior communicating arteries:  Patent. Posterior cerebral arteries: Patent. Long segments of artifactual signal loss within the P2 segments. Basilar artery:  Patent. Vertebral arteries:  Patent. No evidence of high-grade stenosis, large vessel occlusion, or aneurysm unless noted above. IMPRESSION: 1. No acute intracranial abnormality identified. 2. Moderate for age chronic microvascular ischemic changes and mild parenchymal volume loss of the brain. 3. Patent circle of Willis without evidence for high-grade stenosis, large vessel occlusion, or aneurysm. Electronically Signed   By: Kristine Garbe M.D.   On: 04/06/2017 21:34    Time Spent in minutes  30   Jani Gravel M.D on 04/08/2017 at 7:15 AM  Between 7am to 7pm - Pager - [phone]  After 7pm go to [email or web address] - password [email or web address]  [facility] -  Office  [phone]

## 2017-04-08 NOTE — Progress Notes (Signed)
Patient admitted with stroke like symptoms. Contacted by Dr Maudie Mercury for nonurgent consult to be added on for tomorrow rounds for mild troponin elevation. Patient without chest pain, normal EKG. Will add patient to rounding list for tomorrow.   Carlyle Dolly MD

## 2017-04-08 NOTE — Progress Notes (Signed)
Patient resting in bed. EKG done at bedside.  No complaints of pain or discomfort.

## 2017-04-08 NOTE — Progress Notes (Signed)
OT Cancellation Note  Patient Details Name: CAMYLA CAMPOSANO MRN: [id number] DOB: 03/25/1941   Cancelled Treatment:    Reason Eval/Treat Not Completed: Medical issues which prohibited therapy. Pt with elevated troponin. Additionally off unit for testing at this time. Will check back as able and appropriate for OT evaluation.  Norman Herrlich, MS OTR/L  Pager: [place] A Reneisha Stilley 04/08/2017, 2:06 PM

## 2017-04-09 ENCOUNTER — Inpatient Hospital Stay (HOSPITAL_COMMUNITY): Payer: Medicare Other

## 2017-04-09 DIAGNOSIS — R748 Abnormal levels of other serum enzymes: Secondary | ICD-10-CM

## 2017-04-09 DIAGNOSIS — R079 Chest pain, unspecified: Secondary | ICD-10-CM

## 2017-04-09 DIAGNOSIS — R778 Other specified abnormalities of plasma proteins: Secondary | ICD-10-CM

## 2017-04-09 DIAGNOSIS — R7989 Other specified abnormal findings of blood chemistry: Secondary | ICD-10-CM

## 2017-04-09 DIAGNOSIS — I10 Essential (primary) hypertension: Secondary | ICD-10-CM

## 2017-04-09 DIAGNOSIS — R29818 Other symptoms and signs involving the nervous system: Secondary | ICD-10-CM

## 2017-04-09 LAB — NM MYOCAR MULTI W/SPECT W/WALL MOTION / EF
CHL CUP NUCLEAR SSS: 11
CSEPHR: 81 %
CSEPPHR: 118 {beats}/min
LV dias vol: 54 mL (ref 46–106)
LVSYSVOL: 15 mL
MPHR: 145 {beats}/min
RATE: 0.05
Rest HR: 86 {beats}/min
SDS: 7
SRS: 4
TID: 1.21

## 2017-04-09 LAB — TROPONIN I: Troponin I: 0.21 ng/mL (ref <0.03)

## 2017-04-09 LAB — MAGNESIUM: Magnesium: 2.2 mg/dL (ref 1.7–2.4)

## 2017-04-09 MED ORDER — ROSUVASTATIN CALCIUM 5 MG PO TABS
10.0000 mg | ORAL_TABLET | Freq: Every evening | ORAL | Status: DC
  Administered 2017-04-09: 10 mg via ORAL
  Filled 2017-04-09: qty 2

## 2017-04-09 MED ORDER — REGADENOSON 0.4 MG/5ML IV SOLN
INTRAVENOUS | Status: AC
  Administered 2017-04-09: 0.4 mg via INTRAVENOUS
  Filled 2017-04-09: qty 5

## 2017-04-09 MED ORDER — REGADENOSON 0.4 MG/5ML IV SOLN
INTRAVENOUS | Status: AC
  Filled 2017-04-09: qty 5

## 2017-04-09 MED ORDER — CARVEDILOL 12.5 MG PO TABS
12.5000 mg | ORAL_TABLET | Freq: Two times a day (BID) | ORAL | Status: DC
Start: 2017-04-09 — End: 2017-04-10
  Administered 2017-04-09 – 2017-04-10 (×3): 12.5 mg via ORAL
  Filled 2017-04-09 (×3): qty 1

## 2017-04-09 MED ORDER — HYDROCHLOROTHIAZIDE 25 MG PO TABS
25.0000 mg | ORAL_TABLET | Freq: Every day | ORAL | Status: DC
  Administered 2017-04-09 – 2017-04-10 (×2): 25 mg via ORAL
  Filled 2017-04-09 (×2): qty 1

## 2017-04-09 MED ORDER — TECHNETIUM TC 99M TETROFOSMIN IV KIT
10.0000 | PACK | Freq: Once | INTRAVENOUS | Status: AC | PRN
  Administered 2017-04-09: 10 via INTRAVENOUS

## 2017-04-09 MED ORDER — TECHNETIUM TC 99M TETROFOSMIN IV KIT
30.0000 | PACK | Freq: Once | INTRAVENOUS | Status: AC | PRN
  Administered 2017-04-09: 30 via INTRAVENOUS

## 2017-04-09 MED ORDER — REGADENOSON 0.4 MG/5ML IV SOLN
0.4000 mg | Freq: Once | INTRAVENOUS | Status: AC
  Administered 2017-04-09: 0.4 mg via INTRAVENOUS
  Filled 2017-04-09: qty 5

## 2017-04-09 NOTE — Evaluation (Signed)
Speech Language Pathology Evaluation Patient Details Name: LAYNEY GILLSON MRN: [id number] DOB: 12-Sep-1941 Today's Date: 04/09/2017 Time: 1020-1101 SLP Time Calculation (min) (ACUTE ONLY): 41 min  Problem List:  Patient Active Problem List   Diagnosis Date Noted  . Elevated troponin 04/09/2017  . TIA (transient ischemic attack) 04/08/2017  . Abdominal pain   . Stroke-like symptom 04/06/2017  . HTN (hypertension) 04/06/2017  . GERD (gastroesophageal reflux disease) 04/06/2017  . Influenza A 12/15/2013  . Hypotension 12/14/2013  . Acute renal failure ([place]) 12/14/2013  . UTI (lower urinary tract infection) 12/13/2013  . Hypotension, unspecified 12/13/2013  . Dehydration 12/13/2013   Past Medical History:  Past Medical History:  Diagnosis Date  . GERD (gastroesophageal reflux disease)   . Hypercholesteremia   . Hypertension   . Seasonal allergies    Past Surgical History:  Past Surgical History:  Procedure Laterality Date  . BACK SURGERY     HPI:  76 yo female adm to [facility] with left upper extremity tingling - found to have elevated troponin.  Pt MRI negative for acute event - chronic ischemia.  Pt with possible TIA, has h/o GERD, back surgery,  HTN.     Assessment / Plan / Recommendation Clinical Impression  Pt scoring on MOCA was 21/30- indicative of mild deficit.  Areas of strength included visuospatial/executive functioning, language, and attention.  Deficits noted in areas of memory - retrieval-, abstract thought, and math.  Given pt with negative MRI finding, suspect pt with mild chronic difficulties.  Pt with no dysarthria or aphasia during evaluation.  SLP educated pt to findings/recommendations and provided memory compensation strategies in writing as pt is still working.  No follow up indicated.      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    Follow Up Recommendations  None    Frequency and Duration           SLP Evaluation Cognition  Overall Cognitive Status: Within Functional Limits for tasks assessed Arousal/Alertness: Awake/alert Orientation Level: Oriented X4 Attention: Sustained Sustained Attention: Appears intact Memory: Impaired Memory Impairment: Retrieval deficit (recalled 2/5 items without cues, 3/5 with cues) Awareness: Appears intact Problem Solving: Appears intact Safety/Judgment: Appears intact       Comprehension  Auditory Comprehension Overall Auditory Comprehension: Appears within functional limits for tasks assessed Yes/No Questions: Not tested Commands: Within Functional Limits Conversation: Complex Interfering Components: Processing speed Visual Recognition/Discrimination Discrimination: Within Function Limits Reading Comprehension Reading Status: Within funtional limits    Expression Expression Primary Mode of Expression: Verbal Verbal Expression Overall Verbal Expression: Appears within functional limits for tasks assessed Initiation: No impairment Level of Generative/Spontaneous Verbalization: Conversation Repetition: No impairment Naming: No impairment Pragmatics: No impairment Written Expression Dominant Hand: Right Written Expression: Within Functional Limits   Oral / Motor  Oral Motor/Sensory Function Overall Oral Motor/Sensory Function: Within functional limits Motor Speech Overall Motor Speech: Appears within functional limits for tasks assessed Respiration: Within functional limits Resonance: Within functional limits Articulation: Within functional limitis Motor Planning: Witnin functional limits   GO                    Luanna Salk, [place] SLP [phone]

## 2017-04-09 NOTE — Progress Notes (Signed)
Patient ambulated with stand-by assist from bed to bathroom and back to bed. Patient states "little dizziness" when she initially stood up. Patient states she "usually has to sit on side of bed for a little bit before getting up at home." RN instructed patient to find focal point on wall to focus on. Patient stated focal point helped. Patient back to bed, call light and telephone within patient reach. RN will continue to monitor.

## 2017-04-09 NOTE — Progress Notes (Signed)
MD notified of patient troponin level 0.24, RN awaits notification of need to continue monitoring patients troponin. Magnesium lab order entered for 04/09/2017 per Jani Gravel, MD transcribed progress note. Patient resting comfortably, BP controlled at this time with no need of PRN medication. Patient denies any pain or discomfort. RN will continue to monitor patient.

## 2017-04-09 NOTE — Care Management Note (Signed)
Case Management Note  Patient Details  Name: Tina Ellis MRN: [id number] Date of Birth: 10-03-1941  Subjective/Objective:   Pt in with stroke like symptoms. She is from home with family.                 Action/Plan: Awaiting PT/OT recommendations. CM following for d/c needs, physician orders.   Expected Discharge Date:  04/08/17               Expected Discharge Plan:     In-House Referral:     Discharge planning Services     Post Acute Care Choice:    Choice offered to:     DME Arranged:    DME Agency:     HH Arranged:    HH Agency:     Status of Service:  In process, will continue to follow  If discussed at Long Length of Stay Meetings, dates discussed:    Additional Comments:  Pollie Friar, RN 04/09/2017, 10:42 AM

## 2017-04-09 NOTE — Evaluation (Signed)
Occupational Therapy Evaluation and Discharge Patient Details Name: Tina Ellis MRN: [id number] DOB: 1941/05/23 Today's Date: 04/09/2017    History of Present Illness Tina Ellis is a 76 y.o. female  with past medical history of hypertension, elevated cholesterol, reflux and allergies presents with numbness and tingling of the left arm distally and elevated BP. MRI negative   Clinical Impression   This 76 yo female admitted with above presents to acute OT at an independent level (does have some mild balance issues with dynamic standing, but is able to manage these). We will D/C from acute OT.     Follow Up Recommendations  No OT follow up    Equipment Recommendations  None recommended by OT       Precautions / Restrictions Precautions Precautions: Fall Restrictions Weight Bearing Restrictions: No      Mobility Bed Mobility Overal bed mobility: Independent                Transfers Overall transfer level: Independent                    Balance Overall balance assessment: Needs assistance Sitting-balance support: No upper extremity supported;Feet supported Sitting balance-Leahy Scale: Normal         Standing balance comment: static standing--good; dynamic turn pt felt as though she need to reach for rail in hallway                           ADL either performed or assessed with clinical judgement   ADL Overall ADL's : Independent                                             Vision Baseline Vision/History: Wears glasses Wears Glasses: At all times (tri-focal) Patient Visual Report: No change from baseline Vision Assessment?: Yes Eye Alignment: Within Functional Limits Ocular Range of Motion: Within Functional Limits Alignment/Gaze Preference: Within Defined Limits Tracking/Visual Pursuits: Able to track stimulus in all quads without difficulty Saccades: Within functional limits Convergence: Within functional  limits Visual Fields: No apparent deficits Additional Comments: Pt did note dizziness when having her track in circular pattern (made PT assigned to her today aware so she could check for possible vestibular issues)            Pertinent Vitals/Pain Pain Assessment: Faces Pain Location: chronic left back pain, h/o back surgery     Hand Dominance Right   Extremity/Trunk Assessment Upper Extremity Assessment Upper Extremity Assessment: Overall WFL for tasks assessed           Communication Communication Communication: No difficulties   Cognition Arousal/Alertness: Awake/alert Behavior During Therapy: WFL for tasks assessed/performed Overall Cognitive Status: Within Functional Limits for tasks assessed                                                Home Living Family/patient expects to be discharged to:: Private residence Living Arrangements: Children (son and grandson) Available Help at Discharge: Family;Available PRN/intermittently Type of Home: House Home Access: Stairs to enter Entrance Stairs-Number of Steps: 5 Entrance Stairs-Rails: Right Home Layout:  (one step inside house no rail and 3 steps to where laundry is no rail)  Bathroom Shower/Tub: Agricultural consultant: Handicapped height     Home Equipment: Hand held shower head;Grab bars - tub/shower;Grab bars - toilet      Lives With: Family    Prior Functioning/Environment          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        OT Problem List: Impaired balance (sitting and/or standing)         OT Goals(Current goals can be found in the care plan section) Acute Rehab OT Goals Patient Stated Goal: get tests done and be able to go home  OT Frequency:                End of Session Equipment Utilized During Treatment: Gait belt  Activity Tolerance: Patient tolerated treatment well Patient left: in bed;with call bell/phone within  reach  OT Visit Diagnosis: Unsteadiness on feet (R26.81)                Time: 1122-1140 OT Time Calculation (min): 18 min Charges:  OT General Charges $OT Visit: 1 Procedure OT Evaluation $OT Eval Moderate Complexity: 1 Procedure Golden Circle, OTR/L [phone] 04/09/2017

## 2017-04-09 NOTE — Progress Notes (Signed)
Troponin level ordered for 0430. RN will continue to monitor.

## 2017-04-09 NOTE — Progress Notes (Signed)
   Tina Ellis presented for a lexiscan cardiolite today.  No immediate complications.  Stress imaging is pending at this time.  [name], PA-C 04/09/2017, 1:27 PM

## 2017-04-09 NOTE — Evaluation (Signed)
Physical Therapy Evaluation Patient Details Name: Tina Ellis MRN: [id number] DOB: 1941/05/18 Today's Date: 04/09/2017   History of Present Illness  ROGER FASNACHT is a 76 y.o. female  with past medical history of hypertension, elevated cholesterol, reflux and allergies presents with numbness and tingling of the left arm distally and elevated BP. MRI negative  Clinical Impression  Patient presents with decreased mobility due to dizziness and imbalance at times.  Did not mobilize with PT this pm, but was informed by OT about dizziness with turns and eye movements.  Vestibular assessment reveals potential hypofunction, but not typical presentation so possibly a recurrence due to recent events and bedrest, but will benefit from further skilled PT in the acute setting to maximize independence and safety and for determining fall risk.  Likely no need for follow up PT at d/c.     Follow Up Recommendations No PT follow up    Equipment Recommendations  Other (comment) (TBA)    Recommendations for Other Services       Precautions / Restrictions Precautions Precautions: Fall      Mobility  Bed Mobility Overal bed mobility: Independent                Transfers                 General transfer comment: deferred for vestibular testing and pt feeling fatigued following stress test  Ambulation/Gait                Stairs            Wheelchair Mobility    Modified Rankin (Stroke Patients Only)       Balance     Sitting balance-Leahy Scale: Normal                                       Pertinent Vitals/Pain Pain Assessment: No/denies pain    Home Living Family/patient expects to be discharged to:: Private residence Living Arrangements: Children (son and grandson) Available Help at Discharge: Family;Available PRN/intermittently Type of Home: House Home Access: Stairs to enter Entrance Stairs-Rails: Right Entrance Stairs-Number of  Steps: 5 Home Layout: Multi-level Home Equipment: Hand held shower head;Grab bars - tub/shower;Grab bars - toilet      Prior Function           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     Hand Dominance   Dominant Hand: Right    Extremity/Trunk Assessment   Upper Extremity Assessment Upper Extremity Assessment: Defer to OT evaluation    Lower Extremity Assessment Lower Extremity Assessment: Overall WFL for tasks assessed       Communication   Communication: No difficulties  Cognition Arousal/Alertness: Awake/alert Behavior During Therapy: WFL for tasks assessed/performed Overall Cognitive Status: Within Functional Limits for tasks assessed                                        General Comments General comments (skin integrity, edema, etc.): Educated in vestibular pathology and possible course based on dx.      Vestibular Assessment - 04/09/17 0001      Vestibular Assessment   General Observation Patient denies specific episodes of spinning initially, just describing difficulty with turning around on stairs and with some tasks involving eye movement.  Denies noticeable hearing changes, or history of vertigo, but has long history of allergies.  Noted keeps head turned to R and has more limited eye ROM to L due to head positioning     Symptom Behavior   Type of Dizziness Blurred vision   Frequency of Dizziness intermittent   Duration of Dizziness moments/seconds   Aggravating Factors Moving eyes;Turning head quickly;Supine to sit   Relieving Factors Head stationary;Rest     Occulomotor Exam   Occulomotor Alignment Normal   Spontaneous Absent   Gaze-induced Absent   Head shaking Horizontal Absent   Smooth Pursuits Intact  but slightly jerky to l   Saccades Intact     Vestibulo-Occular Reflex   VOR 1 Head Only (x 1 viewing) 30 sec horizontal and vertical head movements with min cues for target maintenance and moderate  dizziness with horizontal head movement   VOR to Slow Head Movement Positive left   VOR Cancellation Normal     Auditory   Comments hearing intact to scratch test, but diminished in R ear compared to L     Positional Testing   Sidelying Test Sidelying Right;Sidelying Left     Sidelying Right   Sidelying Right Duration 30 sec   Sidelying Right Symptoms No nystagmus     Sidelying Left   Sidelying Left Duration 30 sec   Sidelying Left Symptoms No nystagmus      Exercises     Assessment/Plan    PT Assessment Patient needs continued PT services  PT Problem List Decreased mobility;Decreased activity tolerance;Decreased balance       PT Treatment Interventions Balance training;Functional mobility training;Neuromuscular re-education;Therapeutic exercise;Patient/family education;Therapeutic activities;Gait training;Stair training    PT Goals (Current goals can be found in the Care Plan section)  Acute Rehab PT Goals Patient Stated Goal: get tests done and be able to go home PT Goal Formulation: With patient Time For Goal Achievement: 04/11/17 Potential to Achieve Goals: Good    Frequency Min 3X/week   Barriers to discharge        Co-evaluation               End of Session   Activity Tolerance: Patient limited by fatigue Patient left: in bed;with call bell/phone within reach;with family/visitor present   PT Visit Diagnosis: Dizziness and giddiness (R42);Other abnormalities of gait and mobility (R26.89)    Time: 1517-6160 PT Time Calculation (min) (ACUTE ONLY): 28 min   Charges:   PT Evaluation $PT Eval Moderate Complexity: 1 Procedure PT Treatments $Neuromuscular Re-education: 8-22 mins   PT G CodesMagda Kiel, [place] [phone] 04/09/2017   Reginia Naas 04/09/2017, 5:47 PM

## 2017-04-09 NOTE — Consult Note (Signed)
           [facility] CM Primary Care Navigator  04/09/2017  KARIN PINEDO 08-21-1941 [id number]   Went to see patientat the bedside to identify possible discharge needsbut RNreports that patient is off the unit for Lexiscan Myoview (cardiac x-ray) at this moment.  Will attempt to meet with patient at another time, when available in the room.  For questions, please contact:  Dannielle Huh, BSN, RN- [facility] Primary Care Navigator  Telephone: [phone] [place]

## 2017-04-09 NOTE — Progress Notes (Signed)
Patient ID: Tina Ellis, female   DOB: 1941-07-03, 76 y.o.   MRN: [id number]                                                                PROGRESS NOTE                                                                                                                                                                                                             Patient Demographics:    Tina Ellis, is a 76 y.o. female, DOB - Oct 30, 1941, SFK:[id number]  Admit date - 04/06/2017   Admitting Physician Elwin Mocha, MD  Outpatient Primary MD for the patient is Horatio Pel, MD  LOS - 1  Outpatient Specialists:  Chief Complaint  Patient presents with  . Hypertension  . Numbness       Brief Narrative   76 y.o.femalewith past medical history of hypertension, elevated cholesterol, reflux and allergies presents with numbness and tingling of the left arm distally. Patient states she's had some issue with her blood pressure over the last few weeks. She was even seen in the emergency room for it. Patient became alarmed when she noted that she had some left arm numbness and tingling. This lasted for several hours. And then went away completely. Patient went to see her primary care doctor who noted a elevated blood pressure in the office and sent the patient to the emergency room for evaluation for possible stroke.  ED course: EDP called neurology who recommended admission for stroke workup. Hospitalist was consulted for admission. CT head negative for acute stroke.    Subjective:    Tina Ellis today has sligh residual LUQ pain, otherwise, afebrile.  denies cp, palp, sob, n/v, diarrhea, brbpr, black stool.     Assessment  & Plan :    Principal Problem:   Stroke-like symptom Active Problems:   HTN (hypertension)   GERD (gastroesophageal reflux disease)   TIA (transient ischemic attack)   Abdominal pain   Trop elevation (no chest pain) Cardiology has been consulted and will  be by this asm Cont aspirin, crestor, carvedilol, losartan, lovenox 54m /kg [place] bid  LUQ pain improving,  CT scan abd pelvis  => circumferential thickening proximal stomach, gastritis favored but can't exclude gastric neoplasm, EGD recommended, pt  can have this as outpatient protonix 58m po bid  Tachycardia resolved   tsh nl  L arm numbness, tingling ddx tia vs cva  MRI/MRA brain negative Awaiting carotid u/s, cardiac echo =>nl EF,   Cont aspirin, crestor Appreciate neurology input  Hypokalemia Check cmp   Hypomagnesemia Start mag oxide 4048mpo qday Check magnesium in am  Hypertension When necessary hydralazine 10 mg IV as needed for severe blood pressure Cont cozaar 10036mo qday, started carvedilol yesterday,  Will increase to 12.5mg51m bid.   Hyperlipidemia Continue statin Increase crestor to 10mg60mucose intolerance (hga1c=5.7)  GERD Cont PPI  Code Status:FULL DVT Prophylaxis: Heparin Family Communication:pt by phone with family Disposition Plan:Pending Improvement     Lab Results  Component Value Date   PLT 287 04/08/2017    Antibiotics  :  none  Anti-infectives    None        Objective:   Vitals:   04/08/17 1706 04/08/17 2139 04/09/17 0122 04/09/17 0453  BP: (!) 141/83 (!) 141/79 (!) 151/73 (!) 149/67  Pulse: 83 89 86 79  Resp: _0 Temp: 97.8 F (36.6 C) 98.5 F (36.9 C) 98.6 F (37 C) 98.7 F (37.1 C)  TempSrc: Oral Oral Oral Oral  SpO2: 99% 97% 96% 97%  Weight:      Height:        Wt Readings from Last 3 Encounters:  04/07/17 75.3 kg (166 lb)  02/23/17 70.3 kg (155 lb)  12/14/13 70.7 kg (155 lb 13.8 oz)     Intake/Output Summary (Last 24 hours) at 04/09/17 0729 Last data filed at 04/08/17 2200  Gross per 24 hour  Intake               50 ml  Output                0 ml  Net               50 ml     Physical Exam  Awake Alert, Oriented X 3, No new F.N deficits, Normal affect [place].AT,PERRAL Supple  Neck,No JVD, No cervical lymphadenopathy appriciated.  Symmetrical Chest wall movement, Good air movement bilaterally, CTAB RRR,No Gallops,Rubs or new Murmurs, No Parasternal Heave +ve B.Sounds, Abd Soft, No tenderness, No organomegaly appriciated, No rebound - guarding or rigidity. No Cyanosis, Clubbing or edema, No new Rash or bruise      Data Review:    CBC  Recent Labs Lab 04/06/17 1426 04/06/17 1442 04/06/17 1733 04/07/17 0243 04/08/17 1044  WBC 7.6  --  7.0 7.0 8.0  HGB 13.0 13.3 12.6 12.1 13.5  HCT 38.9 39.0 37.6 36.2 40.6  PLT 264  --  261 257 287  MCV 87.4  --  87.0 87.0 87.1  MCH 29.2  --  29.2 29.1 29.0  MCHC 33.4  --  33.5 33.4 33.3  RDW 13.8  --  13.7 13.8 14.1  LYMPHSABS 1.5  --   --  2.2  --   MONOABS 0.6  --   --  0.7  --   EOSABS 0.4  --   --  0.7  --   BASOSABS 0.1  --   --  0.1  --     Chemistries   Recent Labs Lab 04/06/17 1426 04/06/17 1442 04/06/17 1733 04/07/17 0243 04/08/17 1044 04/09/17 0420  NA 139 139  --  140 140  --   K 4.0 4.0  --  3.0* 3.8  --  CL 105 105  --  105 109  --   CO2 24  --   --  26 24  --   GLUCOSE 100* 100*  --  100* 121*  --   BUN 17 20  --  13 12  --   CREATININE 0.89 0.90 0.85 0.89 0.87  --   CALCIUM 9.6  --   --  9.1 9.3  --   MG  --   --  1.4*  --  1.6* 2.2  AST 16  --   --   --  23  --   ALT 12*  --   --   --  11*  --   ALKPHOS 47  --   --   --  46  --   BILITOT 0.4  --   --   --  0.4  --    ------------------------------------------------------------------------------------------------------------------  Recent Labs  04/07/17 0243  CHOL 172  HDL 41  LDLCALC 101*  TRIG 148  CHOLHDL 4.2    Lab Results  Component Value Date   HGBA1C 5.7 (H) 04/07/2017   ------------------------------------------------------------------------------------------------------------------  Recent Labs  04/08/17 1044  TSH 2.101    ------------------------------------------------------------------------------------------------------------------ No results for input(s): VITAMINB12, FOLATE, FERRITIN, TIBC, IRON, RETICCTPCT in the last 72 hours.  Coagulation profile  Recent Labs Lab 04/06/17 1426  INR 1.04     Recent Labs  04/08/17 1044  DDIMER 0.31    Cardiac Enzymes  Recent Labs Lab 04/08/17 1637 04/08/17 2235 04/09/17 0420  CKMB  --  2.1  --   TROPONINI 0.25* 0.24* 0.21*   ------------------------------------------------------------------------------------------------------------------ No results found for: BNP  Inpatient Medications  Scheduled Meds: . aspirin  324 mg Oral Daily  . carvedilol  12.5 mg Oral BID WC  . enoxaparin (LOVENOX) injection  75 mg Subcutaneous Q12H  . loratadine  10 mg Oral Daily  . losartan  100 mg Oral Daily  . pantoprazole  40 mg Oral BID  . rosuvastatin  2.5 mg Oral QPM   Continuous Infusions: PRN Meds:.acetaminophen **OR** acetaminophen (TYLENOL) oral liquid 160 mg/5 mL **OR** acetaminophen, hydrALAZINE, ondansetron (ZOFRAN) IV, senna-docusate, zolpidem  Micro Results No results found for this or any previous visit (from the past 240 hour(s)).  Radiology Reports Ct Head Wo Contrast  Result Date: 04/06/2017 CLINICAL DATA:  Stroke symptoms greater than 8 hours, LEFT side pain and weakness, LEFT hand weakness, high blood pressure and headaches for 1 week, history hypertension, hypercholesterolemia EXAM: CT HEAD WITHOUT CONTRAST CT CERVICAL SPINE WITHOUT CONTRAST TECHNIQUE: Multidetector CT imaging of the head and cervical spine was performed following the standard protocol without intravenous contrast. Multiplanar CT image reconstructions of the cervical spine were also generated. COMPARISON:  None FINDINGS: CT HEAD FINDINGS Brain: Generalized atrophy. Normal ventricular morphology. No midline shift or mass effect. Small vessel chronic ischemic changes of deep  cerebral white matter. No intracranial hemorrhage, mass lesion, evidence of acute infarction, or extra-axial fluid collection. Vascular: Atherosclerotic calcification of internal carotid arteries at skullbase. Skull: Intact Sinuses/Orbits: Clear Other: N/A CT CERVICAL SPINE FINDINGS Alignment: Normal Skull base and vertebrae: Visualized skullbase intact. Osseous mineralization mildly decreased diffusely. Scattered facet degenerative changes. Vertebral body heights maintained without fracture or bone destruction. Soft tissues and spinal canal: Prevertebral soft tissues normal thickness. Soft tissues otherwise unremarkable. Atherosclerotic calcifications noted the carotid bifurcations. Disc levels: Disc space narrowing with endplate spur formation at C5-C6 and C6-C7, less at C4-C5. 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 Impingement upon BILATERAL  cervical neural foramina at C5-C6 and C6-C7 greater on RIGHT. Upper chest: Lung apices clear. Other: N/A IMPRESSION: Atrophy with small vessel chronic ischemic changes of deep cerebral white matter. No acute intracranial abnormalities. 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 No acute cervical spine abnormalities. Carotid vascular calcifications. Electronically Signed   By: Lavonia Dana M.D.   On: 04/06/2017 15:53   Ct Cervical Spine Wo Contrast  Result Date: 04/06/2017 CLINICAL DATA:  Stroke symptoms greater than 8 hours, LEFT side pain and weakness, LEFT hand weakness, high blood pressure and headaches for 1 week, history hypertension, hypercholesterolemia EXAM: CT HEAD WITHOUT CONTRAST CT CERVICAL SPINE WITHOUT CONTRAST TECHNIQUE: Multidetector CT imaging of the head and cervical spine was performed following the standard protocol without intravenous contrast. Multiplanar CT image reconstructions of the cervical spine were also generated. COMPARISON:  None FINDINGS: CT HEAD FINDINGS Brain: Generalized atrophy. Normal  ventricular morphology. No midline shift or mass effect. Small vessel chronic ischemic changes of deep cerebral white matter. No intracranial hemorrhage, mass lesion, evidence of acute infarction, or extra-axial fluid collection. Vascular: Atherosclerotic calcification of internal carotid arteries at skullbase. Skull: Intact Sinuses/Orbits: Clear Other: N/A CT CERVICAL SPINE FINDINGS Alignment: Normal Skull base and vertebrae: Visualized skullbase intact. Osseous mineralization mildly decreased diffusely. Scattered facet degenerative changes. Vertebral body heights maintained without fracture or bone destruction. Soft tissues and spinal canal: Prevertebral soft tissues normal thickness. Soft tissues otherwise unremarkable. Atherosclerotic calcifications noted the carotid bifurcations. Disc levels: Disc space narrowing with endplate spur formation at C5-C6 and C6-C7, less at C4-C5. 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 Impingement upon BILATERAL cervical neural foramina at C5-C6 and C6-C7 greater on RIGHT. Upper chest: Lung apices clear. Other: N/A IMPRESSION: Atrophy with small vessel chronic ischemic changes of deep cerebral white matter. No acute intracranial abnormalities. 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 No acute cervical spine abnormalities. Carotid vascular calcifications. Electronically Signed   By: Lavonia Dana M.D.   On: 04/06/2017 15:53   Mr Brain Wo Contrast  Result Date: 04/06/2017 CLINICAL DATA:  76 y/o  F; numbness and tingling of the right arm. EXAM: MRI HEAD WITHOUT CONTRAST MRA HEAD WITHOUT CONTRAST TECHNIQUE: Multiplanar, multiecho pulse sequences of the brain and surrounding structures were obtained without intravenous contrast. Angiographic images of the head were obtained using MRA technique without contrast. COMPARISON:  None. FINDINGS: MRI HEAD FINDINGS Brain: No acute infarction, hemorrhage, hydrocephalus, extra-axial collection  or mass lesion. Foci of T2 FLAIR hyperintense signal abnormality in subcortical and periventricular white matter is compatible with moderate chronic microvascular ischemic changes. There are a few chronic periventricular lacunar infarcts in the frontal and bilateral parietal lobes, left lentiform nucleus, and right cerebellar hemisphere. Mild brain parenchymal volume loss. Vascular: As below. Skull and upper cervical spine: Normal marrow signal. Sinuses/Orbits: Negative. Other: None. MRA HEAD FINDINGS Internal carotid arteries: Patent. Mild non stenotic irregularity of bilateral carotid and paraclinoid segments is compatible with atherosclerotic changes. Anterior cerebral arteries:  Patent. Middle cerebral arteries: Patent. Anterior communicating artery: Patent. Posterior communicating arteries:  Patent. Posterior cerebral arteries: Patent. Long segments of artifactual signal loss within the P2 segments. Basilar artery:  Patent. Vertebral arteries:  Patent. No evidence of high-grade stenosis, large vessel occlusion, or aneurysm unless noted above. IMPRESSION: 1. No acute intracranial abnormality identified. 2. Moderate for age chronic microvascular ischemic changes and mild parenchymal volume loss of the brain. 3. Patent circle of Willis without evidence for high-grade stenosis, large vessel  occlusion, or aneurysm. Electronically Signed   By: Kristine Garbe M.D.   On: 04/06/2017 21:34   Ct Abdomen Pelvis W Contrast  Result Date: 04/08/2017 CLINICAL DATA:  76 year old female with left upper quadrant abdominal pain and nausea. EXAM: CT ABDOMEN AND PELVIS WITH CONTRAST TECHNIQUE: Multidetector CT imaging of the abdomen and pelvis was performed using the standard protocol following bolus administration of intravenous contrast. CONTRAST:  117m ISOVUE-300 IOPAMIDOL (ISOVUE-300) INJECTION 61% COMPARISON:  CT Abdomen and Pelvis 01/12/2016. Abdominal radiographs 02/24/2016. FINDINGS: Lower chest: Lower lung  volumes compared to 2017. No pericardial or pleural effusion. Minor dependent right lower lobe atelectasis. Hepatobiliary: Surgically absent gallbladder. Stable liver and biliary ducts. Pancreas: Negative. Spleen: Stable and negative. Adrenals/Urinary Tract: Normal adrenal glands. Bilateral renal enhancement an contrast excretion is normal. Diminutive and unremarkable urinary bladder. Stomach/Bowel: Chronic sigmoid diverticulosis. Resolved sigmoid inflammation demonstrated in 2017. Negative rectum. Negative left colon. Oral contrast has reached the splenic flexure which appears normal. Redundant but otherwise negative transverse colon. Negative hepatic flexure. Negative right colon and appendix. The cecum is located in the anterior right hemipelvis, appendix is on series 21, image 68. Negative terminal ileum. No dilated small bowel. Appearance of new abnormal gastric wall thickening in the fundus and proximal body (series 21, image 16), circumferential but nonspecific. The proximal stomach is significantly less distended than on the prior study. The distal stomach and antrum appear normal. Negative duodenum. No perigastric inflammation. Negative visualized distal esophagus aside from mild gaseous distension. No abdominal free fluid or free air. Vascular/Lymphatic: Aortoiliac calcified atherosclerosis. Mild ectasia of the infrarenal abdominal aorta measuring up to 25 mm diameter. Major arterial structures in the abdomen and pelvis are patent. Portal venous system is patent. Reproductive: Surgically absent uterus. Normal ovaries along each pelvic sidewall. Other: No pelvic free fluid. Musculoskeletal: Mild scoliosis. Degenerative changes in the spine. No acute osseous abnormality identified. IMPRESSION: 1. Circumferential wall thickening of the proximal stomach, new since 2017 and beyond that expected for the lesser degree of gastric distention today. The appearance is nonspecific, with no discrete soft tissue mass or  perigastric inflammation. Favor Gastritis. Gastric neoplasm not excluded and recommend follow-up EGD to further characterize. 2. No other acute findings in the abdomen or pelvis. 3. 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 Recommend followup by Ultrasound in 5 years. This recommendation follows ACR consensus guidelines: White Paper of the ACR Incidental Findings Committee II on Vascular Findings. J Am Coll Radiol 2013; 10:789-794. 4. Sigmoid diverticulosis. Electronically Signed   By: HGenevie AnnM.D.   On: 04/08/2017 18:38   Mr MJodene NamHead/brain WHQCm  Result Date: 04/06/2017 CLINICAL DATA:  76y/o  F; numbness and tingling of the right arm. EXAM: MRI HEAD WITHOUT CONTRAST MRA HEAD WITHOUT CONTRAST TECHNIQUE: Multiplanar, multiecho pulse sequences of the brain and surrounding structures were obtained without intravenous contrast. Angiographic images of the head were obtained using MRA technique without contrast. COMPARISON:  None. FINDINGS: MRI HEAD FINDINGS Brain: No acute infarction, hemorrhage, hydrocephalus, extra-axial collection or mass lesion. Foci of T2 FLAIR hyperintense signal abnormality in subcortical and periventricular white matter is compatible with moderate chronic microvascular ischemic changes. There are a few chronic periventricular lacunar infarcts in the frontal and bilateral parietal lobes, left lentiform nucleus, and right cerebellar hemisphere. Mild brain parenchymal volume loss. Vascular: As below. Skull and upper cervical spine: Normal marrow signal. Sinuses/Orbits: Negative. Other: None. MRA HEAD FINDINGS Internal carotid arteries: Patent. Mild non stenotic irregularity of  bilateral carotid and paraclinoid segments is compatible with atherosclerotic changes. Anterior cerebral arteries:  Patent. Middle cerebral arteries: Patent. Anterior communicating artery: Patent. Posterior communicating arteries:  Patent. Posterior cerebral arteries:  Patent. Long segments of artifactual signal loss within the P2 segments. Basilar artery:  Patent. Vertebral arteries:  Patent. No evidence of high-grade stenosis, large vessel occlusion, or aneurysm unless noted above. IMPRESSION: 1. No acute intracranial abnormality identified. 2. Moderate for age chronic microvascular ischemic changes and mild parenchymal volume loss of the brain. 3. Patent circle of Willis without evidence for high-grade stenosis, large vessel occlusion, or aneurysm. Electronically Signed   By: Kristine Garbe M.D.   On: 04/06/2017 21:34    Time Spent in minutes  30   Jani Gravel M.D on 04/09/2017 at 7:29 AM  Between 7am to 7pm - Pager - [phone]  After 7pm go to [email or web address] - password [facility]  [facility] -  Office  [phone]

## 2017-04-09 NOTE — Consult Note (Signed)
Cardiology Consult    Patient ID: BRYONY KAMAN MRN: [id number], DOB/AGE: [birth year]   Admit date: 04/06/2017 Date of Consult: 04/09/2017  Primary Physician: Horatio Pel, MD Primary Cardiologist: new - Dr. Stanford Breed Requesting Provider: Dr. Maudie Mercury  Reason for Consult: elevated troponin  Patient Profile    Ms. Tejera isa  76 yo female with a PMH significant for HTN, HLD, and GERD presented to [facility] with stroke-like symptoms. Troponin x 3 were found to be mildly elevated.  FRANCHESKA VILLEDA is a 76 y.o. female who is being seen today for the evaluation of elevated troponin at the request of Dr. Maudie Mercury.   Past Medical History   Past Medical History:  Diagnosis Date  . GERD (gastroesophageal reflux disease)   . Hypercholesteremia   . Hypertension   . Seasonal allergies     Past Surgical History:  Procedure Laterality Date  . BACK SURGERY       Allergies  No Known Allergies  History of Present Illness    Ms. Rathod was admitted from [facility] with stroke-like symptoms on 4/15/1. She had left hand/fingers numbness and tingling. This sensation lasted several hours. She saw her PCP, who noted elevated BP and sent her to the ED for stroke workup. CT head was negative for acute findings. Echocardiogram with grade 1 DD.  She was recently seen in [facility] ED for elevated blood pressure.  On my interview, she denies chest pain, SOB, DOE, palpitations, LE edema (except after standing all day), and N/V. She has nonspecific left-sided abdominal pain, workup per primary team. She has been hypertensive this hospitalization.    Inpatient Medications    . aspirin  324 mg Oral Daily  . carvedilol  12.5 mg Oral BID WC  . enoxaparin (LOVENOX) injection  75 mg Subcutaneous Q12H  . loratadine  10 mg Oral Daily  . losartan  100 mg Oral Daily  . pantoprazole  40 mg Oral BID  . rosuvastatin  10 mg Oral QPM     Outpatient Medications    Prior to Admission medications   Medication Sig Start  Date End Date Taking? Authorizing Provider  acetaminophen (TYLENOL) 500 MG tablet Take 500 mg by mouth every 6 (six) hours as needed (for pain).    Yes Historical Provider, MD  aspirin 81 MG tablet Take 81 mg by mouth every morning.    Yes Historical Provider, MD  cetirizine (ZYRTEC) 10 MG tablet Take 10 mg by mouth at bedtime.   Yes Historical Provider, MD  cholecalciferol (VITAMIN D) 1000 UNITS tablet Take 2,000 Units by mouth every morning.   Yes Historical Provider, MD  hydrochlorothiazide (HYDRODIURIL) 25 MG tablet Take 25 mg by mouth every morning. 03/12/17  Yes Historical Provider, MD  losartan (COZAAR) 100 MG tablet Take 100 mg by mouth every morning.   Yes Historical Provider, MD  omeprazole (PRILOSEC) 20 MG capsule Take 20 mg by mouth every morning.    Yes Historical Provider, MD  rosuvastatin (CRESTOR) 5 MG tablet Take 2.5 mg by mouth every evening.    Yes Historical Provider, MD     Family History     Family History  Problem Relation Age of Onset  . Colon cancer Mother   . Lung cancer Father   . Crohn's disease Brother     Social History    Social History   Social History  . Marital status: Married    Spouse name: N/A  . Number of children: N/A  . Years of education:  N/A   Occupational History  . Not on file.   Social History Main Topics  . Smoking status: Never Smoker  . Smokeless tobacco: Never Used  . Alcohol use No  . Drug use: No  . Sexual activity: Not Currently   Other Topics Concern  . Not on file   Social History Narrative  . No narrative on file     Review of Systems    General:  No chills, fever, night sweats or weight changes.  Cardiovascular:  No chest pain, dyspnea on exertion, edema, orthopnea, palpitations, paroxysmal nocturnal dyspnea. Dermatological: No rash, lesions/masses Respiratory: No cough, dyspnea Urologic: No hematuria, dysuria Abdominal:   No nausea, vomiting, diarrhea, bright red blood per rectum, melena, or  hematemesis Neurologic:  No visual changes, changes in mental status. Positive for left hand/finger tingling All other systems reviewed and are otherwise negative except as noted above.  Physical Exam    Blood pressure (!) 152/92, pulse 73, temperature 99.7 F (37.6 C), temperature source Oral, resp. rate 18, height 5\' 7"  (1.702 m), weight 166 lb (75.3 kg), SpO2 98 %.  General: Pleasant, NAD Psych: Normal affect. Neuro: Alert and oriented X 3. Moves all extremities spontaneously. HEENT: Normal  Neck: Supple without bruits or JVD. Lungs:  Resp regular and unlabored, CTA. Heart: RRR no s3, s4, or murmurs. Abdomen: Soft, non-tender, non-distended, BS + x 4.  Extremities: No clubbing, cyanosis or edema. DP/PT/Radials 1+ and equal bilaterally.  Labs    Troponin [facility] Test)  Recent Labs  04/06/17 1439  TROPIPOC 0.00    Recent Labs  04/08/17 1044 04/08/17 1637 04/08/17 2235 04/09/17 0420  CKTOTAL  --   --  72  --   CKMB  --   --  2.1  --   TROPONINI 0.06* 0.25* 0.24* 0.21*   Lab Results  Component Value Date   WBC 8.0 04/08/2017   HGB 13.5 04/08/2017   HCT 40.6 04/08/2017   MCV 87.1 04/08/2017   PLT 287 04/08/2017    Recent Labs Lab 04/08/17 1044  NA 140  K 3.8  CL 109  CO2 24  BUN 12  CREATININE 0.87  CALCIUM 9.3  PROT 6.5  BILITOT 0.4  ALKPHOS 46  ALT 11*  AST 23  GLUCOSE 121*   Lab Results  Component Value Date   CHOL 172 04/07/2017   HDL 41 04/07/2017   LDLCALC 101 (H) 04/07/2017   TRIG 148 04/07/2017   Lab Results  Component Value Date   DDIMER 0.31 04/08/2017     Radiology Studies    Ct Head Wo Contrast  Result Date: 04/06/2017 CLINICAL DATA:  Stroke symptoms greater than 8 hours, LEFT side pain and weakness, LEFT hand weakness, high blood pressure and headaches for 1 week, history hypertension, hypercholesterolemia EXAM: CT HEAD WITHOUT CONTRAST CT CERVICAL SPINE WITHOUT CONTRAST TECHNIQUE: Multidetector CT imaging of the head  and cervical spine was performed following the standard protocol without intravenous contrast. Multiplanar CT image reconstructions of the cervical spine were also generated. COMPARISON:  None FINDINGS: CT HEAD FINDINGS Brain: Generalized atrophy. Normal ventricular morphology. No midline shift or mass effect. Small vessel chronic ischemic changes of deep cerebral white matter. No intracranial hemorrhage, mass lesion, evidence of acute infarction, or extra-axial fluid collection. Vascular: Atherosclerotic calcification of internal carotid arteries at skullbase. Skull: Intact Sinuses/Orbits: Clear Other: N/A CT CERVICAL SPINE FINDINGS Alignment: Normal Skull base and vertebrae: Visualized skullbase intact. Osseous mineralization mildly decreased diffusely. Scattered facet degenerative changes.  Vertebral body heights maintained without fracture or bone destruction. Soft tissues and spinal canal: Prevertebral soft tissues normal thickness. Soft tissues otherwise unremarkable. Atherosclerotic calcifications noted the carotid bifurcations. Disc levels: Disc space narrowing with endplate spur formation at C5-C6 and C6-C7, less at C4-C5. 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 Impingement upon BILATERAL cervical neural foramina at C5-C6 and C6-C7 greater on RIGHT. Upper chest: Lung apices clear. Other: N/A IMPRESSION: Atrophy with small vessel chronic ischemic changes of deep cerebral white matter. No acute intracranial abnormalities. 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 No acute cervical spine abnormalities. Carotid vascular calcifications. Electronically Signed   By: Lavonia Dana M.D.   On: 04/06/2017 15:53   Ct Cervical Spine Wo Contrast  Result Date: 04/06/2017 CLINICAL DATA:  Stroke symptoms greater than 8 hours, LEFT side pain and weakness, LEFT hand weakness, high blood pressure and headaches for 1 week, history hypertension, hypercholesterolemia EXAM: CT  HEAD WITHOUT CONTRAST CT CERVICAL SPINE WITHOUT CONTRAST TECHNIQUE: Multidetector CT imaging of the head and cervical spine was performed following the standard protocol without intravenous contrast. Multiplanar CT image reconstructions of the cervical spine were also generated. COMPARISON:  None FINDINGS: CT HEAD FINDINGS Brain: Generalized atrophy. Normal ventricular morphology. No midline shift or mass effect. Small vessel chronic ischemic changes of deep cerebral white matter. No intracranial hemorrhage, mass lesion, evidence of acute infarction, or extra-axial fluid collection. Vascular: Atherosclerotic calcification of internal carotid arteries at skullbase. Skull: Intact Sinuses/Orbits: Clear Other: N/A CT CERVICAL SPINE FINDINGS Alignment: Normal Skull base and vertebrae: Visualized skullbase intact. Osseous mineralization mildly decreased diffusely. Scattered facet degenerative changes. Vertebral body heights maintained without fracture or bone destruction. Soft tissues and spinal canal: Prevertebral soft tissues normal thickness. Soft tissues otherwise unremarkable. Atherosclerotic calcifications noted the carotid bifurcations. Disc levels: Disc space narrowing with endplate spur formation at C5-C6 and C6-C7, less at C4-C5. 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 Impingement upon BILATERAL cervical neural foramina at C5-C6 and C6-C7 greater on RIGHT. Upper chest: Lung apices clear. Other: N/A IMPRESSION: Atrophy with small vessel chronic ischemic changes of deep cerebral white matter. No acute intracranial abnormalities. 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 No acute cervical spine abnormalities. Carotid vascular calcifications. Electronically Signed   By: Lavonia Dana M.D.   On: 04/06/2017 15:53   Mr Brain Wo Contrast  Result Date: 04/06/2017 CLINICAL DATA:  76 y/o  F; numbness and tingling of the right arm. EXAM: MRI HEAD WITHOUT CONTRAST MRA HEAD  WITHOUT CONTRAST TECHNIQUE: Multiplanar, multiecho pulse sequences of the brain and surrounding structures were obtained without intravenous contrast. Angiographic images of the head were obtained using MRA technique without contrast. COMPARISON:  None. FINDINGS: MRI HEAD FINDINGS Brain: No acute infarction, hemorrhage, hydrocephalus, extra-axial collection or mass lesion. Foci of T2 FLAIR hyperintense signal abnormality in subcortical and periventricular white matter is compatible with moderate chronic microvascular ischemic changes. There are a few chronic periventricular lacunar infarcts in the frontal and bilateral parietal lobes, left lentiform nucleus, and right cerebellar hemisphere. Mild brain parenchymal volume loss. Vascular: As below. Skull and upper cervical spine: Normal marrow signal. Sinuses/Orbits: Negative. Other: None. MRA HEAD FINDINGS Internal carotid arteries: Patent. Mild non stenotic irregularity of bilateral carotid and paraclinoid segments is compatible with atherosclerotic changes. Anterior cerebral arteries:  Patent. Middle cerebral arteries: Patent. Anterior communicating artery: Patent. Posterior communicating arteries:  Patent. Posterior cerebral arteries: Patent. Long segments of artifactual signal loss within the P2 segments.  Basilar artery:  Patent. Vertebral arteries:  Patent. No evidence of high-grade stenosis, large vessel occlusion, or aneurysm unless noted above. IMPRESSION: 1. No acute intracranial abnormality identified. 2. Moderate for age chronic microvascular ischemic changes and mild parenchymal volume loss of the brain. 3. Patent circle of Willis without evidence for high-grade stenosis, large vessel occlusion, or aneurysm. Electronically Signed   By: Kristine Garbe M.D.   On: 04/06/2017 21:34   Ct Abdomen Pelvis W Contrast  Result Date: 04/08/2017 CLINICAL DATA:  76 year old female with left upper quadrant abdominal pain and nausea. EXAM: CT ABDOMEN AND  PELVIS WITH CONTRAST TECHNIQUE: Multidetector CT imaging of the abdomen and pelvis was performed using the standard protocol following bolus administration of intravenous contrast. CONTRAST:  151mL ISOVUE-300 IOPAMIDOL (ISOVUE-300) INJECTION 61% COMPARISON:  CT Abdomen and Pelvis 01/12/2016. Abdominal radiographs 02/24/2016. FINDINGS: Lower chest: Lower lung volumes compared to 2017. No pericardial or pleural effusion. Minor dependent right lower lobe atelectasis. Hepatobiliary: Surgically absent gallbladder. Stable liver and biliary ducts. Pancreas: Negative. Spleen: Stable and negative. Adrenals/Urinary Tract: Normal adrenal glands. Bilateral renal enhancement an contrast excretion is normal. Diminutive and unremarkable urinary bladder. Stomach/Bowel: Chronic sigmoid diverticulosis. Resolved sigmoid inflammation demonstrated in 2017. Negative rectum. Negative left colon. Oral contrast has reached the splenic flexure which appears normal. Redundant but otherwise negative transverse colon. Negative hepatic flexure. Negative right colon and appendix. The cecum is located in the anterior right hemipelvis, appendix is on series 21, image 68. Negative terminal ileum. No dilated small bowel. Appearance of new abnormal gastric wall thickening in the fundus and proximal body (series 21, image 16), circumferential but nonspecific. The proximal stomach is significantly less distended than on the prior study. The distal stomach and antrum appear normal. Negative duodenum. No perigastric inflammation. Negative visualized distal esophagus aside from mild gaseous distension. No abdominal free fluid or free air. Vascular/Lymphatic: Aortoiliac calcified atherosclerosis. Mild ectasia of the infrarenal abdominal aorta measuring up to 25 mm diameter. Major arterial structures in the abdomen and pelvis are patent. Portal venous system is patent. Reproductive: Surgically absent uterus. Normal ovaries along each pelvic sidewall. Other:  No pelvic free fluid. Musculoskeletal: Mild scoliosis. Degenerative changes in the spine. No acute osseous abnormality identified. IMPRESSION: 1. Circumferential wall thickening of the proximal stomach, new since 2017 and beyond that expected for the lesser degree of gastric distention today. The appearance is nonspecific, with no discrete soft tissue mass or perigastric inflammation. Favor Gastritis. Gastric neoplasm not excluded and recommend follow-up EGD to further characterize. 2. No other acute findings in the abdomen or pelvis. 3. 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 Recommend followup by Ultrasound in 5 years. This recommendation follows ACR consensus guidelines: White Paper of the ACR Incidental Findings Committee II on Vascular Findings. J Am Coll Radiol 2013; 10:789-794. 4. Sigmoid diverticulosis. Electronically Signed   By: Genevie Ann M.D.   On: 04/08/2017 18:38   Mr Jodene Nam Head/brain TD Cm  Result Date: 04/06/2017 CLINICAL DATA:  76 y/o  F; numbness and tingling of the right arm. EXAM: MRI HEAD WITHOUT CONTRAST MRA HEAD WITHOUT CONTRAST TECHNIQUE: Multiplanar, multiecho pulse sequences of the brain and surrounding structures were obtained without intravenous contrast. Angiographic images of the head were obtained using MRA technique without contrast. COMPARISON:  None. FINDINGS: MRI HEAD FINDINGS Brain: No acute infarction, hemorrhage, hydrocephalus, extra-axial collection or mass lesion. Foci of T2 FLAIR hyperintense signal abnormality in subcortical and periventricular white matter is compatible with moderate chronic microvascular  ischemic changes. There are a few chronic periventricular lacunar infarcts in the frontal and bilateral parietal lobes, left lentiform nucleus, and right cerebellar hemisphere. Mild brain parenchymal volume loss. Vascular: As below. Skull and upper cervical spine: Normal marrow signal. Sinuses/Orbits: Negative. Other:  None. MRA HEAD FINDINGS Internal carotid arteries: Patent. Mild non stenotic irregularity of bilateral carotid and paraclinoid segments is compatible with atherosclerotic changes. Anterior cerebral arteries:  Patent. Middle cerebral arteries: Patent. Anterior communicating artery: Patent. Posterior communicating arteries:  Patent. Posterior cerebral arteries: Patent. Long segments of artifactual signal loss within the P2 segments. Basilar artery:  Patent. Vertebral arteries:  Patent. No evidence of high-grade stenosis, large vessel occlusion, or aneurysm unless noted above. IMPRESSION: 1. No acute intracranial abnormality identified. 2. Moderate for age chronic microvascular ischemic changes and mild parenchymal volume loss of the brain. 3. Patent circle of Willis without evidence for high-grade stenosis, large vessel occlusion, or aneurysm. Electronically Signed   By: Kristine Garbe M.D.   On: 04/06/2017 21:34    ECG & Cardiac Imaging    EKG 04/07/17: sinus rhythm  Echocardiogram 04/08/17: Study Conclusions - Left ventricle: The cavity size was normal. Wall thickness was   normal. Systolic function was vigorous. The estimated ejection   fraction was in the range of 65% to 70%. Wall motion was normal;   there were no regional wall motion abnormalities. Doppler   parameters are consistent with abnormal left ventricular   relaxation (grade 1 diastolic dysfunction).  Assessment & Plan    1. Elevated troponin - troponin: 0.06 --> 0.25 --> 0.24 --> 0.21 - EKG with without signs of ischemia Elevated troponin is likely not cardiac in nature given her normal EKG and lack of cardiac symptoms. However, pt would like to proceed with inpatient lexiscan myoview today. Continue NPO.     2. HTN / HLD - currently on coreg, losartan, and crestor - previously taking diovan, no on losartan/HCTZ - BP continues to be elevated, will add back home HCTZ and consider norvasc if BP continues to be  elevated.   3. GERD - per primary team   4. Stroke-like symptoms - included left arm numbness and tingling - head CT negative for acute stroke, MR brain without signs of ischemia - per primary team/neurology   Signed, Tami Lin Duke, PA-C 04/09/2017, 10:24 AM [id number]  As above, patient seen and examined. Briefly she is a 76 year old female with past medical history of hypertension, hyperlipidemia and gastroesophageal reflux disease admitted with question stroke symptoms who Dr. Maudie Mercury has asked me to evaluate for elevated troponin.  Patient developed left hand numbness and tingling on Friday the 13th. No other neurological symptoms. No dyspnea, nausea or chest discomfort. She was felt to potentially have stroke symptoms but during this admission MRI negative. Cardiology asked to evaluate for elevated troponin.  Laboratories show troponin 0.06, 0.25, 0.24 and 0.21. Electrocardiogram shows sinus rhythm with no ST changes. D-dimer negative. Echocardiogram shows vigorous LV systolic function and grade 1 diastolic dysfunction. Abdominal CT showed thickened stomach wall, ectatic aorta and aortic calcification.  1 elevated troponin-etiology unclear and does not appear to be consistent with acute coronary syndrome. No chest pain or electrocardiographic changes. We will plan a nuclear study for risk stratification. Patient requests that this be performed in hospital.   2 hypertension-blood pressure remains elevated. Add HCTZ and follow. If blood pressure remains elevated would add amlodipine. If blood pressure remains difficult to control would need workup for secondary causes of hypertension.  3 ectatic  aorta-would arrange abdominal ultrasound in 5 years for follow-up based on recommendations.   Kirk Ruths, MD

## 2017-04-10 DIAGNOSIS — R2990 Unspecified symptoms and signs involving the nervous system: Secondary | ICD-10-CM

## 2017-04-10 DIAGNOSIS — G459 Transient cerebral ischemic attack, unspecified: Principal | ICD-10-CM

## 2017-04-10 LAB — COMPREHENSIVE METABOLIC PANEL
ALT: 16 U/L (ref 14–54)
AST: 26 U/L (ref 15–41)
Albumin: 3.4 g/dL — ABNORMAL LOW (ref 3.5–5.0)
Alkaline Phosphatase: 43 U/L (ref 38–126)
Anion gap: 8 (ref 5–15)
BUN: 18 mg/dL (ref 6–20)
CHLORIDE: 104 mmol/L (ref 101–111)
CO2: 26 mmol/L (ref 22–32)
CREATININE: 1.02 mg/dL — AB (ref 0.44–1.00)
Calcium: 9.5 mg/dL (ref 8.9–10.3)
GFR calc non Af Amer: 52 mL/min — ABNORMAL LOW (ref >60)
Glucose, Bld: 113 mg/dL — ABNORMAL HIGH (ref 65–99)
Potassium: 4.7 mmol/L (ref 3.5–5.1)
SODIUM: 138 mmol/L (ref 135–145)
Total Bilirubin: 0.7 mg/dL (ref 0.3–1.2)
Total Protein: 6.4 g/dL — ABNORMAL LOW (ref 6.5–8.1)

## 2017-04-10 LAB — CBC
HCT: 39.6 % (ref 36.0–46.0)
Hemoglobin: 13.2 g/dL (ref 12.0–15.0)
MCH: 29.3 pg (ref 26.0–34.0)
MCHC: 33.3 g/dL (ref 30.0–36.0)
MCV: 88 fL (ref 78.0–100.0)
PLATELETS: 276 10*3/uL (ref 150–400)
RBC: 4.5 MIL/uL (ref 3.87–5.11)
RDW: 13.9 % (ref 11.5–15.5)
WBC: 6.6 10*3/uL (ref 4.0–10.5)

## 2017-04-10 MED ORDER — ASPIRIN EC 325 MG PO TBEC: 325.0000 mg | DELAYED_RELEASE_TABLET | Freq: Every day | ORAL | 0 refills | Status: DC

## 2017-04-10 MED ORDER — ROSUVASTATIN CALCIUM 10 MG PO TABS: 10.0000 mg | ORAL_TABLET | Freq: Every evening | ORAL | 0 refills | Status: AC

## 2017-04-10 MED ORDER — CARVEDILOL 12.5 MG PO TABS: 12.5000 mg | ORAL_TABLET | Freq: Two times a day (BID) | ORAL | 0 refills | Status: AC

## 2017-04-10 NOTE — Progress Notes (Signed)
Progress Note  Patient Name: Tina Ellis Date of Encounter: 04/10/2017  Primary Cardiologist: New  Subjective   Pt denies CP or dyspnea  Inpatient Medications    Scheduled Meds: . aspirin  324 mg Oral Daily  . carvedilol  12.5 mg Oral BID WC  . enoxaparin (LOVENOX) injection  75 mg Subcutaneous Q12H  . hydrochlorothiazide  25 mg Oral Daily  . loratadine  10 mg Oral Daily  . losartan  100 mg Oral Daily  . pantoprazole  40 mg Oral BID  . rosuvastatin  10 mg Oral QPM   Continuous Infusions:  PRN Meds: acetaminophen **OR** acetaminophen (TYLENOL) oral liquid 160 mg/5 mL **OR** acetaminophen, hydrALAZINE, ondansetron (ZOFRAN) IV, senna-docusate, zolpidem   Vital Signs    Vitals:   04/09/17 2059 04/10/17 0102 04/10/17 0602 04/10/17 0934  BP: 137/80 (!) 161/74 (!) 168/96 134/62  Pulse: 76 78 77 77  Resp: 20 20 20 20   Temp: 98.7 F (37.1 C) 98.7 F (37.1 C) 98.2 F (36.8 C) 98.1 F (36.7 C)  TempSrc: Oral Oral Oral Oral  SpO2: 99% 99% 97% 97%  Weight:      Height:        Intake/Output Summary (Last 24 hours) at 04/10/17 1008 Last data filed at 04/09/17 2100  Gross per 24 hour  Intake              120 ml  Output                0 ml  Net              120 ml   Filed Weights   04/07/17 2107  Weight: 166 lb (75.3 kg)    Telemetry    NSR - Personally Reviewed  Physical Exam   GEN: No acute distress.   Neck: No JVD Cardiac: RRR, no murmurs, rubs, or gallops.  Respiratory: Clear to auscultation bilaterally. GI: Soft, nontender, non-distended  MS: No edema Neuro:  Nonfocal  Psych: Normal affect   Labs    Chemistry Recent Labs Lab 04/06/17 1426  04/07/17 0243 04/08/17 1044 04/10/17 0538  NA 139  < > 140 140 138  K 4.0  < > 3.0* 3.8 4.7  CL 105  < > 105 109 104  CO2 24  --  26 24 26   GLUCOSE 100*  < > 100* 121* 113*  BUN 17  < > 13 12 18   CREATININE 0.89  < > 0.89 0.87 1.02*  CALCIUM 9.6  --  9.1 9.3 9.5  PROT 6.5  --   --  6.5 6.4*    ALBUMIN 3.8  --   --  3.5 3.4*  AST 16  --   --  23 26  ALT 12*  --   --  11* 16  ALKPHOS 47  --   --  46 43  BILITOT 0.4  --   --  0.4 0.7  GFRNONAA >60  < > >60 >60 52*  GFRAA >60  < > >60 >60 >60  ANIONGAP 10  --  9 7 8   < > = values in this interval not displayed.   Hematology Recent Labs Lab 04/07/17 0243 04/08/17 1044 04/10/17 0538  WBC 7.0 8.0 6.6  RBC 4.16 4.66 4.50  HGB 12.1 13.5 13.2  HCT 36.2 40.6 39.6  MCV 87.0 87.1 88.0  MCH 29.1 29.0 29.3  MCHC 33.4 33.3 33.3  RDW 13.8 14.1 13.9  PLT 257 287 276  Cardiac Enzymes Recent Labs Lab 04/08/17 1044 04/08/17 1637 04/08/17 2235 04/09/17 0420  TROPONINI 0.06* 0.25* 0.24* 0.21*    Recent Labs Lab 04/06/17 1439  TROPIPOC 0.00      DDimer  Recent Labs Lab 04/08/17 1044  DDIMER 0.31     Radiology    Ct Abdomen Pelvis W Contrast  Result Date: 04/08/2017 CLINICAL DATA:  76 year old female with left upper quadrant abdominal pain and nausea. EXAM: CT ABDOMEN AND PELVIS WITH CONTRAST TECHNIQUE: Multidetector CT imaging of the abdomen and pelvis was performed using the standard protocol following bolus administration of intravenous contrast. CONTRAST:  138mL ISOVUE-300 IOPAMIDOL (ISOVUE-300) INJECTION 61% COMPARISON:  CT Abdomen and Pelvis 01/12/2016. Abdominal radiographs 02/24/2016. FINDINGS: Lower chest: Lower lung volumes compared to 2017. No pericardial or pleural effusion. Minor dependent right lower lobe atelectasis. Hepatobiliary: Surgically absent gallbladder. Stable liver and biliary ducts. Pancreas: Negative. Spleen: Stable and negative. Adrenals/Urinary Tract: Normal adrenal glands. Bilateral renal enhancement an contrast excretion is normal. Diminutive and unremarkable urinary bladder. Stomach/Bowel: Chronic sigmoid diverticulosis. Resolved sigmoid inflammation demonstrated in 2017. Negative rectum. Negative left colon. Oral contrast has reached the splenic flexure which appears normal. Redundant but  otherwise negative transverse colon. Negative hepatic flexure. Negative right colon and appendix. The cecum is located in the anterior right hemipelvis, appendix is on series 21, image 68. Negative terminal ileum. No dilated small bowel. Appearance of new abnormal gastric wall thickening in the fundus and proximal body (series 21, image 16), circumferential but nonspecific. The proximal stomach is significantly less distended than on the prior study. The distal stomach and antrum appear normal. Negative duodenum. No perigastric inflammation. Negative visualized distal esophagus aside from mild gaseous distension. No abdominal free fluid or free air. Vascular/Lymphatic: Aortoiliac calcified atherosclerosis. Mild ectasia of the infrarenal abdominal aorta measuring up to 25 mm diameter. Major arterial structures in the abdomen and pelvis are patent. Portal venous system is patent. Reproductive: Surgically absent uterus. Normal ovaries along each pelvic sidewall. Other: No pelvic free fluid. Musculoskeletal: Mild scoliosis. Degenerative changes in the spine. No acute osseous abnormality identified. IMPRESSION: 1. Circumferential wall thickening of the proximal stomach, new since 2017 and beyond that expected for the lesser degree of gastric distention today. The appearance is nonspecific, with no discrete soft tissue mass or perigastric inflammation. Favor Gastritis. Gastric neoplasm not excluded and recommend follow-up EGD to further characterize. 2. No other acute findings in the abdomen or pelvis. 3. 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 Recommend followup by Ultrasound in 5 years. This recommendation follows ACR consensus guidelines: White Paper of the ACR Incidental Findings Committee II on Vascular Findings. J Am Coll Radiol 2013; 10:789-794. 4. Sigmoid diverticulosis. Electronically Signed   By: Genevie Ann M.D.   On: 04/08/2017 18:38   Nm Myocar Multi  W/spect W/wall Motion / Ef  Result Date: 04/09/2017  There was no ST segment deviation noted during stress.  The left ventricular ejection fraction is hyperdynamic (>65%).  Nuclear stress EF: 73%.  Overall poor quality study. Counts are very low throughout the myocardium on rest images and on stress images there is mild reduction in tracer uptake in the mid and basal inferior wall but worse on rest images. This likely is a normal study but difficult rule out ischemia in the basal and mid inferior wall as the rest images are so poor quality.  This is a low risk study.     Patient Profile     76 year old female  with past medical history of hypertension, hyperlipidemia and gastroesophageal reflux disease admitted with question stroke symptoms who Dr. Maudie Mercury has asked me to evaluate for elevated troponin.  Patient developed left hand numbness and tingling on Friday the 13th. No other neurological symptoms. No dyspnea, nausea or chest discomfort. She was felt to potentially have stroke symptoms but during this admission MRI negative. Cardiology asked to evaluate for elevated troponin.  Assessment & Plan    1 elevated troponin-etiology unclear and does not appear to be consistent with acute coronary syndrome. No chest pain or electrocardiographic changes. I have reviewed nuclear images; there is no ischemia. No further cardiac WU.   2 hypertension-continue present meds and follow. If blood pressure remains elevated would add amlodipine. If blood pressure remains difficult to control would need workup for secondary causes of hypertension.  3 ectatic aorta-would arrange abdominal ultrasound in 3-5 years for follow-up based on recommendations.  Cardiology will sign off; pt should fu with her primary care physician for above isses including thickened stomach wall. Please call with questions.  Signed, Kirk Ruths, MD  04/10/2017, 10:08 AM

## 2017-04-10 NOTE — Discharge Summary (Addendum)
Physician Discharge Summary  Tina Ellis ENI:[id number] DOB: 1941/03/02 DOA: 04/06/2017  PCP: Horatio Pel, MD  Admit date: 04/06/2017 Discharge date: 04/10/2017  Admitted From:Home Disposition:Home  Recommendations for Outpatient Follow-up:  1. Follow up with PCP and GI in 1-2 weeks 2. Please obtain BMP/CBC in one week  Home Health:no Equipment/Devices:no Discharge Condition:stable CODE STATUS:full Diet recommendation:heart healthy  Brief/Interim Summary: 76 year old female with history of hypertension, hyperlipidemia, acid reflux disease presented with numbness and tingling of the left arm distally. Patient also having elevated blood pressure and follows up with her primary care doctor. In the ER, as per prior note EDP contacted neurologist and patient was recommended to admission.  Patient had CT head, MRI of the brain and MRA of the brain with no acute finding. Carotid Doppler ultrasound consistent with mild atherosclerotic disease with no critical stenosis. Echocardiogram showed EF of 65-70% with normal wall motion. LDL 101, A1c 5.7. Patient's symptoms completely resolved. Able to ambulate without difficulties. Denied numbness or tingling  sensation. The dose of aspirin increased to 325 g and also increased the dose of crestor to 10 mg. Patient was evaluated by PT and OT recommended no follow-up. Patient is very functional and is still active with her occupation. Neurology was referred as outpatient.  She was found to have mild elevation in troponin. Initially treated with sq Lovenox for presumed ACS. Evaluated by cardiologist, underwent cardiac stress test which ruled out ACS. Patient is on aspirin, statin and Coreg.  She was also found to have fluctuation in blood pressure therefore medications adjusted. She is tolerating well. Blood pressure is better controlled.  CT scan of abdomen and pelvis was done as a part of left upper quadrant pain which was consistent with  circumferential thickening of proximal stoma, gastritis favored but cannot exclude a gastric neoplasm. It was reviewed with the patient at bedside and recommended to her to follow-up with GI. Patient reported that she had GI with the [facility] and she will follow-up with them for further evaluation including possible endoscopy. Patient denied nausea vomiting and able to tolerate diet well. She is on Protonix.  Patient is clinically stable. Medically stable to discharge with outpatient follow-up. Patient verbalized understanding.  Discharge Diagnoses:  Principal Problem:   Stroke-like symptom Active Problems:   HTN (hypertension)   GERD (gastroesophageal reflux disease)   TIA (transient ischemic attack)   Abdominal pain   Elevated troponin Hypokalemia Hypomagnesemia   Discharge Instructions  Discharge Instructions    Ambulatory referral to Neurology    Complete by:  As directed    An appointment is requested in approximately: 4 weeks   Call MD for:  difficulty breathing, headache or visual disturbances    Complete by:  As directed    Call MD for:  extreme fatigue    Complete by:  As directed    Call MD for:  hives    Complete by:  As directed    Call MD for:  persistant dizziness or light-headedness    Complete by:  As directed    Call MD for:  persistant nausea and vomiting    Complete by:  As directed    Call MD for:  severe uncontrolled pain    Complete by:  As directed    Call MD for:  temperature >100.4    Complete by:  As directed    Diet - low sodium heart healthy    Complete by:  As directed    Discharge instructions    Complete  by:  As directed    Please follow up with your PCP, GI in 1-2 weeks. You may need GI endoscopy, please discuss with your GI. ectatic aorta-may need abdominal ultrasound in 3-5 years, follow up with your pcp   Increase activity slowly    Complete by:  As directed      Allergies as of 04/10/2017   No Known Allergies     Medication List     STOP taking these medications   aspirin 81 MG tablet Replaced by:  aspirin EC 325 MG tablet     TAKE these medications   acetaminophen 500 MG tablet Commonly known as:  TYLENOL Take 500 mg by mouth every 6 (six) hours as needed (for pain).   aspirin EC 325 MG tablet Take 1 tablet (325 mg total) by mouth daily. Replaces:  aspirin 81 MG tablet   carvedilol 12.5 MG tablet Commonly known as:  COREG Take 1 tablet (12.5 mg total) by mouth 2 (two) times daily with a meal.   cetirizine 10 MG tablet Commonly known as:  ZYRTEC Take 10 mg by mouth at bedtime.   cholecalciferol 1000 units tablet Commonly known as:  VITAMIN D Take 2,000 Units by mouth every morning.   hydrochlorothiazide 25 MG tablet Commonly known as:  HYDRODIURIL Take 25 mg by mouth every morning.   losartan 100 MG tablet Commonly known as:  COZAAR Take 100 mg by mouth every morning.   omeprazole 20 MG capsule Commonly known as:  PRILOSEC Take 20 mg by mouth every morning.   rosuvastatin 10 MG tablet Commonly known as:  CRESTOR Take 1 tablet (10 mg total) by mouth every evening. What changed:  medication strength  how much to take      Follow-up Information    Horatio Pel, MD. Schedule an appointment as soon as possible for a visit in 1 week(s).   Specialty:  Internal Medicine Contact information: [address] [place] [address] [phone]          No Known Allergies  Consultations: Cardiology  Procedures/Studies:   Subjective: Patient was seen and examined at bedside. Reported doing good. Denied headache, dizziness, nausea, vomiting, chest pain, shortness of breath, numbness or tingling sensation in her extremities. Clinically improved. Verbalized follow-up instructions.  Discharge Exam: Vitals:   04/10/17 0602 04/10/17 0934  BP: (!) 168/96 134/62  Pulse: 77 77  Resp: 20 20  Temp: 98.2 F (36.8 C) 98.1 F (36.7 C)   Vitals:   04/09/17 2059  04/10/17 0102 04/10/17 0602 04/10/17 0934  BP: 137/80 (!) 161/74 (!) 168/96 134/62  Pulse: 76 78 77 77  Resp: 20 20 20 20   Temp: 98.7 F (37.1 C) 98.7 F (37.1 C) 98.2 F (36.8 C) 98.1 F (36.7 C)  TempSrc: Oral Oral Oral Oral  SpO2: 99% 99% 97% 97%  Weight:      Height:        General: Pt is alert, awake, not in acute distress Cardiovascular: RRR, S1/S2 +, no rubs, no gallops Respiratory: CTA bilaterally, no wheezing, no rhonchi Abdominal: Soft, NT, ND, bowel sounds + Extremities: no edema, no cyanosis Neurology: Oriented 3, muscle strength 5 over 5 in all extremities, sensation intact,   The results of significant diagnostics from this hospitalization (including imaging, microbiology, ancillary and laboratory) are listed below for reference.     Microbiology: No results found for this or any previous visit (from the past 240 hour(s)).   Labs: BNP (last 3 results) No  results for input(s): BNP in the last 8760 hours. Basic Metabolic Panel:  Recent Labs Lab 04/06/17 1426 04/06/17 1442 04/06/17 1733 04/07/17 0243 04/08/17 1044 04/09/17 0420 04/10/17 0538  NA 139 139  --  140 140  --  138  K 4.0 4.0  --  3.0* 3.8  --  4.7  CL 105 105  --  105 109  --  104  CO2 24  --   --  26 24  --  26  GLUCOSE 100* 100*  --  100* 121*  --  113*  BUN 17 20  --  13 12  --  18  CREATININE 0.89 0.90 0.85 0.89 0.87  --  1.02*  CALCIUM 9.6  --   --  9.1 9.3  --  9.5  MG  --   --  1.4*  --  1.6* 2.2  --   PHOS  --   --  3.0  --   --   --   --    Liver Function Tests:  Recent Labs Lab 04/06/17 1426 04/08/17 1044 04/10/17 0538  AST 16 23 26   ALT 12* 11* 16  ALKPHOS 47 46 43  BILITOT 0.4 0.4 0.7  PROT 6.5 6.5 6.4*  ALBUMIN 3.8 3.5 3.4*    Recent Labs Lab 04/08/17 1044  LIPASE 32   No results for input(s): AMMONIA in the last 168 hours. CBC:  Recent Labs Lab 04/06/17 1426 04/06/17 1442 04/06/17 1733 04/07/17 0243 04/08/17 1044 04/10/17 0538  WBC 7.6  --  7.0  7.0 8.0 6.6  NEUTROABS 5.0  --   --  3.4  --   --   HGB 13.0 13.3 12.6 12.1 13.5 13.2  HCT 38.9 39.0 37.6 36.2 40.6 39.6  MCV 87.4  --  87.0 87.0 87.1 88.0  PLT 264  --  261 257 287 276   Cardiac Enzymes:  Recent Labs Lab 04/08/17 1044 04/08/17 1637 04/08/17 2235 04/09/17 0420  CKTOTAL  --   --  72  --   CKMB  --   --  2.1  --   TROPONINI 0.06* 0.25* 0.24* 0.21*   BNP: Invalid input(s): POCBNP CBG: No results for input(s): GLUCAP in the last 168 hours. D-Dimer  Recent Labs  04/08/17 1044  DDIMER 0.31   Hgb A1c No results for input(s): HGBA1C in the last 72 hours. Lipid Profile No results for input(s): CHOL, HDL, LDLCALC, TRIG, CHOLHDL, LDLDIRECT in the last 72 hours. Thyroid function studies  Recent Labs  04/08/17 1044  TSH 2.101   Anemia work up No results for input(s): VITAMINB12, FOLATE, FERRITIN, TIBC, IRON, RETICCTPCT in the last 72 hours. Urinalysis    Component Value Date/Time   COLORURINE YELLOW 04/08/2017 1101   APPEARANCEUR CLEAR 04/08/2017 1101   LABSPEC 1.013 04/08/2017 1101   PHURINE 5.0 04/08/2017 1101   GLUCOSEU NEGATIVE 04/08/2017 1101   HGBUR SMALL (A) 04/08/2017 1101   BILIRUBINUR NEGATIVE 04/08/2017 1101   KETONESUR NEGATIVE 04/08/2017 1101   PROTEINUR NEGATIVE 04/08/2017 1101   UROBILINOGEN 0.2 12/13/2013 1638   NITRITE NEGATIVE 04/08/2017 1101   LEUKOCYTESUR NEGATIVE 04/08/2017 1101   Sepsis Labs Invalid input(s): PROCALCITONIN,  WBC,  LACTICIDVEN Microbiology No results found for this or any previous visit (from the past 240 hour(s)).   Time coordinating discharge: 32 minutes  SIGNED:   Rosita Fire, MD  [facility] 04/10/2017, 10:39 AM  If 7PM-7AM, please contact night-coverage [email or web address] Password [email or web address]

## 2017-04-10 NOTE — Consult Note (Signed)
           [facility] CM Primary Care Navigator  04/10/2017  Tina Ellis 07/27/1941 [id number]   Wentto see patient at the bedside to identify possible discharge needs but she was just discharged. Staff reports that patient was discharged home today.  Primary care provider's office called (Jessica)to notify of patient's discharge and need for post hospital follow-up and transition of care. Notified also of patient's health issues needing follow-up.  Made aware to refer patient to [facility] care management if deemed appropriatefor services.  For questions, please contact:  Dannielle Huh, BSN, RN- [facility] Primary Care Navigator  Telephone: [phone] [place]

## 2017-04-10 NOTE — Care Management Note (Signed)
Case Management Note  Patient Details  Name: VAYLA WILHELMI MRN: [id number] Date of Birth: 1941-06-23  Subjective/Objective:                    Action/Plan: Pt discharging home with self care. No f/u per PT/OT and no DME needs. Pt with insurance and PCP.  Expected Discharge Date:  04/10/17               Expected Discharge Plan:  Home/Self Care  In-House Referral:     Discharge planning Services     Post Acute Care Choice:    Choice offered to:     DME Arranged:    DME Agency:     HH Arranged:    HH Agency:     Status of Service:  Completed, signed off  If discussed at [employer] of Stay Meetings, dates discussed:    Additional Comments:  Pollie Friar, RN 04/10/2017, 10:58 AM

## 2017-04-17 ENCOUNTER — Ambulatory Visit (INDEPENDENT_AMBULATORY_CARE_PROVIDER_SITE_OTHER): Payer: Medicare Other | Admitting: Neurology

## 2017-04-17 ENCOUNTER — Encounter (INDEPENDENT_AMBULATORY_CARE_PROVIDER_SITE_OTHER): Payer: Self-pay

## 2017-04-17 ENCOUNTER — Encounter: Payer: Self-pay | Admitting: Neurology

## 2017-04-17 VITALS — BP 92/56 | HR 62 | Ht 67.0 in | Wt 156.0 lb

## 2017-04-17 DIAGNOSIS — I10 Essential (primary) hypertension: Secondary | ICD-10-CM

## 2017-04-17 DIAGNOSIS — G459 Transient cerebral ischemic attack, unspecified: Secondary | ICD-10-CM

## 2017-04-17 NOTE — Progress Notes (Signed)
PATIENT: Tina Ellis DOB: 1941-07-14  Chief Complaint  Patient presents with  . Transient Ischemic Attack    She is here with her son, [name]. Presented to ED on 02/24/17 with the following symptoms: increased BP, blurred vision, racing heart, sweating, mild headache, fatigue, shortness of breath, dry cough.  She went to ED again on 04/06/17 for elevated blood pressure and numbness/tingling in left arm.  Says her symptoms have improved with the exception of leg weakness and fatigue.  She has decreased her Coreg 12.5mg  to 0.5 tablet each day due to low BP.  Marland Kitchen PCP    [name], MD (referred from ED)     HISTORICAL  Tina SCHWARZKOPF is a 76 years old right-handed female, accompanied by her son [name], seen in refer by her primary care doctor [name] for evaluation of transient ischemic attack, initial evaluation was on April 17 2017.  She had a past medical history of hypertension, hyperlipidemia, lumbar decompression surgery twice, most recent one was in 2013, which did help her symptoms of low back pain, radiating pain to her bilateral lower extremity. She lives with her son's family.  On April 06 2017, she woke up feeling anxious, blood pressure was significantly elevated 200 over 120, at the same time, she fell transient paresthesia involving her left arm, hand, left mouth corner, lasting for few minutes, she denies dysarthria, no significant weakness, she was taken to the emergency room,   I have personally reviewed CT head, MRI of the brain and MRA of the brain with no acute finding. Carotid Doppler ultrasound consistent with mild atherosclerotic disease with no critical stenosis. Echocardiogram showed EF of 65-70% with normal wall motion. LDL 101, A1c 5.7.  She is now back to baseline, her aspirin dosage was increased from 81 to current 325 mg by day, Crestor dosage was increased to 10 mg daily  There was mild elevated creatinine, she was evaluated by cardiologist, planning  outpatient cardiac stress testing,  There was modification of her blood pressure medications, she was taking Cozaar 100 mg every day, hydrochlorothiazide 25 mg every day, since her hospital presentation April 2018, she was given Coreg 12 point 5 mg twice a day, she feeling fatigue, since the new medication changes, she brought her daily blood pressure documentation, in the range of 90 -130/56-72, she does complains of increased fatigue with lower blood pressure 90/56,  I reviewed laboratory evaluation, LDL was mildly elevated 101, A1c was 5.7, mild elevated creatinine 1.02, elevated troponin 0.21, no significant change on EKG,  REVIEW OF SYSTEMS: Full 14 system review of systems performed and notable only for as above  ALLERGIES: No Known Allergies  HOME MEDICATIONS: Current Outpatient Prescriptions  Medication Sig Dispense Refill  . acetaminophen (TYLENOL) 500 MG tablet Take 500 mg by mouth every 6 (six) hours as needed (for pain).     Marland Kitchen aspirin EC 325 MG tablet Take 1 tablet (325 mg total) by mouth daily. 30 tablet 0  . carvedilol (COREG) 12.5 MG tablet Take 1 tablet (12.5 mg total) by mouth 2 (two) times daily with a meal. 60 tablet 0  . cetirizine (ZYRTEC) 10 MG tablet Take 10 mg by mouth at bedtime.    . cholecalciferol (VITAMIN D) 1000 UNITS tablet Take 2,000 Units by mouth every morning.    . hydrochlorothiazide (HYDRODIURIL) 25 MG tablet Take 25 mg by mouth every morning.    Marland Kitchen losartan (COZAAR) 100 MG tablet Take 100 mg by mouth every morning.    Marland Kitchen  omeprazole (PRILOSEC) 20 MG capsule Take 20 mg by mouth every morning.     . rosuvastatin (CRESTOR) 10 MG tablet Take 1 tablet (10 mg total) by mouth every evening. 30 tablet 0   No current facility-administered medications for this visit.     PAST MEDICAL HISTORY: Past Medical History:  Diagnosis Date  . GERD (gastroesophageal reflux disease)   . Hypercholesteremia   . Hypertension   . Seasonal allergies   . TIA (transient  ischemic attack)     PAST SURGICAL HISTORY: Past Surgical History:  Procedure Laterality Date  . BACK SURGERY     x 2  . CHOLECYSTECTOMY    . PARTIAL HYSTERECTOMY      FAMILY HISTORY: Family History  Problem Relation Age of Onset  . Colon cancer Mother   . Lung cancer Father   . Crohn's disease Brother     SOCIAL HISTORY:  Social History   Social History  . Marital status: Married    Spouse name: N/A  . Number of children: 2  . Years of education: HS   Occupational History  . Part-time Press photographer    Social History Main Topics  . Smoking status: Never Smoker  . Smokeless tobacco: Never Used  . Alcohol use No  . Drug use: No  . Sexual activity: Not Currently   Other Topics Concern  . Not on file   Social History Narrative   Lives at home with son and grandson.   Right-handed.   1-2 cups caffeine per day.        PHYSICAL EXAM   Vitals:   04/17/17 0930  BP: (!) 92/56  Pulse: 62  Weight: 156 lb (70.8 kg)  Height: 5\' 7"  (1.702 m)    Not recorded      Body mass index is 24.43 kg/m.  PHYSICAL EXAMNIATION:  Gen: NAD, conversant, well nourised, obese, well groomed                     Cardiovascular: Regular rate rhythm, no peripheral edema, warm, nontender. Eyes: Conjunctivae clear without exudates or hemorrhage Neck: Supple, no carotid bruits. Pulmonary: Clear to auscultation bilaterally   NEUROLOGICAL EXAM:  MENTAL STATUS: Speech:    Speech is normal; fluent and spontaneous with normal comprehension.  Cognition:     Orientation to time, place and person     Normal recent and remote memory     Normal Attention span and concentration     Normal Language, naming, repeating,spontaneous speech     Fund of knowledge   CRANIAL NERVES: CN II: Visual fields are full to confrontation. Fundoscopic exam is normal with sharp discs and no vascular changes. Pupils are round equal and briskly reactive to light. CN III, IV, VI: extraocular movement are  normal. No ptosis. CN V: Facial sensation is intact to pinprick in all 3 divisions bilaterally. Corneal responses are intact.  CN VII: Face is symmetric with normal eye closure and smile. CN VIII: Hearing is normal to rubbing fingers CN IX, X: Palate elevates symmetrically. Phonation is normal. CN XI: Head turning and shoulder shrug are intact CN XII: Tongue is midline with normal movements and no atrophy.  MOTOR: There is no pronator drift of out-stretched arms. Muscle bulk and tone are normal. Muscle strength is normal.  REFLEXES: Reflexes are 2+ and symmetric at the biceps, triceps, knees, and ankles. Plantar responses are flexor.  SENSORY: Intact to light touch, pinprick, positional sensation and vibratory sensation are intact in fingers  and toes.  COORDINATION: Rapid alternating movements and fine finger movements are intact. There is no dysmetria on finger-to-nose and heel-knee-shin.    GAIT/STANCE: Posture is normal. Gait is steady with normal steps, base, arm swing, and turning. Heel and toe walking are normal. Tandem gait is normal.  Romberg is absent.   DIAGNOSTIC DATA (LABS, IMAGING, TESTING) - I reviewed patient records, labs, notes, testing and imaging myself where available.   ASSESSMENT AND PLAN  AMMY LIENHARD is a 76 y.o. female   TIA  Vascular risk factor of hypertension, hyperlipidemia,  Continue aspirin 325 mg every day  Continue document daily blood pressure, if her blood pressure is less than 100/70, she should hold off Coreg, continue Cozaar 100 mg daily, hydrochlorothiazide 25 mg daily,  Continue follow-up with her primary care physician    Marcial Pacas, M.D. Ph.D.  [facility] [address], [place], [place] [address] Ph: [phone] Fax: [phone]  CC: [name], MD

## 2017-04-19 DIAGNOSIS — I10 Essential (primary) hypertension: Secondary | ICD-10-CM | POA: Diagnosis not present

## 2017-04-23 DIAGNOSIS — I10 Essential (primary) hypertension: Secondary | ICD-10-CM | POA: Diagnosis not present

## 2017-05-07 DIAGNOSIS — I10 Essential (primary) hypertension: Secondary | ICD-10-CM | POA: Diagnosis not present

## 2017-08-08 DIAGNOSIS — E782 Mixed hyperlipidemia: Secondary | ICD-10-CM | POA: Diagnosis not present

## 2017-08-08 DIAGNOSIS — K219 Gastro-esophageal reflux disease without esophagitis: Secondary | ICD-10-CM | POA: Diagnosis not present

## 2017-08-08 DIAGNOSIS — M25531 Pain in right wrist: Secondary | ICD-10-CM | POA: Diagnosis not present

## 2017-08-08 DIAGNOSIS — M19031 Primary osteoarthritis, right wrist: Secondary | ICD-10-CM | POA: Diagnosis not present

## 2017-08-08 DIAGNOSIS — M791 Myalgia: Secondary | ICD-10-CM | POA: Diagnosis not present

## 2017-08-15 DIAGNOSIS — M1811 Unilateral primary osteoarthritis of first carpometacarpal joint, right hand: Secondary | ICD-10-CM | POA: Diagnosis not present

## 2017-08-15 DIAGNOSIS — S63501A Unspecified sprain of right wrist, initial encounter: Secondary | ICD-10-CM | POA: Diagnosis not present

## 2017-08-31 DIAGNOSIS — B078 Other viral warts: Secondary | ICD-10-CM | POA: Diagnosis not present

## 2017-09-20 DIAGNOSIS — M791 Myalgia: Secondary | ICD-10-CM | POA: Diagnosis not present

## 2017-09-20 DIAGNOSIS — Z23 Encounter for immunization: Secondary | ICD-10-CM | POA: Diagnosis not present

## 2017-09-20 DIAGNOSIS — R0981 Nasal congestion: Secondary | ICD-10-CM | POA: Diagnosis not present

## 2017-10-06 DIAGNOSIS — H9209 Otalgia, unspecified ear: Secondary | ICD-10-CM | POA: Diagnosis not present

## 2017-10-16 ENCOUNTER — Other Ambulatory Visit: Payer: Self-pay | Admitting: Internal Medicine

## 2017-10-16 DIAGNOSIS — Z1231 Encounter for screening mammogram for malignant neoplasm of breast: Secondary | ICD-10-CM

## 2017-10-25 ENCOUNTER — Ambulatory Visit
Admission: RE | Admit: 2017-10-25 | Discharge: 2017-10-25 | Disposition: A | Discharge status: Home / Self Care | Payer: Medicare Other | Source: Ambulatory Visit | Attending: Internal Medicine | Admitting: Internal Medicine

## 2017-10-25 DIAGNOSIS — M8580 Other specified disorders of bone density and structure, unspecified site: Secondary | ICD-10-CM | POA: Diagnosis not present

## 2017-10-25 DIAGNOSIS — N390 Urinary tract infection, site not specified: Secondary | ICD-10-CM | POA: Diagnosis not present

## 2017-10-25 DIAGNOSIS — Z1231 Encounter for screening mammogram for malignant neoplasm of breast: Secondary | ICD-10-CM | POA: Diagnosis not present

## 2017-10-25 DIAGNOSIS — E7800 Pure hypercholesterolemia, unspecified: Secondary | ICD-10-CM | POA: Diagnosis not present

## 2017-10-25 DIAGNOSIS — E782 Mixed hyperlipidemia: Secondary | ICD-10-CM | POA: Diagnosis not present

## 2017-10-25 DIAGNOSIS — I10 Essential (primary) hypertension: Secondary | ICD-10-CM | POA: Diagnosis not present

## 2017-10-25 DIAGNOSIS — E876 Hypokalemia: Secondary | ICD-10-CM | POA: Diagnosis not present

## 2017-10-28 DIAGNOSIS — I10 Essential (primary) hypertension: Secondary | ICD-10-CM | POA: Diagnosis not present

## 2017-10-28 DIAGNOSIS — M25562 Pain in left knee: Secondary | ICD-10-CM | POA: Diagnosis not present

## 2017-11-07 DIAGNOSIS — E7800 Pure hypercholesterolemia, unspecified: Secondary | ICD-10-CM | POA: Diagnosis not present

## 2017-11-07 DIAGNOSIS — K219 Gastro-esophageal reflux disease without esophagitis: Secondary | ICD-10-CM | POA: Diagnosis not present

## 2017-11-07 DIAGNOSIS — R319 Hematuria, unspecified: Secondary | ICD-10-CM | POA: Diagnosis not present

## 2017-11-07 DIAGNOSIS — I10 Essential (primary) hypertension: Secondary | ICD-10-CM | POA: Diagnosis not present

## 2017-11-07 DIAGNOSIS — M8580 Other specified disorders of bone density and structure, unspecified site: Secondary | ICD-10-CM | POA: Diagnosis not present

## 2017-11-07 DIAGNOSIS — E559 Vitamin D deficiency, unspecified: Secondary | ICD-10-CM | POA: Diagnosis not present

## 2017-11-07 DIAGNOSIS — K635 Polyp of colon: Secondary | ICD-10-CM | POA: Diagnosis not present

## 2017-11-07 DIAGNOSIS — R790 Abnormal level of blood mineral: Secondary | ICD-10-CM | POA: Diagnosis not present

## 2017-11-13 DIAGNOSIS — M859 Disorder of bone density and structure, unspecified: Secondary | ICD-10-CM | POA: Diagnosis not present

## 2017-11-13 DIAGNOSIS — M8580 Other specified disorders of bone density and structure, unspecified site: Secondary | ICD-10-CM | POA: Diagnosis not present

## 2017-11-13 DIAGNOSIS — E559 Vitamin D deficiency, unspecified: Secondary | ICD-10-CM | POA: Diagnosis not present

## 2018-01-03 DIAGNOSIS — I10 Essential (primary) hypertension: Secondary | ICD-10-CM | POA: Diagnosis not present

## 2018-01-03 DIAGNOSIS — R790 Abnormal level of blood mineral: Secondary | ICD-10-CM | POA: Diagnosis not present

## 2018-02-21 DIAGNOSIS — I10 Essential (primary) hypertension: Secondary | ICD-10-CM | POA: Diagnosis not present

## 2018-02-21 DIAGNOSIS — E860 Dehydration: Secondary | ICD-10-CM | POA: Diagnosis not present

## 2018-02-21 DIAGNOSIS — J019 Acute sinusitis, unspecified: Secondary | ICD-10-CM | POA: Diagnosis not present

## 2018-02-21 DIAGNOSIS — I959 Hypotension, unspecified: Secondary | ICD-10-CM | POA: Diagnosis not present

## 2018-03-04 DIAGNOSIS — I10 Essential (primary) hypertension: Secondary | ICD-10-CM | POA: Diagnosis not present

## 2018-03-04 DIAGNOSIS — E876 Hypokalemia: Secondary | ICD-10-CM | POA: Diagnosis not present

## 2018-03-04 DIAGNOSIS — E860 Dehydration: Secondary | ICD-10-CM | POA: Diagnosis not present

## 2018-03-07 DIAGNOSIS — I10 Essential (primary) hypertension: Secondary | ICD-10-CM | POA: Diagnosis not present

## 2018-03-12 DIAGNOSIS — I10 Essential (primary) hypertension: Secondary | ICD-10-CM | POA: Diagnosis not present

## 2018-03-15 ENCOUNTER — Emergency Department (HOSPITAL_COMMUNITY)
Admission: EM | Admit: 2018-03-15 | Discharge: 2018-03-16 | Disposition: A | Discharge status: Home / Self Care | Payer: Medicare Other | Attending: Emergency Medicine | Admitting: Emergency Medicine

## 2018-03-15 ENCOUNTER — Encounter (HOSPITAL_COMMUNITY): Payer: Self-pay

## 2018-03-15 DIAGNOSIS — I10 Essential (primary) hypertension: Secondary | ICD-10-CM | POA: Diagnosis not present

## 2018-03-15 DIAGNOSIS — R030 Elevated blood-pressure reading, without diagnosis of hypertension: Secondary | ICD-10-CM | POA: Diagnosis not present

## 2018-03-15 DIAGNOSIS — Z79899 Other long term (current) drug therapy: Secondary | ICD-10-CM | POA: Diagnosis not present

## 2018-03-15 DIAGNOSIS — Z7982 Long term (current) use of aspirin: Secondary | ICD-10-CM | POA: Diagnosis not present

## 2018-03-15 MED ORDER — AMLODIPINE BESYLATE 5 MG PO TABS
5.0000 mg | ORAL_TABLET | Freq: Once | ORAL | Status: AC
  Administered 2018-03-16: 5 mg via ORAL
  Filled 2018-03-15: qty 1

## 2018-03-15 NOTE — ED Notes (Addendum)
Tina Ellis [phone]. Call for discharge.

## 2018-03-15 NOTE — ED Triage Notes (Signed)
Pt complains of high blood pressure and her doctor told her to come in She had a medication change last week and since then she's been very tired, tonight she was laying down and become hot and check her blood pressure and it was elevated, she had already took her medicine and her pressure was going up

## 2018-03-16 ENCOUNTER — Emergency Department (HOSPITAL_COMMUNITY): Payer: Medicare Other

## 2018-03-16 ENCOUNTER — Encounter (HOSPITAL_COMMUNITY): Payer: Self-pay | Admitting: Emergency Medicine

## 2018-03-16 DIAGNOSIS — I10 Essential (primary) hypertension: Secondary | ICD-10-CM | POA: Diagnosis not present

## 2018-03-16 DIAGNOSIS — R030 Elevated blood-pressure reading, without diagnosis of hypertension: Secondary | ICD-10-CM | POA: Diagnosis not present

## 2018-03-16 LAB — I-STAT CHEM 8, ED
BUN: 29 mg/dL — AB (ref 6–20)
CHLORIDE: 103 mmol/L (ref 101–111)
CREATININE: 1.1 mg/dL — AB (ref 0.44–1.00)
Calcium, Ion: 1.16 mmol/L (ref 1.15–1.40)
GLUCOSE: 110 mg/dL — AB (ref 65–99)
HCT: 38 % (ref 36.0–46.0)
Hemoglobin: 12.9 g/dL (ref 12.0–15.0)
POTASSIUM: 4.9 mmol/L (ref 3.5–5.1)
Sodium: 140 mmol/L (ref 135–145)
TCO2: 28 mmol/L (ref 22–32)

## 2018-03-16 LAB — CBC WITH DIFFERENTIAL/PLATELET
Basophils Absolute: 0.1 10*3/uL (ref 0.0–0.1)
Basophils Relative: 2 %
Eosinophils Absolute: 1 10*3/uL — ABNORMAL HIGH (ref 0.0–0.7)
Eosinophils Relative: 12 %
HEMATOCRIT: 38.1 % (ref 36.0–46.0)
HEMOGLOBIN: 12.9 g/dL (ref 12.0–15.0)
LYMPHS ABS: 1.9 10*3/uL (ref 0.7–4.0)
Lymphocytes Relative: 22 %
MCH: 29.9 pg (ref 26.0–34.0)
MCHC: 33.9 g/dL (ref 30.0–36.0)
MCV: 88.2 fL (ref 78.0–100.0)
MONOS PCT: 12 %
Monocytes Absolute: 1 10*3/uL (ref 0.1–1.0)
NEUTROS ABS: 4.4 10*3/uL (ref 1.7–7.7)
NEUTROS PCT: 52 %
Platelets: 274 10*3/uL (ref 150–400)
RBC: 4.32 MIL/uL (ref 3.87–5.11)
RDW: 13.8 % (ref 11.5–15.5)
WBC: 8.5 10*3/uL (ref 4.0–10.5)

## 2018-03-16 LAB — I-STAT TROPONIN, ED: Troponin i, poc: 0.01 ng/mL (ref 0.00–0.08)

## 2018-03-16 NOTE — ED Provider Notes (Signed)
[facility] DEPT Provider Note   CSN: [id number] Arrival date & time: 03/15/18  2252     History   Chief Complaint Chief Complaint  Patient presents with  . Hypertension    HPI Tina Ellis is a 77 y.o. female.  The history is provided by the patient.  Hypertension  This is a chronic problem. The current episode started more than 1 week ago. The problem occurs constantly. The problem has been gradually worsening. Pertinent negatives include no chest pain, no abdominal pain, no headaches and no shortness of breath. Nothing aggravates the symptoms. Nothing relieves the symptoms. She has tried nothing for the symptoms. The treatment provided no relief.  In the setting of recent medication changes she is asymptomatic.      Past Medical History:  Diagnosis Date  . GERD (gastroesophageal reflux disease)   . Hypercholesteremia   . Hypertension   . Seasonal allergies   . TIA (transient ischemic attack)     Patient Active Problem List   Diagnosis Date Noted  . Elevated troponin 04/09/2017  . TIA (transient ischemic attack) 04/08/2017  . Abdominal pain   . Stroke-like symptom 04/06/2017  . HTN (hypertension) 04/06/2017  . GERD (gastroesophageal reflux disease) 04/06/2017  . Influenza A 12/15/2013  . Hypotension 12/14/2013  . Acute renal failure ([place]) 12/14/2013  . UTI (lower urinary tract infection) 12/13/2013  . Hypotension, unspecified 12/13/2013  . Dehydration 12/13/2013    Past Surgical History:  Procedure Laterality Date  . BACK SURGERY     x 2  . CHOLECYSTECTOMY    . PARTIAL HYSTERECTOMY       OB History   None      Home Medications    Prior to Admission medications   Medication Sig Start Date End Date Taking? Authorizing Provider  aspirin EC 325 MG tablet Take 1 tablet (325 mg total) by mouth daily. 04/10/17  Yes Rosita Fire, MD  carvedilol (COREG) 12.5 MG tablet Take 1 tablet (12.5 mg total) by mouth 2 (two)  times daily with a meal. Patient taking differently: Take 3.125 mg by mouth 2 (two) times daily with a meal.  04/10/17  Yes Rosita Fire, MD  cetirizine (ZYRTEC) 10 MG tablet Take 10 mg by mouth at bedtime.   Yes [provider]  cholecalciferol (VITAMIN D) 1000 UNITS tablet Take 2,000 Units by mouth every morning.   Yes [provider]  losartan (COZAAR) 100 MG tablet Take 100 mg by mouth every morning.   Yes [provider]  omeprazole (PRILOSEC) 20 MG capsule Take 20 mg by mouth every morning.    Yes [provider]  rosuvastatin (CRESTOR) 10 MG tablet Take 1 tablet (10 mg total) by mouth every evening. 04/10/17  Yes Rosita Fire, MD  spironolactone (ALDACTONE) 25 MG tablet Take 25 mg by mouth daily. 03/04/18  Yes [provider]    Family History Family History  Problem Relation Age of Onset  . Colon cancer Mother   . Lung cancer Father   . Crohn's disease Brother     Social History Social History   Tobacco Use  . Smoking status: Never Smoker  . Smokeless tobacco: Never Used  Substance Use Topics  . Alcohol use: No  . Drug use: No     Allergies   Patient has no known allergies.   Review of Systems Review of Systems  Constitutional: Negative for diaphoresis.  HENT: Negative for trouble swallowing.   Eyes:  Negative for visual disturbance.  Respiratory: Negative for shortness of breath.   Cardiovascular: Negative for chest pain.  Gastrointestinal: Negative for abdominal pain, diarrhea, nausea and vomiting.  Musculoskeletal: Negative for neck pain and neck stiffness.  Neurological: Negative for syncope, facial asymmetry, speech difficulty, weakness, numbness and headaches.  All other systems reviewed and are negative.    Physical Exam Updated Vital Signs BP (!) 165/88   Pulse 69   Temp 98 F (36.7 C) (Oral)   Resp (!) 24   Wt 69 kg (152 lb 1.6 oz)   SpO2 100%   BMI 23.82 kg/m   Physical Exam    Constitutional: She is oriented to person, place, and time. She appears well-developed and well-nourished. No distress.  HENT:  Head: Normocephalic and atraumatic.  Mouth/Throat: No oropharyngeal exudate.  Eyes: Pupils are equal, round, and reactive to light. EOM are normal.  Neck: Normal range of motion. Neck supple.  Cardiovascular: Normal rate, regular rhythm, normal heart sounds and intact distal pulses.  Pulmonary/Chest: Effort normal and breath sounds normal. No stridor. She has no wheezes. She has no rales.  Abdominal: Soft. Bowel sounds are normal. She exhibits no mass. There is no tenderness. There is no rebound and no guarding.  Musculoskeletal: Normal range of motion.  Neurological: She is alert and oriented to person, place, and time. She displays normal reflexes. No cranial nerve deficit.  Skin: Skin is warm and dry. Capillary refill takes less than 2 seconds.  Psychiatric: She has a normal mood and affect.     ED Treatments / Results  Labs (all labs ordered are listed, but only abnormal results are displayed) Labs Reviewed  CBC WITH DIFFERENTIAL/PLATELET - Abnormal; Notable for the following components:      Result Value   Eosinophils Absolute 1.0 (*)    All other components within normal limits  I-STAT CHEM 8, ED - Abnormal; Notable for the following components:   BUN 29 (*)    Creatinine, Ser 1.10 (*)    Glucose, Bld 110 (*)    All other components within normal limits  I-STAT TROPONIN, ED    EKG  EKG Interpretation  Date/Time:  Saturday March 16 2018 00:03:28 EDT Ventricular Rate:  76 PR Interval:    QRS Duration: 96 QT Interval:  401 QTC Calculation: 451 R Axis:   61 Text Interpretation:  Normal sinus rhythm Reconfirmed by Randal Buba, Suzzanne Brunkhorst ([id number]) on 03/16/2018 1:39:27 AM       Radiology Dg Chest 2 View  Result Date: 03/16/2018 CLINICAL DATA:  High blood pressure. EXAM: CHEST - 2 VIEW COMPARISON:  03/09/2016 FINDINGS: The cardiomediastinal contours  are normal. Probable lingular scarring. Pulmonary vasculature is normal. No consolidation, pleural effusion, or pneumothorax. No acute osseous abnormalities are seen. IMPRESSION: No acute finding. Electronically Signed   By: Jeb Levering M.D.   On: 03/16/2018 00:59    Procedures Procedures (including critical care time)  Medications Ordered in ED Medications  amLODipine (NORVASC) tablet 5 mg (5 mg Oral Given 03/16/18 0039)     Final Clinical Impressions(s) / ED Diagnoses   Return for weakness, numbness, changes in vision or speech, fevers >100.4 unrelieved by medication, shortness of breath, intractable vomiting, or diarrhea, abdominal pain, Inability to tolerate liquids or food, cough, altered mental status or any concerns. No signs of systemic illness or infection. The patient is nontoxic-appearing on exam and vital signs are within normal limits.   I have reviewed the triage vital signs and the nursing notes. Pertinent labs &  imaging results that were available during my care of the patient were reviewed by me and considered in my medical decision making (see chart for details).  After history, exam, and medical workup I feel the patient has been appropriately medically screened and is safe for discharge home. Pertinent diagnoses were discussed with the patient. Patient was given return precautions.   Tiffanye Hartmann, MD 03/16/18 [phone]

## 2018-03-18 DIAGNOSIS — I10 Essential (primary) hypertension: Secondary | ICD-10-CM | POA: Diagnosis not present

## 2018-04-04 DIAGNOSIS — R790 Abnormal level of blood mineral: Secondary | ICD-10-CM | POA: Diagnosis not present

## 2018-04-04 DIAGNOSIS — E7800 Pure hypercholesterolemia, unspecified: Secondary | ICD-10-CM | POA: Diagnosis not present

## 2018-04-04 DIAGNOSIS — I10 Essential (primary) hypertension: Secondary | ICD-10-CM | POA: Diagnosis not present

## 2018-04-15 IMAGING — MR MR HEAD W/O CM
9 of 11 series · 32 of 48 positions shown · non-contrast
Comparison: None.

CLINICAL DATA: 75 y/o  F; numbness and tingling of the right arm.

EXAM:
MRI HEAD WITHOUT CONTRAST
MRA HEAD WITHOUT CONTRAST
TECHNIQUE: Multiplanar, multiecho pulse sequences of the brain and surrounding
structures were obtained without intravenous contrast. Angiographic
images of the head were obtained using MRA technique without
contrast.

[Series 3: T1 · sagittal · 5.0mm · 0.47mm/px · 1 of 23 slices shown]
[im 1/23]
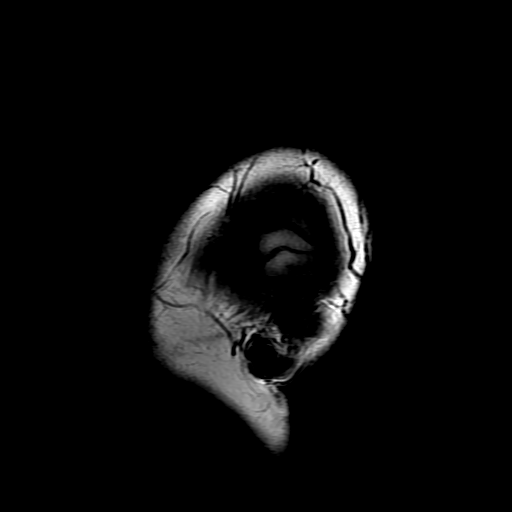

[Series 4: DWI · axial · 3.0mm · 1.09mm/px · z∈[-92,+52]mm · 8 of 100 slices shown (1 of 4)]
[im 1/100]
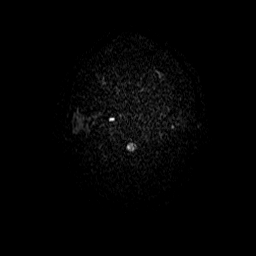
[im 15/100]
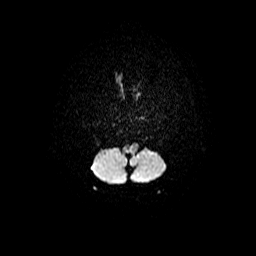
[im 29/100]
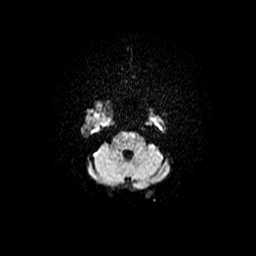
[im 43/100]
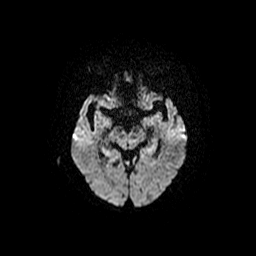
[im 57/100]
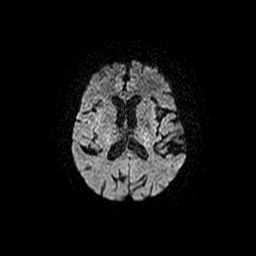
[im 71/100]
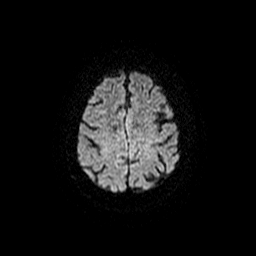
[im 85/100]
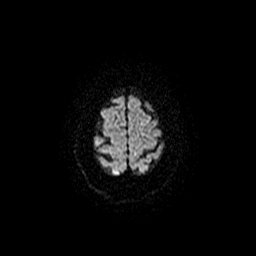
[im 100/100]
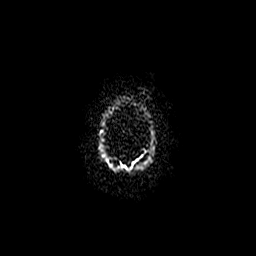

[Series 5: (id) mt fs · axial · 1.4mm · 0.43mm/px · z∈[-76,-18]mm · 5 of 140 slices shown]
[im 1/140]
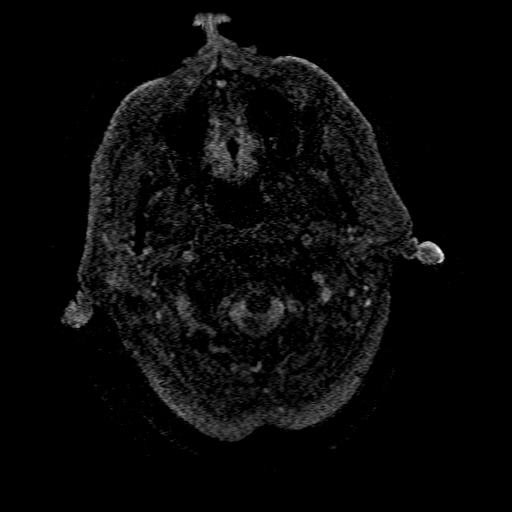
[im 28/140]
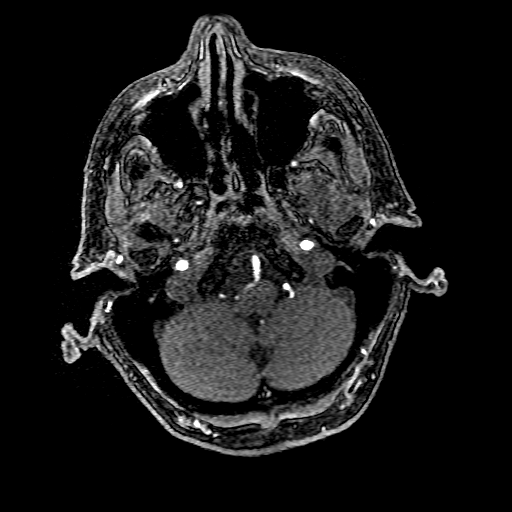
[im 42/140]
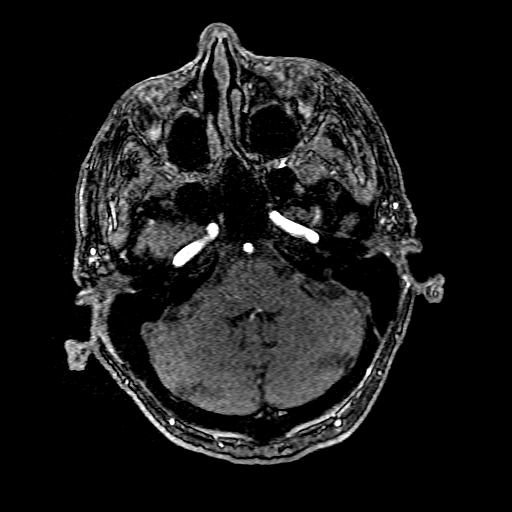
[im 56/140]
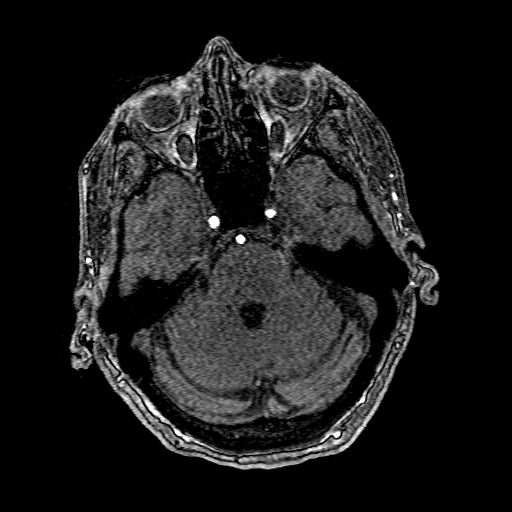
[im 84/140]
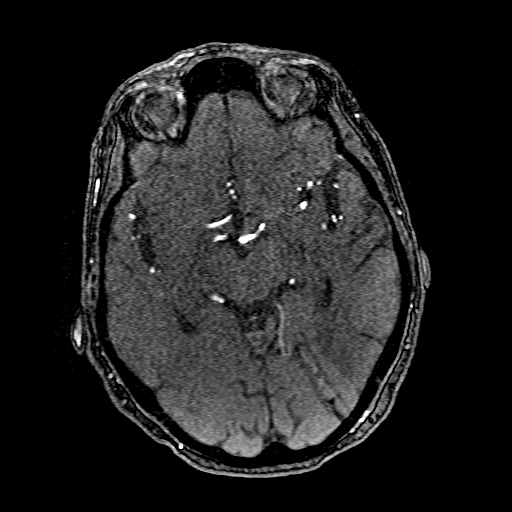

[Series 6: T2 · axial · 5.0mm · 0.43mm/px · z∈[-86,+54]mm · 2 of 25 slices shown (1 of 2)]
[im 1/25]
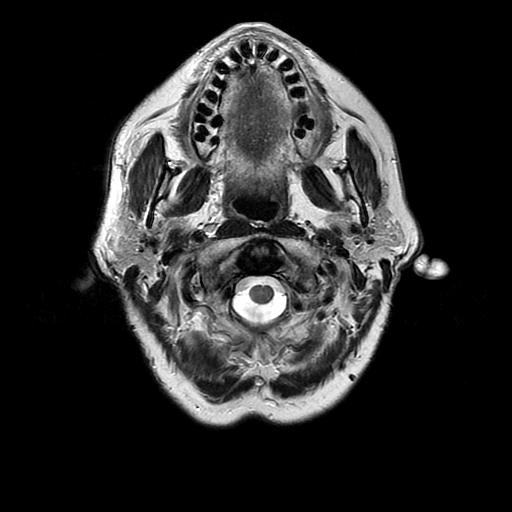
[im 25/25]
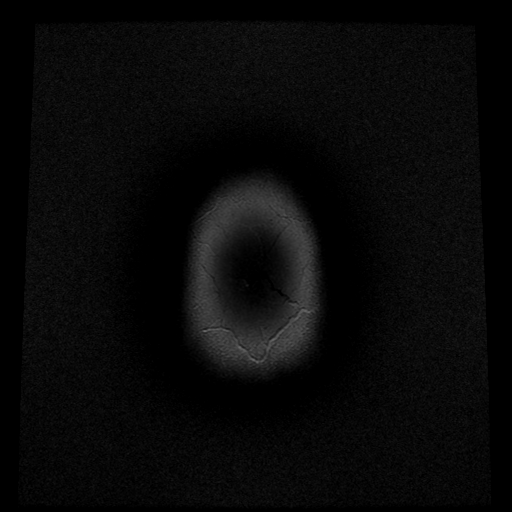

[Series 7: DWI · coronal · 5.0mm · 1.09mm/px · 5 of 68 slices shown (2 of 4)]
[im 1/68]
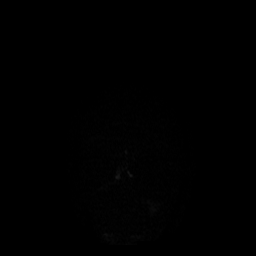
[im 17/68]
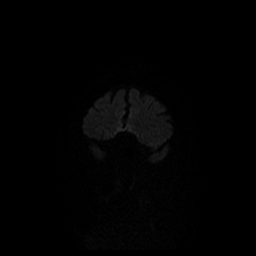
[im 34/68]
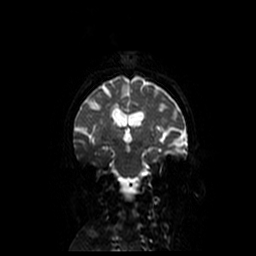
[im 51/68]
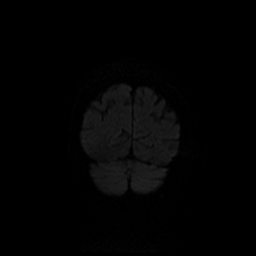
[im 68/68]
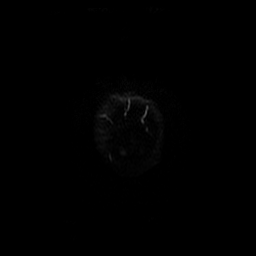

[Series 8: FLAIR · axial · 5.0mm · 0.43mm/px · z∈[-86,+54]mm · 2 of 25 slices shown]
[im 1/25]
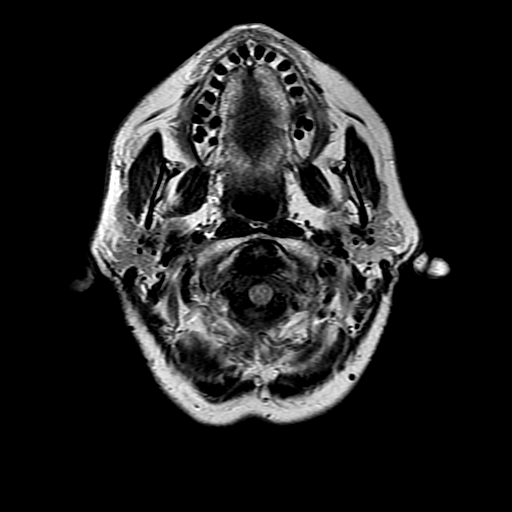
[im 25/25]
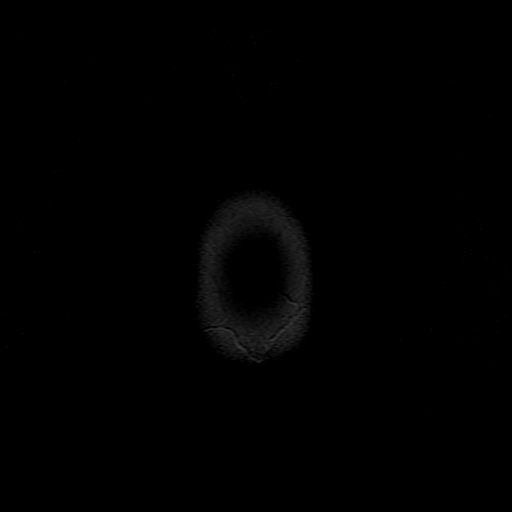

[Series 11: T2 · coronal · 5.0mm · 0.39mm/px · 2 of 26 slices shown (2 of 2)]
[im 1/26]
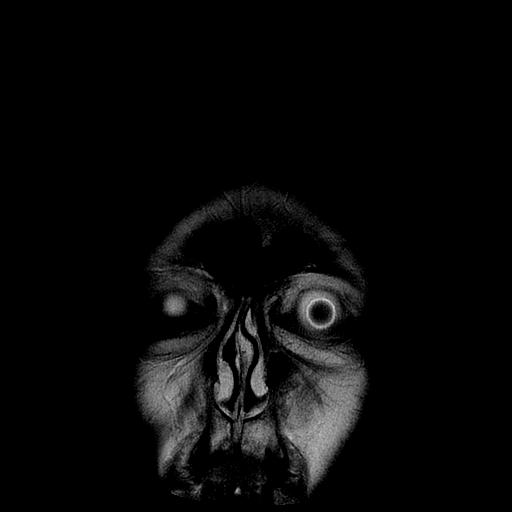
[im 26/26]
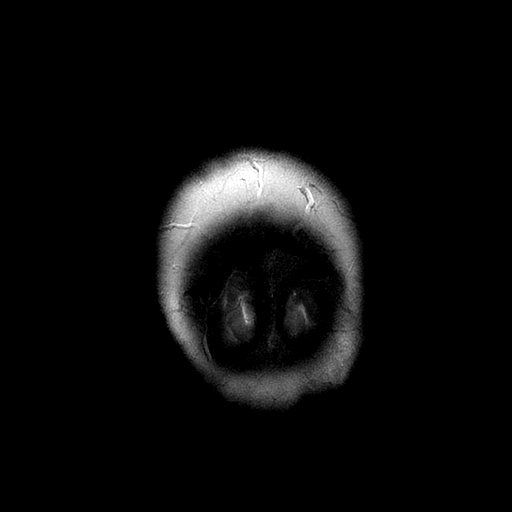

[Series 400: DWI · axial · 3.0mm · 1.09mm/px · z∈[-92,+52]mm · 4 of 50 slices shown (3 of 4)]
[im 1/50]
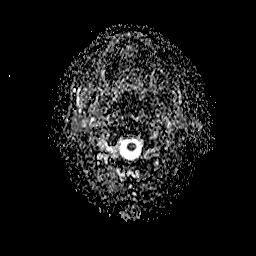
[im 17/50]
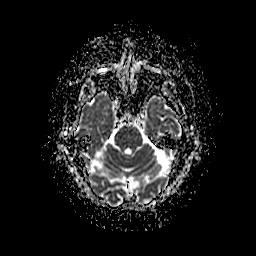
[im 33/50]
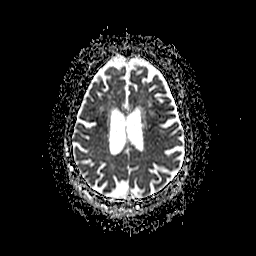
[im 50/50]
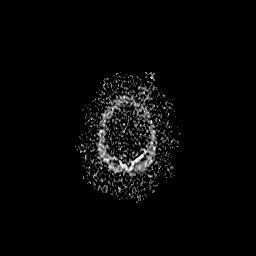

[Series 700: DWI · coronal · 5.0mm · 1.09mm/px · 3 of 34 slices shown (4 of 4)]
[im 1/34]
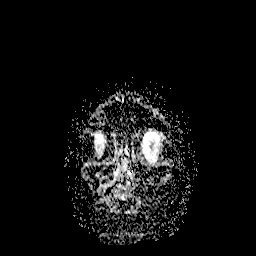
[im 17/34]
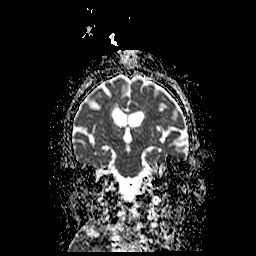
[im 34/34]
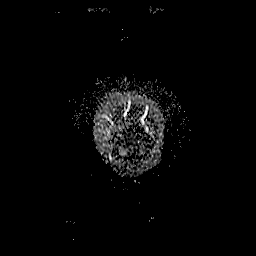

[32 of 48 positions shown; findings below may reference images not displayed]

FINDINGS: MRI HEAD FINDINGS

Brain: No acute infarction, hemorrhage, hydrocephalus, extra-axial
collection or mass lesion. Foci of T2 FLAIR hyperintense signal
abnormality in subcortical and periventricular white matter is
compatible with moderate chronic microvascular ischemic changes.
There are a few chronic periventricular lacunar infarcts in the
frontal and bilateral parietal lobes, left lentiform nucleus, and
right cerebellar hemisphere. Mild brain parenchymal volume loss.

Vascular: As below.

Skull and upper cervical spine: Normal marrow signal.

Sinuses/Orbits: Negative.

Other: None.

MRA HEAD FINDINGS

Internal carotid arteries: Patent. Mild non stenotic irregularity of
bilateral carotid and paraclinoid segments is compatible with
atherosclerotic changes.

Anterior cerebral arteries:  Patent.

Middle cerebral arteries: Patent.

Anterior communicating artery: Patent.

Posterior communicating arteries:  Patent.

Posterior cerebral arteries: Patent. Long segments of artifactual
signal loss within the P2 segments.

Basilar artery:  Patent.

Vertebral arteries:  Patent.

No evidence of high-grade stenosis, large vessel occlusion, or
aneurysm unless noted above.
IMPRESSION: 1. No acute intracranial abnormality identified.
2. Moderate for age chronic microvascular ischemic changes and mild
parenchymal volume loss of the brain.
3. Patent circle of Willis without evidence for high-grade stenosis,
large vessel occlusion, or aneurysm.

By: Dleke Tiger M.D.

## 2018-04-18 DIAGNOSIS — B078 Other viral warts: Secondary | ICD-10-CM | POA: Diagnosis not present

## 2018-05-02 DIAGNOSIS — R790 Abnormal level of blood mineral: Secondary | ICD-10-CM | POA: Diagnosis not present

## 2018-05-02 DIAGNOSIS — E7800 Pure hypercholesterolemia, unspecified: Secondary | ICD-10-CM | POA: Diagnosis not present

## 2018-05-08 DIAGNOSIS — R42 Dizziness and giddiness: Secondary | ICD-10-CM | POA: Diagnosis not present

## 2018-05-08 DIAGNOSIS — R350 Frequency of micturition: Secondary | ICD-10-CM | POA: Diagnosis not present

## 2018-05-08 DIAGNOSIS — R110 Nausea: Secondary | ICD-10-CM | POA: Diagnosis not present

## 2018-05-08 DIAGNOSIS — E860 Dehydration: Secondary | ICD-10-CM | POA: Diagnosis not present

## 2018-05-08 DIAGNOSIS — A084 Viral intestinal infection, unspecified: Secondary | ICD-10-CM | POA: Diagnosis not present

## 2018-05-16 DIAGNOSIS — E7800 Pure hypercholesterolemia, unspecified: Secondary | ICD-10-CM | POA: Diagnosis not present

## 2018-05-16 DIAGNOSIS — I10 Essential (primary) hypertension: Secondary | ICD-10-CM | POA: Diagnosis not present

## 2018-05-23 DIAGNOSIS — I10 Essential (primary) hypertension: Secondary | ICD-10-CM | POA: Diagnosis not present

## 2018-07-04 DIAGNOSIS — E782 Mixed hyperlipidemia: Secondary | ICD-10-CM | POA: Diagnosis not present

## 2018-07-04 DIAGNOSIS — I10 Essential (primary) hypertension: Secondary | ICD-10-CM | POA: Diagnosis not present

## 2018-08-01 DIAGNOSIS — I10 Essential (primary) hypertension: Secondary | ICD-10-CM | POA: Diagnosis not present

## 2018-09-26 DIAGNOSIS — Z23 Encounter for immunization: Secondary | ICD-10-CM | POA: Diagnosis not present

## 2018-10-10 DIAGNOSIS — H5203 Hypermetropia, bilateral: Secondary | ICD-10-CM | POA: Diagnosis not present

## 2018-10-10 DIAGNOSIS — H04123 Dry eye syndrome of bilateral lacrimal glands: Secondary | ICD-10-CM | POA: Diagnosis not present

## 2018-10-10 DIAGNOSIS — H52223 Regular astigmatism, bilateral: Secondary | ICD-10-CM | POA: Diagnosis not present

## 2018-10-10 DIAGNOSIS — H25813 Combined forms of age-related cataract, bilateral: Secondary | ICD-10-CM | POA: Diagnosis not present

## 2018-10-10 DIAGNOSIS — H40013 Open angle with borderline findings, low risk, bilateral: Secondary | ICD-10-CM | POA: Diagnosis not present

## 2018-10-10 DIAGNOSIS — H40053 Ocular hypertension, bilateral: Secondary | ICD-10-CM | POA: Diagnosis not present

## 2018-11-07 DIAGNOSIS — N390 Urinary tract infection, site not specified: Secondary | ICD-10-CM | POA: Diagnosis not present

## 2018-11-07 DIAGNOSIS — E559 Vitamin D deficiency, unspecified: Secondary | ICD-10-CM | POA: Diagnosis not present

## 2018-11-07 DIAGNOSIS — E7800 Pure hypercholesterolemia, unspecified: Secondary | ICD-10-CM | POA: Diagnosis not present

## 2018-11-07 DIAGNOSIS — I10 Essential (primary) hypertension: Secondary | ICD-10-CM | POA: Diagnosis not present

## 2018-11-14 DIAGNOSIS — Z0000 Encounter for general adult medical examination without abnormal findings: Secondary | ICD-10-CM | POA: Diagnosis not present

## 2018-11-14 DIAGNOSIS — K219 Gastro-esophageal reflux disease without esophagitis: Secondary | ICD-10-CM | POA: Diagnosis not present

## 2018-11-14 DIAGNOSIS — I6529 Occlusion and stenosis of unspecified carotid artery: Secondary | ICD-10-CM | POA: Diagnosis not present

## 2018-11-14 DIAGNOSIS — Z01419 Encounter for gynecological examination (general) (routine) without abnormal findings: Secondary | ICD-10-CM | POA: Diagnosis not present

## 2018-11-14 DIAGNOSIS — I10 Essential (primary) hypertension: Secondary | ICD-10-CM | POA: Diagnosis not present

## 2018-11-14 DIAGNOSIS — Z1212 Encounter for screening for malignant neoplasm of rectum: Secondary | ICD-10-CM | POA: Diagnosis not present

## 2018-11-14 DIAGNOSIS — E782 Mixed hyperlipidemia: Secondary | ICD-10-CM | POA: Diagnosis not present

## 2018-11-19 ENCOUNTER — Other Ambulatory Visit: Payer: Self-pay | Admitting: Internal Medicine

## 2018-11-19 DIAGNOSIS — I6529 Occlusion and stenosis of unspecified carotid artery: Secondary | ICD-10-CM

## 2018-11-27 ENCOUNTER — Ambulatory Visit
Admission: RE | Admit: 2018-11-27 | Discharge: 2018-11-27 | Disposition: A | Discharge status: Home / Self Care | Payer: Medicare Other | Source: Ambulatory Visit | Attending: Internal Medicine | Admitting: Internal Medicine

## 2018-11-27 DIAGNOSIS — I6529 Occlusion and stenosis of unspecified carotid artery: Secondary | ICD-10-CM

## 2018-11-27 DIAGNOSIS — I6523 Occlusion and stenosis of bilateral carotid arteries: Secondary | ICD-10-CM | POA: Diagnosis not present

## 2018-12-10 DIAGNOSIS — R0981 Nasal congestion: Secondary | ICD-10-CM | POA: Diagnosis not present

## 2018-12-10 DIAGNOSIS — H6503 Acute serous otitis media, bilateral: Secondary | ICD-10-CM | POA: Diagnosis not present

## 2018-12-10 DIAGNOSIS — J069 Acute upper respiratory infection, unspecified: Secondary | ICD-10-CM | POA: Diagnosis not present

## 2018-12-12 DIAGNOSIS — I10 Essential (primary) hypertension: Secondary | ICD-10-CM | POA: Diagnosis not present

## 2018-12-12 DIAGNOSIS — J0100 Acute maxillary sinusitis, unspecified: Secondary | ICD-10-CM | POA: Diagnosis not present

## 2018-12-12 DIAGNOSIS — R05 Cough: Secondary | ICD-10-CM | POA: Diagnosis not present

## 2018-12-26 DIAGNOSIS — R0981 Nasal congestion: Secondary | ICD-10-CM | POA: Diagnosis not present

## 2018-12-26 DIAGNOSIS — I10 Essential (primary) hypertension: Secondary | ICD-10-CM | POA: Diagnosis not present

## 2018-12-26 DIAGNOSIS — E782 Mixed hyperlipidemia: Secondary | ICD-10-CM | POA: Diagnosis not present

## 2019-02-08 DIAGNOSIS — J019 Acute sinusitis, unspecified: Secondary | ICD-10-CM | POA: Diagnosis not present

## 2019-02-08 DIAGNOSIS — J309 Allergic rhinitis, unspecified: Secondary | ICD-10-CM | POA: Diagnosis not present

## 2019-02-08 DIAGNOSIS — R05 Cough: Secondary | ICD-10-CM | POA: Diagnosis not present

## 2019-02-11 DIAGNOSIS — J019 Acute sinusitis, unspecified: Secondary | ICD-10-CM | POA: Diagnosis not present

## 2019-04-09 DIAGNOSIS — E782 Mixed hyperlipidemia: Secondary | ICD-10-CM | POA: Diagnosis not present

## 2019-04-09 DIAGNOSIS — I6529 Occlusion and stenosis of unspecified carotid artery: Secondary | ICD-10-CM | POA: Diagnosis not present

## 2019-04-09 DIAGNOSIS — I10 Essential (primary) hypertension: Secondary | ICD-10-CM | POA: Diagnosis not present

## 2019-05-01 DIAGNOSIS — I10 Essential (primary) hypertension: Secondary | ICD-10-CM | POA: Diagnosis not present

## 2019-05-01 DIAGNOSIS — I6529 Occlusion and stenosis of unspecified carotid artery: Secondary | ICD-10-CM | POA: Diagnosis not present

## 2019-05-01 DIAGNOSIS — E782 Mixed hyperlipidemia: Secondary | ICD-10-CM | POA: Diagnosis not present

## 2019-07-21 DIAGNOSIS — R635 Abnormal weight gain: Secondary | ICD-10-CM | POA: Diagnosis not present

## 2019-07-28 ENCOUNTER — Other Ambulatory Visit: Payer: Self-pay

## 2019-08-06 DIAGNOSIS — H524 Presbyopia: Secondary | ICD-10-CM | POA: Diagnosis not present

## 2019-08-06 DIAGNOSIS — H40053 Ocular hypertension, bilateral: Secondary | ICD-10-CM | POA: Diagnosis not present

## 2019-08-06 DIAGNOSIS — H5203 Hypermetropia, bilateral: Secondary | ICD-10-CM | POA: Diagnosis not present

## 2019-08-06 DIAGNOSIS — H40013 Open angle with borderline findings, low risk, bilateral: Secondary | ICD-10-CM | POA: Diagnosis not present

## 2019-08-06 DIAGNOSIS — H52223 Regular astigmatism, bilateral: Secondary | ICD-10-CM | POA: Diagnosis not present

## 2019-09-30 DIAGNOSIS — Z23 Encounter for immunization: Secondary | ICD-10-CM | POA: Diagnosis not present

## 2020-06-25 DIAGNOSIS — E7800 Pure hypercholesterolemia, unspecified: Secondary | ICD-10-CM | POA: Diagnosis not present

## 2020-06-25 DIAGNOSIS — Z0000 Encounter for general adult medical examination without abnormal findings: Secondary | ICD-10-CM | POA: Diagnosis not present

## 2020-06-25 DIAGNOSIS — I10 Essential (primary) hypertension: Secondary | ICD-10-CM | POA: Diagnosis not present

## 2020-06-25 DIAGNOSIS — K219 Gastro-esophageal reflux disease without esophagitis: Secondary | ICD-10-CM | POA: Diagnosis not present

## 2020-11-04 DIAGNOSIS — Z23 Encounter for immunization: Secondary | ICD-10-CM | POA: Diagnosis not present

## 2020-11-24 DIAGNOSIS — Z20822 Contact with and (suspected) exposure to covid-19: Secondary | ICD-10-CM | POA: Diagnosis not present

## 2020-11-24 DIAGNOSIS — J069 Acute upper respiratory infection, unspecified: Secondary | ICD-10-CM | POA: Diagnosis not present

## 2020-11-24 DIAGNOSIS — R051 Acute cough: Secondary | ICD-10-CM | POA: Diagnosis not present

## 2020-12-02 DIAGNOSIS — I10 Essential (primary) hypertension: Secondary | ICD-10-CM | POA: Diagnosis not present

## 2020-12-02 DIAGNOSIS — E7800 Pure hypercholesterolemia, unspecified: Secondary | ICD-10-CM | POA: Diagnosis not present

## 2020-12-23 DIAGNOSIS — R059 Cough, unspecified: Secondary | ICD-10-CM | POA: Diagnosis not present

## 2020-12-23 DIAGNOSIS — U071 COVID-19: Secondary | ICD-10-CM | POA: Diagnosis not present

## 2020-12-30 DIAGNOSIS — I10 Essential (primary) hypertension: Secondary | ICD-10-CM | POA: Diagnosis not present

## 2020-12-30 DIAGNOSIS — Z8616 Personal history of COVID-19: Secondary | ICD-10-CM | POA: Diagnosis not present

## 2021-04-27 ENCOUNTER — Other Ambulatory Visit: Payer: Self-pay

## 2021-04-27 ENCOUNTER — Encounter (HOSPITAL_COMMUNITY): Payer: Self-pay

## 2021-04-27 ENCOUNTER — Emergency Department (HOSPITAL_COMMUNITY)
Admission: EM | Admit: 2021-04-27 | Discharge: 2021-04-27 | Disposition: A | Discharge status: Home / Self Care | Payer: Medicare Other | Attending: Emergency Medicine | Admitting: Emergency Medicine

## 2021-04-27 ENCOUNTER — Emergency Department (HOSPITAL_COMMUNITY): Payer: Medicare Other

## 2021-04-27 DIAGNOSIS — R350 Frequency of micturition: Secondary | ICD-10-CM | POA: Insufficient documentation

## 2021-04-27 DIAGNOSIS — R109 Unspecified abdominal pain: Secondary | ICD-10-CM | POA: Insufficient documentation

## 2021-04-27 DIAGNOSIS — R112 Nausea with vomiting, unspecified: Secondary | ICD-10-CM | POA: Diagnosis not present

## 2021-04-27 DIAGNOSIS — Z7982 Long term (current) use of aspirin: Secondary | ICD-10-CM | POA: Diagnosis not present

## 2021-04-27 DIAGNOSIS — R519 Headache, unspecified: Secondary | ICD-10-CM | POA: Diagnosis not present

## 2021-04-27 DIAGNOSIS — R531 Weakness: Secondary | ICD-10-CM | POA: Insufficient documentation

## 2021-04-27 DIAGNOSIS — H539 Unspecified visual disturbance: Secondary | ICD-10-CM | POA: Diagnosis not present

## 2021-04-27 DIAGNOSIS — R319 Hematuria, unspecified: Secondary | ICD-10-CM | POA: Insufficient documentation

## 2021-04-27 DIAGNOSIS — I10 Essential (primary) hypertension: Secondary | ICD-10-CM | POA: Insufficient documentation

## 2021-04-27 DIAGNOSIS — Z79899 Other long term (current) drug therapy: Secondary | ICD-10-CM | POA: Insufficient documentation

## 2021-04-27 DIAGNOSIS — D72829 Elevated white blood cell count, unspecified: Secondary | ICD-10-CM | POA: Insufficient documentation

## 2021-04-27 DIAGNOSIS — I672 Cerebral atherosclerosis: Secondary | ICD-10-CM | POA: Diagnosis not present

## 2021-04-27 DIAGNOSIS — I6782 Cerebral ischemia: Secondary | ICD-10-CM | POA: Diagnosis not present

## 2021-04-27 DIAGNOSIS — R4701 Aphasia: Secondary | ICD-10-CM | POA: Diagnosis not present

## 2021-04-27 DIAGNOSIS — R1110 Vomiting, unspecified: Secondary | ICD-10-CM | POA: Diagnosis not present

## 2021-04-27 DIAGNOSIS — K219 Gastro-esophageal reflux disease without esophagitis: Secondary | ICD-10-CM | POA: Insufficient documentation

## 2021-04-27 LAB — URINALYSIS, ROUTINE W REFLEX MICROSCOPIC
Bilirubin Urine: NEGATIVE
Glucose, UA: NEGATIVE mg/dL
Ketones, ur: NEGATIVE mg/dL
Nitrite: NEGATIVE
Protein, ur: NEGATIVE mg/dL
Specific Gravity, Urine: 1.014 (ref 1.005–1.030)
pH: 5 (ref 5.0–8.0)

## 2021-04-27 LAB — COMPREHENSIVE METABOLIC PANEL
ALT: 10 U/L (ref 0–44)
AST: 18 U/L (ref 15–41)
Albumin: 4 g/dL (ref 3.5–5.0)
Alkaline Phosphatase: 47 U/L (ref 38–126)
Anion gap: 8 (ref 5–15)
BUN: 23 mg/dL (ref 8–23)
CO2: 25 mmol/L (ref 22–32)
Calcium: 9.6 mg/dL (ref 8.9–10.3)
Chloride: 106 mmol/L (ref 98–111)
Creatinine, Ser: 0.89 mg/dL (ref 0.44–1.00)
GFR, Estimated: >60 mL/min (ref >60)
Glucose, Bld: 104 mg/dL — ABNORMAL HIGH (ref 70–99)
Potassium: 3.9 mmol/L (ref 3.5–5.1)
Sodium: 139 mmol/L (ref 135–145)
Total Bilirubin: 0.7 mg/dL (ref 0.3–1.2)
Total Protein: 7.4 g/dL (ref 6.5–8.1)

## 2021-04-27 LAB — CBC WITH DIFFERENTIAL/PLATELET
Abs Immature Granulocytes: 0.01 10*3/uL (ref 0.00–0.07)
Basophils Absolute: 0.1 10*3/uL (ref 0.0–0.1)
Basophils Relative: 1 %
Eosinophils Absolute: 0.4 10*3/uL (ref 0.0–0.5)
Eosinophils Relative: 6 %
HCT: 40.7 % (ref 36.0–46.0)
Hemoglobin: 13.8 g/dL (ref 12.0–15.0)
Immature Granulocytes: 0 %
Lymphocytes Relative: 23 %
Lymphs Abs: 1.7 10*3/uL (ref 0.7–4.0)
MCH: 30.6 pg (ref 26.0–34.0)
MCHC: 33.9 g/dL (ref 30.0–36.0)
MCV: 90.2 fL (ref 80.0–100.0)
Monocytes Absolute: 0.7 10*3/uL (ref 0.1–1.0)
Monocytes Relative: 9 %
Neutro Abs: 4.4 10*3/uL (ref 1.7–7.7)
Neutrophils Relative %: 61 %
Platelets: 281 10*3/uL (ref 150–400)
RBC: 4.51 MIL/uL (ref 3.87–5.11)
RDW: 13.2 % (ref 11.5–15.5)
WBC: 7.2 10*3/uL (ref 4.0–10.5)
nRBC: 0 % (ref 0.0–0.2)

## 2021-04-27 LAB — LIPASE, BLOOD: Lipase: 39 U/L (ref 11–51)

## 2021-04-27 MED ORDER — IOHEXOL 300 MG/ML  SOLN
100.0000 mL | Freq: Once | INTRAMUSCULAR | Status: AC | PRN
Start: 2021-04-27 — End: 2021-04-27
  Administered 2021-04-27: 100 mL via INTRAVENOUS

## 2021-04-27 MED ORDER — ONDANSETRON 4 MG PO TBDP: 4.0000 mg | ORAL_TABLET | Freq: Three times a day (TID) | ORAL | 0 refills | Status: DC | PRN

## 2021-04-27 NOTE — ED Notes (Signed)
Patient back from CT at this time

## 2021-04-27 NOTE — ED Triage Notes (Signed)
Pt reports HTN and vomiting since last night. Highest reading at home was 198/100. Pt reports not missing any doses of bp meds. Denies headache

## 2021-04-27 NOTE — ED Provider Notes (Signed)
[facility] DEPT Provider Note   CSN: [id number] Arrival date & time: 04/27/21  1153     History Chief Complaint  Patient presents with  . Hypertension  . Emesis    Tina Ellis is a 80 y.o. female with a hx of GERD, hypertension, hypercholesterolemia, & TIA who presents to the ED with complaints of generally not feeling well since last night.  Patient states that she felt generally weak, not very well, and developed some nausea.  She checked her blood pressure at home and it was 198/100, she subsequently vomited, then went to the bathroom and had a bowel movement.  She has had some intermittent sharp abdominal pains prior to having bowel movements as well as some increased urinary frequency recently.  No significant alleviating or aggravating factors.  She mentions that a couple of days ago when she went to pick her grandson up from school she could not come up with his name quickly which is atypical for her, she has not had any other further memory issues. She denies fever, chills, chest pain, shortness of breath, numbness, weakness, visual disturbance, melena, hematochezia, hematemesis, or dysuria.  HPI     Past Medical History:  Diagnosis Date  . GERD (gastroesophageal reflux disease)   . Hypercholesteremia   . Hypertension   . Seasonal allergies   . TIA (transient ischemic attack)     Patient Active Problem List   Diagnosis Date Noted  . Elevated troponin 04/09/2017  . TIA (transient ischemic attack) 04/08/2017  . Abdominal pain   . Stroke-like symptom 04/06/2017  . HTN (hypertension) 04/06/2017  . GERD (gastroesophageal reflux disease) 04/06/2017  . Influenza A 12/15/2013  . Hypotension 12/14/2013  . Acute renal failure ([place]) 12/14/2013  . UTI (lower urinary tract infection) 12/13/2013  . Hypotension, unspecified 12/13/2013  . Dehydration 12/13/2013    Past Surgical History:  Procedure Laterality Date  . BACK SURGERY     x 2  .  CHOLECYSTECTOMY    . PARTIAL HYSTERECTOMY       OB History   No obstetric history on file.     Family History  Problem Relation Age of Onset  . Colon cancer Mother   . Lung cancer Father   . Crohn's disease Brother     Social History   Tobacco Use  . Smoking status: Never Smoker  . Smokeless tobacco: Never Used  Vaping Use  . Vaping Use: Never used  Substance Use Topics  . Alcohol use: No  . Drug use: No    Home Medications Prior to Admission medications   Medication Sig Start Date End Date Taking? Authorizing Provider  aspirin EC 325 MG tablet Take 1 tablet (325 mg total) by mouth daily. 04/10/17   Rosita Fire, MD  carvedilol (COREG) 12.5 MG tablet Take 1 tablet (12.5 mg total) by mouth 2 (two) times daily with a meal. Patient taking differently: Take 3.125 mg by mouth 2 (two) times daily with a meal.  04/10/17   Rosita Fire, MD  cetirizine (ZYRTEC) 10 MG tablet Take 10 mg by mouth at bedtime.    [provider]  cholecalciferol (VITAMIN D) 1000 UNITS tablet Take 2,000 Units by mouth every morning.    [provider]  losartan (COZAAR) 100 MG tablet Take 100 mg by mouth every morning.    [provider]  omeprazole (PRILOSEC) 20 MG capsule Take 20 mg by mouth every morning.     [provider]  rosuvastatin (CRESTOR) 10 MG tablet Take 1 tablet (10 mg total) by mouth every evening. 04/10/17   Rosita Fire, MD  spironolactone (ALDACTONE) 25 MG tablet Take 25 mg by mouth daily. 03/04/18   [provider]    Allergies    Patient has no known allergies.  Review of Systems   Review of Systems  Constitutional: Negative for chills and fever.  Respiratory: Negative for cough and shortness of breath.   Cardiovascular: Negative for chest pain.  Gastrointestinal: Positive for abdominal pain, nausea and vomiting. Negative for blood in stool, constipation and diarrhea.  Genitourinary: Positive for frequency.  Negative for dysuria.  Neurological: Negative for dizziness, syncope, weakness, numbness and headaches.  All other systems reviewed and are negative.   Physical Exam Updated Vital Signs BP (!) 141/86 (BP Location: Left Arm)   Pulse 85   Temp 98.5 F (36.9 C) (Oral)   Resp 14   Ht 5\' 6"  (1.676 m)   Wt 68.9 kg   SpO2 100%   BMI 24.53 kg/m   Physical Exam Vitals and nursing note reviewed.  Constitutional:      General: She is not in acute distress.    Appearance: She is well-developed. She is not toxic-appearing.  HENT:     Head: Normocephalic and atraumatic.  Eyes:     General:        Right eye: No discharge.        Left eye: No discharge.     Conjunctiva/sclera: Conjunctivae normal.  Cardiovascular:     Rate and Rhythm: Normal rate and regular rhythm.  Pulmonary:     Effort: Pulmonary effort is normal. No respiratory distress.     Breath sounds: Normal breath sounds. No wheezing, rhonchi or rales.  Abdominal:     General: There is no distension.     Palpations: Abdomen is soft.     Tenderness: There is no abdominal tenderness. There is no right CVA tenderness, left CVA tenderness, guarding or rebound.  Musculoskeletal:     Cervical back: Neck supple.  Skin:    General: Skin is warm and dry.     Findings: No rash.  Neurological:     Mental Status: She is alert.     Comments: Clear speech.  CN II through XII grossly intact.  Sensation grossly intact bilateral upper and lower extremities.  5 out of 5 symmetric grip strength.  5 out of 5 strength with plantar dorsiflexion bilaterally.  Intact finger-nose.  Negative pronator drift.  The patient is ambulatory.  Psychiatric:        Behavior: Behavior normal.    ED Results / Procedures / Treatments   Labs (all labs ordered are listed, but only abnormal results are displayed) Labs Reviewed  COMPREHENSIVE METABOLIC PANEL - Abnormal; Notable for the following components:      Result Value   Glucose, Bld 104 (*)    All  other components within normal limits  URINALYSIS, ROUTINE W REFLEX MICROSCOPIC - Abnormal; Notable for the following components:   Hgb urine dipstick MODERATE (*)    Leukocytes,Ua TRACE (*)    Bacteria, UA RARE (*)    All other components within normal limits  URINE CULTURE  CBC WITH DIFFERENTIAL/PLATELET  LIPASE, BLOOD    EKG None  Radiology CT Head Wo Contrast  Result Date: 04/27/2021 CLINICAL DATA:  Nausea and vomiting.  History of hypertension. EXAM: CT HEAD WITHOUT CONTRAST TECHNIQUE: Contiguous axial images were obtained from the base of the skull through  the vertex without intravenous contrast. COMPARISON:  CT head 04/06/2017. FINDINGS: Brain: There is no evidence of acute intracranial hemorrhage, mass lesion, brain edema or extra-axial fluid collection. The ventricles and subarachnoid spaces are appropriately sized for age. There is no CT evidence of acute cortical infarction. Patchy low-density in the periventricular white matter remains most consistent with chronic small vessel ischemic changes and has not significantly progressed. Vascular: Intracranial vascular calcifications. No hyperdense vessel identified. Skull: Negative for fracture or focal lesion. Sinuses/Orbits: The visualized paranasal sinuses and mastoid air cells are clear. No orbital abnormalities are seen. Other: None. IMPRESSION: 1. Stable chronic small vessel ischemic changes. 2. No acute intracranial findings. Electronically Signed   By: Richardean Sale M.D.   On: 04/27/2021 15:10   CT Abdomen Pelvis W Contrast  Result Date: 04/27/2021 CLINICAL DATA:  Hypertension and vomiting.  Abdominal pain. EXAM: CT ABDOMEN AND PELVIS WITH CONTRAST TECHNIQUE: Multidetector CT imaging of the abdomen and pelvis was performed using the standard protocol following bolus administration of intravenous contrast. CONTRAST:  157mL OMNIPAQUE IOHEXOL 300 MG/ML  SOLN COMPARISON:  04/08/2017 FINDINGS: Lower chest: Unremarkable Hepatobiliary:  Cholecystectomy. Common bile duct 1.0 cm in diameter, previously the same, possibly a physiologic response to cholecystectomy. Pancreas: Unremarkable Spleen: Unremarkable Adrenals/Urinary Tract: Unremarkable Stomach/Bowel: Nondistended stomach with mildly accentuated rugal fold thickness but not appreciably changed from 04/08/2017, some of this prominence may simply be from nondistention. Sigmoid colon diverticulosis without active diverticulitis. Normal appendix. No dilated small bowel. Vascular/Lymphatic: Aortoiliac atherosclerotic vascular disease. No pathologic adenopathy. Reproductive: Uterus absent.  Adnexa unremarkable. Other: No supplemental non-categorized findings. Musculoskeletal: Grade 1 degenerative anterolisthesis at L4-5. Lumbar spondylosis and degenerative disc disease causing mild left foraminal stenosis at L3-4 and L4-5. IMPRESSION: 1. No acute findings to explain the patient's abdominal pain. There is sigmoid diverticulosis but no current active diverticulitis. 2. Stable prominence of the rugal folds in the proximal stomach, probably mainly from nondistention, and similar to 04/08/2017. Gastritis not excluded. 3.  Aortic Atherosclerosis (ICD10-I70.0). 4. Mild impingement at L3-4 and L4-5. Electronically Signed   By: Van Clines M.D.   On: 04/27/2021 15:08    Procedures Procedures   Medications Ordered in ED Medications - No data to display  ED Course  I have reviewed the triage vital signs and the nursing notes.  Pertinent labs & imaging results that were available during my care of the patient were reviewed by me and considered in my medical decision making (see chart for details).    MDM Rules/Calculators/A&P                         Patient presents to the ED with complaints of generally not feeling well last night with vomiting and elevated blood pressure.  Additional history obtained:  Additional history obtained from chart review & nursing note review.   Lab  Tests:  I Ordered, reviewed, and interpreted labs, which included:  CBC, CMP, lipase, urinalysis: Fairly unremarkable, hematuria present with trace leukocytes, sent for culture given patient's urinary frequency.  Imaging Studies ordered:  I ordered imaging studies which included CT head & CT A/P, I independently reviewed, formal radiology impression shows:  CT head: 1. Stable chronic small vessel ischemic changes. 2. No acute intracranial findings CT A/p: 1. No acute findings to explain the patient's abdominal pain. There is sigmoid diverticulosis but no current active diverticulitis. 2. Stable prominence of the rugal folds in the proximal stomach, probably mainly from nondistention, and similar to 04/08/2017.  Gastritis not excluded. 3.  Aortic Atherosclerosis (ICD10-I70.0). 4. Mild impingement at L3-4 and L4-5.  ED Course:  Overall reassuring work-up in the emergency department.  No significant electrolyte derangement or acute renal failure.  Urinalysis without obvious UTI, urine culture sent given patient has had some urinary frequency.  Her CT head did not show acute process, she only had 1 very brief moment where she could not remember her grandsons name, has not had any other memory issues, she has no focal neurologic deficits on exam.  Abdomen is nontender without peritoneal signs, CT A/P overall reassuring from an acute standpoint, she is tolerating p.o. in the emergency department. Vitals when repeated w/ normal BP.   Blood pressure 123/81, pulse 75, temperature 98.5 F (36.9 C), temperature source Oral, resp. rate 13, height 5\' 6"  (1.676 m), weight 68.9 kg, SpO2 100 %.  Reassuring emergency department work-up.  Patient overall appears appropriate for discharge home at this time. We discussed results, treatment plan, need for follow-up, and return precautions with the patient. Provided opportunity for questions, patient confirmed understanding and is in agreement with plan.    This is a  shared visit with supervising physician Dr. Roslynn Amble who has independently evaluated patient & provided guidance in evaluation/management/disposition, in agreement with care   Portions of this note were generated with Dragon dictation software. Dictation errors may occur despite best attempts at proofreading.  Final Clinical Impression(s) / ED Diagnoses Final diagnoses:  Non-intractable vomiting with nausea, unspecified vomiting type    Rx / DC Orders ED Discharge Orders         Ordered    ondansetron (ZOFRAN ODT) 4 MG disintegrating tablet  Every 8 hours PRN        04/27/21 1518           Gillian Kluever, [place] R, PA-C 04/27/21 1518    Lucrezia Starch, MD 04/28/21 [phone]

## 2021-04-27 NOTE — ED Notes (Signed)
Patient transported to CT 

## 2021-04-27 NOTE — ED Notes (Signed)
Family to bedside at this time.

## 2021-04-27 NOTE — Discharge Instructions (Addendum)
You were seen in the emergency department today for vomiting with high blood pressure last night.  Your work-up was overall reassuring.  Your blood work did not show any significant abnormalities, your urine did have a little bit of blood in it, we have sent for culture and we will call you if the culture is abnormal, please discuss the blood in your urine with your primary care provider in follow-up.  Your head CT did not show any significant abnormalities.  Your CT of your abdomen/pelvis did not show any significant new abnormal findings.  We are sending you home with Zofran to take every 8 hours as needed for nausea and vomiting.  We have prescribed you new medication(s) today. Discuss the medications prescribed today with your pharmacist as they can have adverse effects and interactions with your other medicines including over the counter and prescribed medications. Seek medical evaluation if you start to experience new or abnormal symptoms after taking one of these medicines, seek care immediately if you start to experience difficulty breathing, feeling of your throat closing, facial swelling, or rash as these could be indications of a more serious allergic reaction  Please follow-up with primary care within 3 days.  Please continue to take your blood pressure medication at home.  Return to the ER for new or worsening symptoms including but not limited to increased or new pain, fever, inability to keep fluids down, blood in vomit or stool, passing out, numbness/weakness to a certain side of your body, speech abnormality, facial droop, memory problems, or any other concerns.

## 2021-04-29 LAB — URINE CULTURE

## 2021-05-31 DIAGNOSIS — E7800 Pure hypercholesterolemia, unspecified: Secondary | ICD-10-CM | POA: Diagnosis not present

## 2021-05-31 DIAGNOSIS — I6529 Occlusion and stenosis of unspecified carotid artery: Secondary | ICD-10-CM | POA: Diagnosis not present

## 2021-05-31 DIAGNOSIS — N6321 Unspecified lump in the left breast, upper outer quadrant: Secondary | ICD-10-CM | POA: Diagnosis not present

## 2021-05-31 DIAGNOSIS — I10 Essential (primary) hypertension: Secondary | ICD-10-CM | POA: Diagnosis not present

## 2021-05-31 DIAGNOSIS — I700 Atherosclerosis of aorta: Secondary | ICD-10-CM | POA: Diagnosis not present

## 2021-06-06 DIAGNOSIS — N6321 Unspecified lump in the left breast, upper outer quadrant: Secondary | ICD-10-CM | POA: Diagnosis not present

## 2021-06-23 ENCOUNTER — Other Ambulatory Visit: Payer: Self-pay | Admitting: Hematology and Oncology

## 2021-06-23 DIAGNOSIS — C773 Secondary and unspecified malignant neoplasm of axilla and upper limb lymph nodes: Secondary | ICD-10-CM | POA: Diagnosis not present

## 2021-06-23 DIAGNOSIS — N6321 Unspecified lump in the left breast, upper outer quadrant: Secondary | ICD-10-CM | POA: Diagnosis not present

## 2021-06-23 DIAGNOSIS — C50412 Malignant neoplasm of upper-outer quadrant of left female breast: Secondary | ICD-10-CM | POA: Diagnosis not present

## 2021-06-24 DIAGNOSIS — C801 Malignant (primary) neoplasm, unspecified: Secondary | ICD-10-CM

## 2021-06-24 HISTORY — DX: Malignant (primary) neoplasm, unspecified: C80.1

## 2021-07-05 DIAGNOSIS — C773 Secondary and unspecified malignant neoplasm of axilla and upper limb lymph nodes: Secondary | ICD-10-CM | POA: Diagnosis not present

## 2021-07-05 DIAGNOSIS — C50912 Malignant neoplasm of unspecified site of left female breast: Secondary | ICD-10-CM | POA: Diagnosis not present

## 2021-07-07 DIAGNOSIS — C773 Secondary and unspecified malignant neoplasm of axilla and upper limb lymph nodes: Secondary | ICD-10-CM | POA: Diagnosis not present

## 2021-07-07 DIAGNOSIS — C50912 Malignant neoplasm of unspecified site of left female breast: Secondary | ICD-10-CM | POA: Diagnosis not present

## 2021-07-08 ENCOUNTER — Telehealth: Payer: Self-pay | Admitting: Hematology and Oncology

## 2021-07-08 ENCOUNTER — Encounter: Payer: Self-pay | Admitting: *Deleted

## 2021-07-08 ENCOUNTER — Telehealth: Payer: Self-pay | Admitting: *Deleted

## 2021-07-08 NOTE — Telephone Encounter (Signed)
Received referral for pt to see med onc from [facility]. Informed pt can be seen on 7/19 at 3:45 to see Dr. Lindi Adie. MR in care everywhere.

## 2021-07-08 NOTE — Telephone Encounter (Signed)
Received a new pt referral from [facility] for dx of breast cancer. I cld Tina Ellis and scheduled her to see Dr. Lindi Adie on 7/19 at 345pm. Pt aware to arrive 15 minutes early.

## 2021-07-11 DIAGNOSIS — Z01818 Encounter for other preprocedural examination: Secondary | ICD-10-CM | POA: Diagnosis not present

## 2021-07-11 DIAGNOSIS — I10 Essential (primary) hypertension: Secondary | ICD-10-CM | POA: Diagnosis not present

## 2021-07-11 DIAGNOSIS — E7800 Pure hypercholesterolemia, unspecified: Secondary | ICD-10-CM | POA: Diagnosis not present

## 2021-07-11 DIAGNOSIS — Z79899 Other long term (current) drug therapy: Secondary | ICD-10-CM | POA: Diagnosis not present

## 2021-07-11 DIAGNOSIS — C50919 Malignant neoplasm of unspecified site of unspecified female breast: Secondary | ICD-10-CM | POA: Diagnosis not present

## 2021-07-11 NOTE — Progress Notes (Signed)
[name] CONSULT NOTE  Patient Care Team: [name], MD as PCP - General (Internal Medicine) Mauro Kaufmann, RN as Oncology Nurse Navigator Rockwell Germany, RN as Oncology Nurse Navigator Nicholas Lose, MD as Consulting Physician (Hematology and Oncology)  CHIEF COMPLAINTS/PURPOSE OF CONSULTATION:  Newly diagnosed left breast cancer  HISTORY OF PRESENTING ILLNESS:  Tina Ellis 80 y.o. female is here because of recent diagnosis of invasive ductal carcinoma of the left breast. She palpated a lump in in the left axilla. Diagnostic mammogram and [place] on 06/06/21 showed a suspicious mass in the upper outer left breast, a probable enlarged lymph node in the left axilla, and no evidence of malignancy in the right breast. Biopsy on 06/23/21 showed poorly differentiated high-grade carcinoma in the left upper outer breast mass and the left axillary lymph node was positive for carcinoma and lymphovascular invasion but negative for lymphoid tissue. She presents to the clinic today for initial evaluation and discussion of treatment options.   I reviewed her records extensively and collaborated the history with the patient.  SUMMARY OF ONCOLOGIC HISTORY: Oncology History  Malignant neoplasm of upper-outer quadrant of left breast in female, estrogen receptor negative ([place])  06/23/2021 Initial Diagnosis   Patient felt left axillary lump which led to mammogram and ultrasound that revealed 4.1 cm left breast mass with a 6.7 cm left axillary lymph node.  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  Lymphovascular invasion noted, ER 0%, PR 0%, HER2 negative     MEDICAL HISTORY:  Past Medical History:  Diagnosis Date   GERD (gastroesophageal reflux disease)    Hypercholesteremia    Hypertension    Seasonal allergies    TIA (transient ischemic attack)     SURGICAL HISTORY: Past Surgical History:  Procedure Laterality Date    BACK SURGERY     x 2   CHOLECYSTECTOMY     PARTIAL HYSTERECTOMY      SOCIAL HISTORY: Social History   Socioeconomic History   Marital status: Married    Spouse name: Not on file   Number of children: 2   Years of education: HS   Highest education level: Not on file  Occupational History   Occupation: Part-time Press photographer  Tobacco Use   Smoking status: Never   Smokeless tobacco: Never  Vaping Use   Vaping Use: Never used  Substance and Sexual Activity   Alcohol use: No   Drug use: No   Sexual activity: Not Currently  Other Topics Concern   Not on file  Social History Narrative   Lives at home with son and grandson.   Right-handed.   1-2 cups caffeine per day.   Social Determinants of Health   Financial Resource Strain: Not on file  Food Insecurity: Not on file  Transportation Needs: Not on file  Physical Activity: Not on file  Stress: Not on file  Social Connections: Not on file  Intimate Partner Violence: Not on file    FAMILY HISTORY: Family History  Problem Relation Age of Onset   Colon cancer Mother    Lung cancer Father    Crohn's disease Brother     ALLERGIES:  has No Known Allergies.  MEDICATIONS:  Current Outpatient Medications  Medication Sig Dispense Refill   aspirin EC 325 MG tablet Take 1 tablet (325 mg total) by mouth daily. 30 tablet 0   carvedilol (COREG) 12.5 MG tablet Take 1 tablet (12.5 mg total) by mouth 2 (two)  times daily with a meal. (Patient taking differently: Take 3.125 mg by mouth 2 (two) times daily with a meal. ) 60 tablet 0   cetirizine (ZYRTEC) 10 MG tablet Take 10 mg by mouth at bedtime.     cholecalciferol (VITAMIN D) 1000 UNITS tablet Take 2,000 Units by mouth every morning.     losartan (COZAAR) 100 MG tablet Take 100 mg by mouth every morning.     omeprazole (PRILOSEC) 20 MG capsule Take 20 mg by mouth every morning.      ondansetron (ZOFRAN ODT) 4 MG disintegrating tablet Take 1 tablet (4 mg total) by mouth every 8 (eight)  hours as needed for nausea or vomiting. 5 tablet 0   rosuvastatin (CRESTOR) 10 MG tablet Take 1 tablet (10 mg total) by mouth every evening. 30 tablet 0   spironolactone (ALDACTONE) 25 MG tablet Take 25 mg by mouth daily.     No current facility-administered medications for this visit.    REVIEW OF SYSTEMS:   Constitutional: Denies fevers, chills or abnormal night sweats Eyes: Denies blurriness of vision, double vision or watery eyes Ears, nose, mouth, throat, and face: Denies mucositis or sore throat Respiratory: Denies cough, dyspnea or wheezes Cardiovascular: Denies palpitation, chest discomfort or lower extremity swelling Gastrointestinal:  Denies nausea, heartburn or change in bowel habits Skin: Denies abnormal skin rashes Lymphatics: Denies new lymphadenopathy or easy bruising Neurological:Denies numbness, tingling or new weaknesses Behavioral/Psych: Mood is stable, no new changes  Breast: large palpable mass in axilla left All other systems were reviewed with the patient and are negative.  PHYSICAL EXAMINATION: ECOG PERFORMANCE STATUS: 1 - Symptomatic but completely ambulatory  Vitals:   07/12/21 1532  BP: (!) 147/53  Pulse: 66  Resp: 17  Temp: 98 F (36.7 C)  SpO2: 100%   Filed Weights   07/12/21 1532  Weight: 148 lb (67.1 kg)    GENERAL:alert, no distress and comfortable SKIN: skin color, texture, turgor are normal, no rashes or significant lesions EYES: normal, conjunctiva are pink and non-injected, sclera clear OROPHARYNX:no exudate, no erythema and lips, buccal mucosa, and tongue normal  NECK: supple, thyroid normal size, non-tender, without nodularity LYMPH:  no palpable lymphadenopathy in the cervical, axillary or inguinal LUNGS: clear to auscultation and percussion with normal breathing effort HEART: regular rate & rhythm and no murmurs and no lower extremity edema ABDOMEN:abdomen soft, non-tender and normal bowel sounds Musculoskeletal:no cyanosis of  digits and no clubbing  PSYCH: alert & oriented x 3 with fluent speech NEURO: no focal motor/sensory deficits BREAST:Large palpable left axillary mass (exam performed in the presence of a chaperone)   LABORATORY DATA:  I have reviewed the data as listed Lab Results  Component Value Date   WBC 7.2 04/27/2021   HGB 13.8 04/27/2021   HCT 40.7 04/27/2021   MCV 90.2 04/27/2021   PLT 281 04/27/2021   Lab Results  Component Value Date   NA 139 04/27/2021   K 3.9 04/27/2021   CL 106 04/27/2021   CO2 25 04/27/2021    RADIOGRAPHIC STUDIES: I have personally reviewed the radiological reports and agreed with the findings in the report.  ASSESSMENT AND PLAN:  Malignant neoplasm of upper-outer quadrant of left breast in female, estrogen receptor negative ([place]) 06/23/2021: Patient felt left axillary lump which led to mammogram and ultrasound that revealed 4.1 cm left breast mass with a 6.7 cm left axillary lymph node.  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  Lymphovascular  invasion noted, ER 0%, PR 0%, HER2 negative  Pathology and radiology counseling: Discussed with the patient, the details of pathology including the type of breast cancer,the clinical staging, the significance of ER, PR and HER-2/neu receptors and the implications for treatment. After reviewing the pathology in detail, we proceeded to discuss the different treatment options between surgery, radiation, chemotherapy, antiestrogen therapies.  Recommendation 1.  Neoadjuvant chemotherapy and immunotherapy with Taxol Doristine Church followed by Adriamycin Cytoxan Keytruda based on keynote 522 clinical trial  2. followed by surgery with lumpectomy with ALN D 3.  Adjuvant radiation therapy 4.  Keytruda maintenance for 1 year Genetic testing will need to be done.  CT abdomen pelvis 04/27/2021: No evidence of metastatic disease. CT head 04/27/2021: Performed for dizziness: Stable  chronic small vessel ischemic changes She does need CT chest and a bone scan for completion. Patient saw [facility]. Dannielle Huh at [facility]. She wants to get treated here in [place] but undergo surgery at [facility], echocardiogram, chemo class will need to be arranged. Start chemo next week   All questions were answered. The patient knows to call the clinic with any problems, questions or concerns.   Rulon Eisenmenger, MD, MPH 07/12/2021    I, Thana Ates, am acting as scribe for Nicholas Lose, MD.  I have reviewed the above documentation for accuracy and completeness, and I agree with the above.

## 2021-07-12 ENCOUNTER — Inpatient Hospital Stay: Payer: Medicare Other | Attending: Hematology and Oncology | Admitting: Hematology and Oncology

## 2021-07-12 ENCOUNTER — Other Ambulatory Visit: Payer: Self-pay | Admitting: *Deleted

## 2021-07-12 ENCOUNTER — Other Ambulatory Visit: Payer: Medicare Other

## 2021-07-12 ENCOUNTER — Encounter: Payer: Self-pay | Admitting: *Deleted

## 2021-07-12 ENCOUNTER — Other Ambulatory Visit: Payer: Self-pay

## 2021-07-12 VITALS — BP 147/53 | HR 66 | Temp 98.0°F | Resp 17 | Ht 66.0 in | Wt 148.0 lb

## 2021-07-12 DIAGNOSIS — C50412 Malignant neoplasm of upper-outer quadrant of left female breast: Secondary | ICD-10-CM

## 2021-07-12 DIAGNOSIS — Z5112 Encounter for antineoplastic immunotherapy: Secondary | ICD-10-CM | POA: Diagnosis not present

## 2021-07-12 DIAGNOSIS — Z8673 Personal history of transient ischemic attack (TIA), and cerebral infarction without residual deficits: Secondary | ICD-10-CM

## 2021-07-12 DIAGNOSIS — Z79899 Other long term (current) drug therapy: Secondary | ICD-10-CM | POA: Diagnosis not present

## 2021-07-12 DIAGNOSIS — Z171 Estrogen receptor negative status [ER-]: Secondary | ICD-10-CM | POA: Diagnosis not present

## 2021-07-12 DIAGNOSIS — C773 Secondary and unspecified malignant neoplasm of axilla and upper limb lymph nodes: Secondary | ICD-10-CM | POA: Diagnosis not present

## 2021-07-12 DIAGNOSIS — Z5111 Encounter for antineoplastic chemotherapy: Secondary | ICD-10-CM | POA: Diagnosis not present

## 2021-07-12 MED ORDER — PROCHLORPERAZINE MALEATE 10 MG PO TABS: 10.0000 mg | ORAL_TABLET | Freq: Four times a day (QID) | ORAL | 1 refills | Status: DC | PRN

## 2021-07-12 MED ORDER — DEXAMETHASONE 4 MG PO TABS: 4.0000 mg | ORAL_TABLET | Freq: Every day | ORAL | 1 refills | Status: DC

## 2021-07-12 MED ORDER — ONDANSETRON HCL 8 MG PO TABS: 8.0000 mg | ORAL_TABLET | Freq: Two times a day (BID) | ORAL | 1 refills | Status: DC | PRN

## 2021-07-12 MED ORDER — LIDOCAINE-PRILOCAINE 2.5-2.5 % EX CREA: TOPICAL_CREAM | CUTANEOUS | 3 refills | Status: DC

## 2021-07-12 MED ORDER — ASPIRIN 81 MG PO TBEC: 81.0000 mg | DELAYED_RELEASE_TABLET | Freq: Every day | ORAL | Status: AC

## 2021-07-12 NOTE — Progress Notes (Signed)
START ON PATHWAY REGIMEN - Breast     Cycles 1 through 4: A cycle is every 21 days:     Pembrolizumab      Paclitaxel      Carboplatin      Filgrastim-xxxx    Cycles 5 through 8: A cycle is every 21 days:     Pembrolizumab      Doxorubicin      Cyclophosphamide      Pegfilgrastim-xxxx   **Always confirm dose/schedule in your pharmacy ordering system**  Patient Characteristics: Preoperative or Nonsurgical Candidate (Clinical Staging), Neoadjuvant Therapy followed by Surgery, Invasive Disease, Chemotherapy, HER2 Negative/Unknown/Equivocal, ER Negative/Unknown, Platinum Therapy Indicated and Candidate for Checkpoint Inhibitor Therapeutic Status: Preoperative or Nonsurgical Candidate (Clinical Staging) AJCC M Category: cM0 AJCC Grade: G3 Breast Surgical Plan: Neoadjuvant Therapy followed by Surgery ER Status: Negative (-) AJCC 8 Stage Grouping: IIIC HER2 Status: Negative (-) AJCC T Category: cT2 AJCC N Category: cN2 PR Status: Negative (-) Intent of Therapy: Curative Intent, Discussed with Patient

## 2021-07-12 NOTE — Assessment & Plan Note (Signed)
06/23/2021: Patient felt left axillary lump which led to mammogram and ultrasound that revealed 4.1 cm left breast mass with a 6.7 cm left axillary lymph node.  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  Lymphovascular invasion noted, ER 0%, PR 0%, HER2 negative  Pathology and radiology counseling: Discussed with the patient, the details of pathology including the type of breast cancer,the clinical staging, the significance of ER, PR and HER-2/neu receptors and the implications for treatment. After reviewing the pathology in detail, we proceeded to discuss the different treatment options between surgery, radiation, chemotherapy, antiestrogen therapies.  Recommendation 1.  Neoadjuvant chemotherapy and immunotherapy with Taxol Doristine Church followed by Adriamycin Cytoxan Keytruda based on keynote 522 clinical trial  2. followed by surgery with lumpectomy with ALN D 3.  Adjuvant radiation therapy 4.  Keytruda maintenance for 1 year Genetic testing will need to be done.  CT abdomen pelvis 04/27/2021: No evidence of metastatic disease. CT head 04/27/2021: Performed for dizziness: Stable chronic small vessel ischemic changes She does need CT chest and a bone scan for completion. Patient saw Dr. Dannielle Huh at [facility].  Port, echocardiogram, chemo class will need to be arranged.

## 2021-07-13 DIAGNOSIS — Z171 Estrogen receptor negative status [ER-]: Secondary | ICD-10-CM | POA: Diagnosis not present

## 2021-07-13 DIAGNOSIS — C50912 Malignant neoplasm of unspecified site of left female breast: Secondary | ICD-10-CM | POA: Diagnosis not present

## 2021-07-13 DIAGNOSIS — C773 Secondary and unspecified malignant neoplasm of axilla and upper limb lymph nodes: Secondary | ICD-10-CM | POA: Diagnosis not present

## 2021-07-13 DIAGNOSIS — Z887 Allergy status to serum and vaccine status: Secondary | ICD-10-CM | POA: Diagnosis not present

## 2021-07-13 DIAGNOSIS — Z8673 Personal history of transient ischemic attack (TIA), and cerebral infarction without residual deficits: Secondary | ICD-10-CM | POA: Diagnosis not present

## 2021-07-13 DIAGNOSIS — J984 Other disorders of lung: Secondary | ICD-10-CM | POA: Diagnosis not present

## 2021-07-13 DIAGNOSIS — Z888 Allergy status to other drugs, medicaments and biological substances status: Secondary | ICD-10-CM | POA: Diagnosis not present

## 2021-07-13 DIAGNOSIS — Z90710 Acquired absence of both cervix and uterus: Secondary | ICD-10-CM | POA: Diagnosis not present

## 2021-07-13 DIAGNOSIS — Z800 Family history of malignant neoplasm of digestive organs: Secondary | ICD-10-CM | POA: Diagnosis not present

## 2021-07-13 DIAGNOSIS — C50412 Malignant neoplasm of upper-outer quadrant of left female breast: Secondary | ICD-10-CM | POA: Diagnosis not present

## 2021-07-13 DIAGNOSIS — I10 Essential (primary) hypertension: Secondary | ICD-10-CM | POA: Diagnosis not present

## 2021-07-13 DIAGNOSIS — Z803 Family history of malignant neoplasm of breast: Secondary | ICD-10-CM | POA: Diagnosis not present

## 2021-07-13 DIAGNOSIS — E785 Hyperlipidemia, unspecified: Secondary | ICD-10-CM | POA: Diagnosis not present

## 2021-07-13 DIAGNOSIS — Z452 Encounter for adjustment and management of vascular access device: Secondary | ICD-10-CM | POA: Diagnosis not present

## 2021-07-13 DIAGNOSIS — Z79899 Other long term (current) drug therapy: Secondary | ICD-10-CM | POA: Diagnosis not present

## 2021-07-13 DIAGNOSIS — Z7982 Long term (current) use of aspirin: Secondary | ICD-10-CM | POA: Diagnosis not present

## 2021-07-13 DIAGNOSIS — J939 Pneumothorax, unspecified: Secondary | ICD-10-CM | POA: Diagnosis not present

## 2021-07-13 DIAGNOSIS — Z801 Family history of malignant neoplasm of trachea, bronchus and lung: Secondary | ICD-10-CM | POA: Diagnosis not present

## 2021-07-13 DIAGNOSIS — Z9049 Acquired absence of other specified parts of digestive tract: Secondary | ICD-10-CM | POA: Diagnosis not present

## 2021-07-14 ENCOUNTER — Encounter: Payer: Self-pay | Admitting: *Deleted

## 2021-07-14 ENCOUNTER — Other Ambulatory Visit: Payer: Self-pay | Admitting: Licensed Clinical Social Worker

## 2021-07-14 ENCOUNTER — Telehealth: Payer: Self-pay

## 2021-07-14 ENCOUNTER — Telehealth: Payer: Self-pay | Admitting: *Deleted

## 2021-07-14 DIAGNOSIS — C50412 Malignant neoplasm of upper-outer quadrant of left female breast: Secondary | ICD-10-CM

## 2021-07-14 NOTE — Telephone Encounter (Signed)
Spoke with patient to inform her that her CT chest has been moved up to 7/22 at 415.  Instructions given. Also gave her appt for MRI breast for 7/27 at 10am at [facility]. Gave information to get her mychart account opened.  Encouraged her to call should she have any questions at all. Patient verbalized understanding.

## 2021-07-14 NOTE — Telephone Encounter (Signed)
Spoke with patient to follow from her appt with dr. Lindi Adie and assess navigation needs.  She tells me when she had her port placed yesterday by Dr. Jackson Latino she ended having a pneumothorax and was at the hospital till late yesterday but that it had resolved.  Informed her of the appt for her echo 7/22 at 10am at [facility] and then reviewed her appts for chemo edu and scans. She is aware of these appts and times.  Contact information given and encouraged her to call with any questions or concerns.

## 2021-07-14 NOTE — Telephone Encounter (Signed)
Patient notified of prior Authorization approval for Lidocaine/Prilocaine Cream 30 grams for 30 days supply. Authorization is good for up to a 90 day supply. Understanding voiced and no other needs verbalized at this time.

## 2021-07-15 ENCOUNTER — Inpatient Hospital Stay: Payer: Medicare Other

## 2021-07-15 ENCOUNTER — Ambulatory Visit (HOSPITAL_COMMUNITY)
Admission: RE | Admit: 2021-07-15 | Discharge: 2021-07-15 | Disposition: A | Discharge status: Home / Self Care | Payer: Medicare Other | Source: Ambulatory Visit | Attending: Hematology and Oncology | Admitting: Hematology and Oncology

## 2021-07-15 ENCOUNTER — Other Ambulatory Visit: Payer: Self-pay | Admitting: *Deleted

## 2021-07-15 ENCOUNTER — Other Ambulatory Visit: Payer: Self-pay

## 2021-07-15 ENCOUNTER — Encounter (HOSPITAL_COMMUNITY): Payer: Self-pay

## 2021-07-15 DIAGNOSIS — Z171 Estrogen receptor negative status [ER-]: Secondary | ICD-10-CM

## 2021-07-15 DIAGNOSIS — R918 Other nonspecific abnormal finding of lung field: Secondary | ICD-10-CM | POA: Diagnosis not present

## 2021-07-15 DIAGNOSIS — Z0189 Encounter for other specified special examinations: Secondary | ICD-10-CM

## 2021-07-15 DIAGNOSIS — C50412 Malignant neoplasm of upper-outer quadrant of left female breast: Secondary | ICD-10-CM

## 2021-07-15 DIAGNOSIS — I2510 Atherosclerotic heart disease of native coronary artery without angina pectoris: Secondary | ICD-10-CM | POA: Diagnosis not present

## 2021-07-15 DIAGNOSIS — I700 Atherosclerosis of aorta: Secondary | ICD-10-CM | POA: Insufficient documentation

## 2021-07-15 DIAGNOSIS — R911 Solitary pulmonary nodule: Secondary | ICD-10-CM | POA: Diagnosis not present

## 2021-07-15 DIAGNOSIS — Z01818 Encounter for other preprocedural examination: Secondary | ICD-10-CM | POA: Insufficient documentation

## 2021-07-15 DIAGNOSIS — R590 Localized enlarged lymph nodes: Secondary | ICD-10-CM | POA: Diagnosis not present

## 2021-07-15 LAB — ECHOCARDIOGRAM COMPLETE
Area-P 1/2: 3.01 cm2
S' Lateral: 2 cm

## 2021-07-15 LAB — POCT I-STAT CREATININE: Creatinine, Ser: 0.9 mg/dL (ref 0.44–1.00)

## 2021-07-15 MED ORDER — LIDOCAINE-PRILOCAINE 2.5-2.5 % EX CREA: TOPICAL_CREAM | CUTANEOUS | 3 refills | Status: DC

## 2021-07-15 MED ORDER — PROCHLORPERAZINE MALEATE 10 MG PO TABS: 10.0000 mg | ORAL_TABLET | Freq: Four times a day (QID) | ORAL | 1 refills | Status: DC | PRN

## 2021-07-15 MED ORDER — DEXAMETHASONE 4 MG PO TABS: 4.0000 mg | ORAL_TABLET | Freq: Every day | ORAL | 1 refills | Status: DC

## 2021-07-15 MED ORDER — ONDANSETRON HCL 8 MG PO TABS: 8.0000 mg | ORAL_TABLET | Freq: Two times a day (BID) | ORAL | 1 refills | Status: DC | PRN

## 2021-07-15 MED ORDER — IOHEXOL 350 MG/ML SOLN
60.0000 mL | Freq: Once | INTRAVENOUS | Status: AC | PRN
  Administered 2021-07-15: 60 mL via INTRAVENOUS

## 2021-07-18 ENCOUNTER — Other Ambulatory Visit: Payer: Self-pay

## 2021-07-18 ENCOUNTER — Encounter: Payer: Self-pay | Admitting: Licensed Clinical Social Worker

## 2021-07-18 ENCOUNTER — Inpatient Hospital Stay (HOSPITAL_BASED_OUTPATIENT_CLINIC_OR_DEPARTMENT_OTHER): Payer: Medicare Other | Admitting: Licensed Clinical Social Worker

## 2021-07-18 ENCOUNTER — Encounter: Payer: Self-pay | Admitting: *Deleted

## 2021-07-18 ENCOUNTER — Inpatient Hospital Stay: Payer: Medicare Other

## 2021-07-18 ENCOUNTER — Encounter: Payer: Self-pay | Admitting: Hematology and Oncology

## 2021-07-18 DIAGNOSIS — Z171 Estrogen receptor negative status [ER-]: Secondary | ICD-10-CM | POA: Diagnosis not present

## 2021-07-18 DIAGNOSIS — C50412 Malignant neoplasm of upper-outer quadrant of left female breast: Secondary | ICD-10-CM | POA: Diagnosis not present

## 2021-07-18 DIAGNOSIS — Z801 Family history of malignant neoplasm of trachea, bronchus and lung: Secondary | ICD-10-CM

## 2021-07-18 DIAGNOSIS — Z800 Family history of malignant neoplasm of digestive organs: Secondary | ICD-10-CM

## 2021-07-18 DIAGNOSIS — Z803 Family history of malignant neoplasm of breast: Secondary | ICD-10-CM

## 2021-07-18 LAB — GENETIC SCREENING ORDER

## 2021-07-18 NOTE — Progress Notes (Signed)
Met with patient/accompanying adult at registration to introduce myself as Arboriculturist and to offer available resources.  Discussed one-time $1000 Radio broadcast assistant to assist with personal expenses while going through treatment. Advised what is needed to apply. She will bring on Thursday to complete process.  Gave her my card if interested in applying and for any additional financial questions or concerns.

## 2021-07-18 NOTE — Progress Notes (Signed)
REFERRING PROVIDER: Nicholas Lose, MD Tina Ellis,  [place] [id number]  PRIMARY PROVIDER:  [name], MD  PRIMARY REASON FOR VISIT:  1. Malignant neoplasm of upper-outer quadrant of left breast in female, estrogen receptor negative ([place])   2. Family history of colon cancer   3. Family history of breast cancer   4. Family history of lung cancer      HISTORY OF PRESENT ILLNESS:   Tina Ellis, a 80 y.o. female, was seen for a [facility] cancer genetics consultation at the request of Dr. Lindi Adie due to a personal and family history of cancer.  Tina Ellis presents to clinic today to discuss the possibility of a hereditary predisposition to cancer, genetic testing, and to further clarify her future cancer risks, as well as potential cancer risks for family members.   In 2022, at the age of 54, Tina Ellis was diagnosed with invasive ductal carcinoma of the left breast, triple negative. The treatment plan includes neoadjuvant chemotherapy, lumpectomy, adjuvant radiation and keytruda.    CANCER HISTORY:  Oncology History  Malignant neoplasm of upper-outer quadrant of left breast in female, estrogen receptor negative ([place])  06/23/2021 Initial Diagnosis   Patient felt left axillary lump which led to mammogram and ultrasound that revealed 4.1 cm left breast mass with a 6.7 cm left axillary lymph node.  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  Lymphovascular invasion noted, ER 0%, PR 0%, HER2 negative   07/12/2021 Cancer Staging   Staging form: Breast, AJCC 8th Edition - Clinical: Stage IIIC (cT2, cN2, cM0, G3, ER-, PR-, HER2-) - Signed by Nicholas Lose, MD on 07/12/2021  Histologic grading system: 3 grade system    07/20/2021 -  Chemotherapy    Patient is on Treatment Plan: BREAST PEMBROLIZUMAB + CARBOPLATIN D1 + PACLITAXEL D1,8,15 Q21D X 4 CYCLES / PEMBROLIZUMAB + AC Q21D X 4 CYCLES          RISK FACTORS:   Menarche was at age 32-16.  First live birth at age 62.  OCP use for approximately 1 years.  Ovaries intact: yes.  Hysterectomy: yes.  Menopausal status: postmenopausal.  HRT use: 0 years. Colonoscopy: yes; reports 10 or more colon polyps. Mammogram within the last year: yes. Number of breast biopsies: 1.   Past Medical History:  Diagnosis Date   Family history of breast cancer    Family history of colon cancer    Family history of lung cancer    GERD (gastroesophageal reflux disease)    Hypercholesteremia    Hypertension    left breast ca 06/2021   Seasonal allergies    TIA (transient ischemic attack)     Past Surgical History:  Procedure Laterality Date   BACK SURGERY     x 2   CHOLECYSTECTOMY     PARTIAL HYSTERECTOMY      Social History   Socioeconomic History   Marital status: Married    Spouse name: Not on file   Number of children: 2   Years of education: HS   Highest education level: Not on file  Occupational History   Occupation: Part-time Press photographer  Tobacco Use   Smoking status: Never   Smokeless tobacco: Never  Vaping Use   Vaping Use: Never used  Substance and Sexual Activity   Alcohol use: No   Drug use: No   Sexual activity: Not Currently  Other Topics Concern   Not on file  Social History Narrative   Lives  at home with son and grandson.   Right-handed.   1-2 cups caffeine per day.   Social Determinants of Health   Financial Resource Strain: Not on file  Food Insecurity: Not on file  Transportation Needs: Not on file  Physical Activity: Not on file  Stress: Not on file  Social Connections: Not on file     FAMILY HISTORY:  We obtained a detailed, 4-generation family history.  Significant diagnoses are listed below: Family History  Problem Relation Age of Onset   Colon cancer Mother    Lung cancer Father    Crohn's disease Brother    Tina Ellis has 2 sons. She has granddaughter and great-granddaughters. She had 1 brother, he passed  around age 58, no cancers.  Tina Ellis's mother died at 75 and had colon cancer. Patient had 2 maternal aunts. One had breast cancer late in life, she died in her 23s. The other aunt had a cancer, unknown type. No known cancers in maternal cousins or grandparents.   Tina Ellis's father died at 77 of lung cancer, he had history of smoking. He was an only child. No known cancers in patient's paternal grandparents.   Tina Ellis is unaware of previous family history of genetic testing for hereditary cancer risks. Patient's maternal ancestors are of English/unknown descent, and paternal ancestors are of unknown descent. There is no reported Ashkenazi Jewish ancestry. There is no known consanguinity.    GENETIC COUNSELING ASSESSMENT: Tina Ellis is a 80 y.o. female with a personal history of triple negative breast cancer which is somewhat suggestive of a hereditary cancer syndrome and predisposition to cancer. We, therefore, discussed and recommended the following at today's visit.   DISCUSSION: We discussed that approximately 5-10% of breast cancer is hereditary. Most cases of hereditary breast cancer are associated with BRCA1/BRCA2 genes, although there are other genes associated with hereditary breast cancer as well including CHEK2, which is also associated with colon cancer. There are other genes we can test as well that are related to other cancers and even colon polyps. Cancers and risks are gene specific.  We discussed that testing is beneficial for several reasons including surgical decision-making for breast cancer, knowing about other cancer risks, identifying potential screening and risk-reduction options that may be appropriate, and to understand if other family members could be at risk for cancer and allow them to undergo genetic testing.   We reviewed the characteristics, features and inheritance patterns of hereditary cancer syndromes. We also discussed genetic testing, including the  appropriate family members to test, the process of testing, insurance coverage and turn-around-time for results. We discussed the implications of a negative, positive and/or variant of uncertain significant result. We recommended Tina Ellis pursue genetic testing for the [employer] Panel +RNA gene panel.   The Multi-Cancer Panel + RNA offered by [facility] includes sequencing and/or deletion duplication testing of the following 84 genes: AIP, ALK, APC, ATM, AXIN2,BAP1,  BARD1, BLM, BMPR1A, BRCA1, BRCA2, BRIP1, CASR, CDC73, CDH1, CDK4, CDKN1B, CDKN1C, CDKN2A (p14ARF), CDKN2A (p16INK4a), CEBPA, CHEK2, CTNNA1, DICER1, DIS3L2, EGFR (c.2369C>T, p.Thr790Met variant only), EPCAM (Deletion/duplication testing only), FH, FLCN, GATA2, GPC3, GREM1 (Promoter region deletion/duplication testing only), HOXB13 (c.251G>A, p.Gly84Glu), HRAS, KIT, MAX, MEN1, MET, MITF (c.952G>A, p.Glu318Lys variant only), MLH1, MSH2, MSH3, MSH6, MUTYH, NBN, NF1, NF2, NTHL1, PALB2, PDGFRA, PHOX2B, PMS2, POLD1, POLE, POT1, PRKAR1A, PTCH1, PTEN, RAD50, RAD51C, RAD51D, RB1, RECQL4, RET, RUNX1, SDHAF2, SDHA (sequence changes only), SDHB, SDHC, SDHD, SMAD4, SMARCA4, SMARCB1, SMARCE1, STK11, SUFU, TERC, TERT, TMEM127, TP53,  TSC1, TSC2, VHL, WRN and WT1.  Based on Tina Ellis's personal and family history of cancer, she meets medical criteria for genetic testing. Despite that she meets criteria, she may still have an out of pocket cost. We discussed that if her out of pocket cost for testing is over $100, the laboratory will call and confirm whether she wants to proceed with testing.  If the out of pocket cost of testing is less than $100 she will be billed by the genetic testing laboratory.   PLAN: After considering the risks, benefits, and limitations, Tina Ellis provided informed consent to pursue genetic testing and the blood sample was sent to [facility] for analysis of the Multi-Cancer+RNA panel. Results should be available within  approximately 2-3 weeks' time, at which point they will be disclosed by telephone to Tina Ellis, as will any additional recommendations warranted by these results. Tina Ellis will receive a summary of her genetic counseling visit and a copy of her results once available. This information will also be available in Epic.   Tina Ellis questions were answered to her satisfaction today. Our contact information was provided should additional questions or concerns arise. Thank you for the referral and allowing [place] to share in the care of your patient.   Tina Rogue, MS, [facility] Genetic Counselor [email or web address] Phone: [phone]  The patient was seen for a total of 30 minutes in face-to-face genetic counseling.  Patient was seen with her daughter-in-law Tina Ellis. Medical student Daneil Dan was also present. Drs. Magrinat/Gudena/and/or Burr Medico were available for discussion regarding this case.   _______________________________________________________________________ For Office Staff:  Number of people involved in session: 2 Was an Intern/ student involved with case: yes

## 2021-07-19 ENCOUNTER — Encounter: Payer: Self-pay | Admitting: Licensed Clinical Social Worker

## 2021-07-19 NOTE — Progress Notes (Signed)
[place] Work  Clinical Social Work was referred by new patient protocol for assessment of psychosocial needs.  Clinical Social Worker contacted patient by phone  to offer support and assess for needs.    Patient reports that she has good support from church community but that all of the appointments coming so quickly have been a bit overwhelming. 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  CSW discussed potential resource support and will meet with patient in infusion on [phone] to review applications.     [place], [place] Worker [employer]

## 2021-07-20 ENCOUNTER — Ambulatory Visit
Admission: RE | Admit: 2021-07-20 | Discharge: 2021-07-20 | Disposition: A | Discharge status: Home / Self Care | Payer: Medicare Other | Source: Ambulatory Visit | Attending: Hematology and Oncology | Admitting: Hematology and Oncology

## 2021-07-20 ENCOUNTER — Other Ambulatory Visit: Payer: Self-pay

## 2021-07-20 DIAGNOSIS — C50412 Malignant neoplasm of upper-outer quadrant of left female breast: Secondary | ICD-10-CM

## 2021-07-20 DIAGNOSIS — N6321 Unspecified lump in the left breast, upper outer quadrant: Secondary | ICD-10-CM | POA: Diagnosis not present

## 2021-07-20 DIAGNOSIS — Z171 Estrogen receptor negative status [ER-]: Secondary | ICD-10-CM

## 2021-07-20 MED ORDER — GADOBUTROL 1 MMOL/ML IV SOLN
7.0000 mL | Freq: Once | INTRAVENOUS | Status: AC | PRN
  Administered 2021-07-20: 7 mL via INTRAVENOUS

## 2021-07-20 NOTE — Progress Notes (Signed)
Pharmacist Chemotherapy Monitoring - Initial Assessment    Anticipated start date: 07/21/21   The following has been reviewed per standard work regarding the patient's treatment regimen: The patient's diagnosis, treatment plan and drug doses, and organ/hematologic function Lab orders and baseline tests specific to treatment regimen  The treatment plan start date, drug sequencing, and pre-medications Prior authorization status  Patient's documented medication list, including drug-drug interaction screen and prescriptions for anti-emetics and supportive care specific to the treatment regimen The drug concentrations, fluid compatibility, administration routes, and timing of the medications to be used The patient's access for treatment and lifetime cumulative dose history, if applicable  The patient's medication allergies and previous infusion related reactions, if applicable   Changes made to treatment plan:  treatment plan date  Follow up needed:  N/A    Kennith Center, Pharm.D., CPP 07/20/2021'@10'$ :03 AM

## 2021-07-21 ENCOUNTER — Encounter: Payer: Self-pay | Admitting: Adult Health

## 2021-07-21 ENCOUNTER — Inpatient Hospital Stay: Payer: Medicare Other

## 2021-07-21 ENCOUNTER — Encounter: Payer: Self-pay | Admitting: Hematology and Oncology

## 2021-07-21 ENCOUNTER — Inpatient Hospital Stay: Payer: Medicare Other | Admitting: Licensed Clinical Social Worker

## 2021-07-21 ENCOUNTER — Encounter: Payer: Self-pay | Admitting: *Deleted

## 2021-07-21 ENCOUNTER — Inpatient Hospital Stay (HOSPITAL_BASED_OUTPATIENT_CLINIC_OR_DEPARTMENT_OTHER): Payer: Medicare Other | Admitting: Adult Health

## 2021-07-21 ENCOUNTER — Encounter: Payer: Self-pay | Admitting: Licensed Clinical Social Worker

## 2021-07-21 VITALS — BP 143/66 | HR 79 | Temp 97.3°F | Resp 18 | Wt 147.9 lb

## 2021-07-21 VITALS — BP 141/73 | HR 62 | Resp 16

## 2021-07-21 DIAGNOSIS — C50412 Malignant neoplasm of upper-outer quadrant of left female breast: Secondary | ICD-10-CM | POA: Diagnosis not present

## 2021-07-21 DIAGNOSIS — C773 Secondary and unspecified malignant neoplasm of axilla and upper limb lymph nodes: Secondary | ICD-10-CM | POA: Diagnosis not present

## 2021-07-21 DIAGNOSIS — Z5112 Encounter for antineoplastic immunotherapy: Secondary | ICD-10-CM | POA: Diagnosis not present

## 2021-07-21 DIAGNOSIS — Z171 Estrogen receptor negative status [ER-]: Secondary | ICD-10-CM | POA: Diagnosis not present

## 2021-07-21 DIAGNOSIS — Z5111 Encounter for antineoplastic chemotherapy: Secondary | ICD-10-CM | POA: Diagnosis not present

## 2021-07-21 DIAGNOSIS — Z79899 Other long term (current) drug therapy: Secondary | ICD-10-CM | POA: Diagnosis not present

## 2021-07-21 DIAGNOSIS — Z95828 Presence of other vascular implants and grafts: Secondary | ICD-10-CM

## 2021-07-21 HISTORY — DX: Presence of other vascular implants and grafts: Z95.828

## 2021-07-21 LAB — COMPREHENSIVE METABOLIC PANEL
ALT: <6 U/L (ref 0–44)
AST: 15 U/L (ref 15–41)
Albumin: 3.7 g/dL (ref 3.5–5.0)
Alkaline Phosphatase: 53 U/L (ref 38–126)
Anion gap: 8 (ref 5–15)
BUN: 21 mg/dL (ref 8–23)
CO2: 26 mmol/L (ref 22–32)
Calcium: 9.8 mg/dL (ref 8.9–10.3)
Chloride: 106 mmol/L (ref 98–111)
Creatinine, Ser: 0.98 mg/dL (ref 0.44–1.00)
GFR, Estimated: 59 mL/min — ABNORMAL LOW (ref >60)
Glucose, Bld: 95 mg/dL (ref 70–99)
Potassium: 4 mmol/L (ref 3.5–5.1)
Sodium: 140 mmol/L (ref 135–145)
Total Bilirubin: 0.5 mg/dL (ref 0.3–1.2)
Total Protein: 7 g/dL (ref 6.5–8.1)

## 2021-07-21 LAB — CBC WITH DIFFERENTIAL/PLATELET
Abs Immature Granulocytes: 0.02 10*3/uL (ref 0.00–0.07)
Basophils Absolute: 0.1 10*3/uL (ref 0.0–0.1)
Basophils Relative: 2 %
Eosinophils Absolute: 0.4 10*3/uL (ref 0.0–0.5)
Eosinophils Relative: 6 %
HCT: 37.1 % (ref 36.0–46.0)
Hemoglobin: 12.7 g/dL (ref 12.0–15.0)
Immature Granulocytes: 0 %
Lymphocytes Relative: 24 %
Lymphs Abs: 1.6 10*3/uL (ref 0.7–4.0)
MCH: 29.7 pg (ref 26.0–34.0)
MCHC: 34.2 g/dL (ref 30.0–36.0)
MCV: 86.9 fL (ref 80.0–100.0)
Monocytes Absolute: 0.9 10*3/uL (ref 0.1–1.0)
Monocytes Relative: 13 %
Neutro Abs: 3.8 10*3/uL (ref 1.7–7.7)
Neutrophils Relative %: 55 %
Platelets: 255 10*3/uL (ref 150–400)
RBC: 4.27 MIL/uL (ref 3.87–5.11)
RDW: 13.3 % (ref 11.5–15.5)
WBC: 6.8 10*3/uL (ref 4.0–10.5)
nRBC: 0 % (ref 0.0–0.2)

## 2021-07-21 LAB — TSH: TSH: 1.241 u[IU]/mL (ref 0.308–3.960)

## 2021-07-21 MED ORDER — FAMOTIDINE 20 MG IN NS 100 ML IVPB
INTRAVENOUS | Status: AC
  Filled 2021-07-21: qty 100

## 2021-07-21 MED ORDER — SODIUM CHLORIDE 0.9% FLUSH
10.0000 mL | INTRAVENOUS | Status: DC | PRN
  Administered 2021-07-21: 10 mL
  Filled 2021-07-21: qty 10

## 2021-07-21 MED ORDER — SODIUM CHLORIDE 0.9% FLUSH
10.0000 mL | Freq: Once | INTRAVENOUS | Status: AC
  Administered 2021-07-21: 10 mL
  Filled 2021-07-21: qty 10

## 2021-07-21 MED ORDER — SODIUM CHLORIDE 0.9 % IV SOLN
Freq: Once | INTRAVENOUS | Status: AC
  Filled 2021-07-21: qty 250

## 2021-07-21 MED ORDER — HEPARIN SOD (PORK) LOCK FLUSH 100 UNIT/ML IV SOLN
500.0000 [IU] | Freq: Once | INTRAVENOUS | Status: AC | PRN
  Administered 2021-07-21: 500 [IU]
  Filled 2021-07-21: qty 5

## 2021-07-21 MED ORDER — CARBOPLATIN CHEMO INJECTION 450 MG/45ML
360.0000 mg | Freq: Once | INTRAVENOUS | Status: AC
  Administered 2021-07-21: 360 mg via INTRAVENOUS
  Filled 2021-07-21: qty 36

## 2021-07-21 MED ORDER — SODIUM CHLORIDE 0.9 % IV SOLN
65.0000 mg/m2 | Freq: Once | INTRAVENOUS | Status: AC
  Administered 2021-07-21: 114 mg via INTRAVENOUS
  Filled 2021-07-21: qty 19

## 2021-07-21 MED ORDER — DIPHENHYDRAMINE HCL 50 MG/ML IJ SOLN
INTRAMUSCULAR | Status: AC
  Filled 2021-07-21: qty 1

## 2021-07-21 MED ORDER — SODIUM CHLORIDE 0.9 % IV SOLN
200.0000 mg | Freq: Once | INTRAVENOUS | Status: AC
  Administered 2021-07-21: 200 mg via INTRAVENOUS
  Filled 2021-07-21: qty 8

## 2021-07-21 MED ORDER — PALONOSETRON HCL INJECTION 0.25 MG/5ML
INTRAVENOUS | Status: AC
  Filled 2021-07-21: qty 5

## 2021-07-21 MED ORDER — PALONOSETRON HCL INJECTION 0.25 MG/5ML
0.2500 mg | Freq: Once | INTRAVENOUS | Status: AC
  Administered 2021-07-21: 0.25 mg via INTRAVENOUS

## 2021-07-21 MED ORDER — SODIUM CHLORIDE 0.9 % IV SOLN
150.0000 mg | Freq: Once | INTRAVENOUS | Status: AC
  Administered 2021-07-21: 150 mg via INTRAVENOUS
  Filled 2021-07-21: qty 150

## 2021-07-21 MED ORDER — FAMOTIDINE 20 MG IN NS 100 ML IVPB
20.0000 mg | Freq: Once | INTRAVENOUS | Status: AC
  Administered 2021-07-21: 20 mg via INTRAVENOUS

## 2021-07-21 MED ORDER — SODIUM CHLORIDE 0.9 % IV SOLN
10.0000 mg | Freq: Once | INTRAVENOUS | Status: AC
  Administered 2021-07-21: 10 mg via INTRAVENOUS
  Filled 2021-07-21: qty 10

## 2021-07-21 MED ORDER — DIPHENHYDRAMINE HCL 50 MG/ML IJ SOLN
50.0000 mg | Freq: Once | INTRAMUSCULAR | Status: AC
  Administered 2021-07-21: 50 mg via INTRAVENOUS

## 2021-07-21 NOTE — Progress Notes (Signed)
[place] CSW Progress Note  Holiday representative met with patient to work on applications for assistance from [place].  Patient was feeling sleepy from [place], so short visit today. Completed [employer] application and CSW submitted today.  CSW provided Pretty in [place] and [employer] applications to patient to work on at home. Will try to meet with patient next week to follow up on progress.  Patient is otherwise doing well today. 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  She also has good family support from her son and 63yo grandson who visit often.     Christeen Douglas LCSW

## 2021-07-21 NOTE — Progress Notes (Signed)
Spoke with MD Lorenso Courier and he would like the calculated Carboplatin dose of 360 mg.

## 2021-07-21 NOTE — Progress Notes (Signed)
[facility] Follow up:    Tina Pretty, MD [address] [place] [address]   DIAGNOSIS: Cancer Staging Malignant neoplasm of upper-outer quadrant of left breast in female, estrogen receptor negative ([place]) Staging form: Breast, AJCC 8th Edition - Clinical: Stage IIIC (cT2, cN2, cM0, G3, ER-, PR-, HER2-) - Signed by Nicholas Lose, MD on 07/12/2021 Histologic grading system: 3 grade system   SUMMARY OF ONCOLOGIC HISTORY: Oncology History  Malignant neoplasm of upper-outer quadrant of left breast in female, estrogen receptor negative ([place])  06/23/2021 Initial Diagnosis   Patient felt left axillary lump which led to mammogram and ultrasound that revealed 4.1 cm left breast mass with a 6.7 cm left axillary lymph node.  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  Lymphovascular invasion noted, ER 0%, PR 0%, HER2 negative   07/12/2021 Cancer Staging   Staging form: Breast, AJCC 8th Edition - Clinical: Stage IIIC (cT2, cN2, cM0, G3, ER-, PR-, HER2-) - Signed by Nicholas Lose, MD on 07/12/2021  Histologic grading system: 3 grade system    07/21/2021 -  Chemotherapy    Patient is on Treatment Plan: BREAST PEMBROLIZUMAB + CARBOPLATIN D1 + PACLITAXEL D1,8,15 Q21D X 4 CYCLES / PEMBROLIZUMAB + AC Q21D X 4 CYCLES         CURRENT THERAPY: Keytruda/Taxol/Carbo  INTERVAL HISTORY: Tina Ellis 80 y.o. female returns for evaluation prior ot receiving her first cycle of neoadjuvant chemotherapy.  She is here with her daughter in law.  She slightly anxious about her first treatment, however has picked up her anti nausea medications and understands how to take them.  She denies any new issues today.     Patient Active Problem List   Diagnosis Date Noted   Port-A-Cath in place 07/21/2021   Family history of colon cancer 07/18/2021   Family history of breast cancer 07/18/2021   Family history of lung  cancer 07/18/2021   Malignant neoplasm of upper-outer quadrant of left breast in female, estrogen receptor negative ([place]) 07/12/2021   Elevated troponin 04/09/2017   TIA (transient ischemic attack) 04/08/2017   Abdominal pain    Stroke-like symptom 04/06/2017   HTN (hypertension) 04/06/2017   GERD (gastroesophageal reflux disease) 04/06/2017   Influenza A 12/15/2013   Hypotension 12/14/2013   Acute renal failure ([place]) 12/14/2013   UTI (lower urinary tract infection) 12/13/2013   Hypotension, unspecified 12/13/2013   Dehydration 12/13/2013    has No Known Allergies.  MEDICAL HISTORY: Past Medical History:  Diagnosis Date   Family history of breast cancer    Family history of colon cancer    Family history of lung cancer    GERD (gastroesophageal reflux disease)    Hypercholesteremia    Hypertension    left breast ca 06/2021   Seasonal allergies    TIA (transient ischemic attack)     SURGICAL HISTORY: Past Surgical History:  Procedure Laterality Date   BACK SURGERY     x 2   CHOLECYSTECTOMY     PARTIAL HYSTERECTOMY      SOCIAL HISTORY: Social History   Socioeconomic History   Marital status: Married    Spouse name: Not on file   Number of children: 2   Years of education: HS   Highest education level: Not on file  Occupational History   Occupation: Part-time Press photographer  Tobacco Use   Smoking status: Never   Smokeless tobacco: Never  Vaping Use   Vaping Use: Never  used  Substance and Sexual Activity   Alcohol use: No   Drug use: No   Sexual activity: Not Currently  Other Topics Concern   Not on file  Social History Narrative   Lives at home with son and grandson.   Right-handed.   1-2 cups caffeine per day.   Social Determinants of Health   Financial Resource Strain: Not on file  Food Insecurity: Not on file  Transportation Needs: Not on file  Physical Activity: Not on file  Stress: Not on file  Social Connections: Not on file  Intimate Partner  Violence: Not on file    FAMILY HISTORY: Family History  Problem Relation Age of Onset   Colon cancer Mother    Lung cancer Father    Crohn's disease Brother     Review of Systems  Constitutional:  Negative for appetite change, chills, fatigue, fever and unexpected weight change.  HENT:   Negative for hearing loss, lump/mass and trouble swallowing.   Eyes:  Negative for eye problems and icterus.  Respiratory:  Negative for chest tightness, cough and shortness of breath.   Cardiovascular:  Negative for chest pain, leg swelling and palpitations.  Gastrointestinal:  Negative for abdominal distention, abdominal pain, constipation, diarrhea, nausea and vomiting.  Endocrine: Negative for hot flashes.  Genitourinary:  Negative for difficulty urinating.   Musculoskeletal:  Negative for arthralgias.  Skin:  Negative for itching and rash.  Neurological:  Negative for dizziness, extremity weakness, headaches and numbness.  Hematological:  Negative for adenopathy. Does not bruise/bleed easily.  Psychiatric/Behavioral:  Negative for depression. The patient is not nervous/anxious.      PHYSICAL EXAMINATION  ECOG PERFORMANCE STATUS: 1 - Symptomatic but completely ambulatory  Vitals:   07/21/21 1200  BP: (!) 143/66  Pulse: 79  Resp: 18  Temp: (!) 97.3 F (36.3 C)  SpO2: 100%    Physical Exam Constitutional:      General: She is not in acute distress.    Appearance: Normal appearance. She is not toxic-appearing.  HENT:     Head: Normocephalic and atraumatic.  Eyes:     General: No scleral icterus. Cardiovascular:     Rate and Rhythm: Normal rate and regular rhythm.     Pulses: Normal pulses.     Heart sounds: Normal heart sounds.  Pulmonary:     Effort: Pulmonary effort is normal.     Breath sounds: Normal breath sounds.  Abdominal:     General: Abdomen is flat. Bowel sounds are normal. There is no distension.     Palpations: Abdomen is soft.     Tenderness: There is no  abdominal tenderness.  Musculoskeletal:        General: No swelling.     Cervical back: Neck supple.  Lymphadenopathy:     Cervical: No cervical adenopathy.  Skin:    General: Skin is warm and dry.     Capillary Refill: Capillary refill takes less than 2 seconds.     Findings: No rash.  Neurological:     General: No focal deficit present.     Mental Status: She is alert.  Psychiatric:        Mood and Affect: Mood normal.        Behavior: Behavior normal.    LABORATORY DATA:  CBC    Component Value Date/Time   WBC 6.8 07/21/2021 1134   RBC 4.27 07/21/2021 1134   HGB 12.7 07/21/2021 1134   HCT 37.1 07/21/2021 1134   PLT 255 07/21/2021  1134   MCV 86.9 07/21/2021 1134   MCH 29.7 07/21/2021 1134   MCHC 34.2 07/21/2021 1134   RDW 13.3 07/21/2021 1134   LYMPHSABS 1.6 07/21/2021 1134   MONOABS 0.9 07/21/2021 1134   EOSABS 0.4 07/21/2021 1134   BASOSABS 0.1 07/21/2021 1134    CMP     Component Value Date/Time   NA 140 07/21/2021 1134   K 4.0 07/21/2021 1134   CL 106 07/21/2021 1134   CO2 26 07/21/2021 1134   GLUCOSE 95 07/21/2021 1134   BUN 21 07/21/2021 1134   CREATININE 0.98 07/21/2021 1134   CALCIUM 9.8 07/21/2021 1134   PROT 7.0 07/21/2021 1134   ALBUMIN 3.7 07/21/2021 1134   AST 15 07/21/2021 1134   ALT <6 07/21/2021 1134   ALKPHOS 53 07/21/2021 1134   BILITOT 0.5 07/21/2021 1134   GFRNONAA 59 (L) 07/21/2021 1134   GFRAA >60 04/10/2017 0538       PENDING LABS:   RADIOGRAPHIC STUDIES:  MR BREAST BILATERAL W WO CONTRAST INC CAD  Result Date: 07/20/2021 CLINICAL DATA:  80 year old female who underwent ultrasound-guided core biopsies of a mass in the upper-outer quadrant of the left breast which was positive for high-grade carcinoma and biopsy of a left axillary mass which was positive for carcinoma and lympho vascular invasion. Negative for lymphoid tissue. LABS:  None obtained at the time of imaging. EXAM: BILATERAL BREAST MRI WITH AND WITHOUT CONTRAST  TECHNIQUE: Multiplanar, multisequence MR images of both breasts were obtained prior to and following the intravenous administration of 7 ml of Gadavist Three-dimensional MR images were rendered by post-processing of the original MR data on an independent workstation. The three-dimensional MR images were interpreted, and findings are reported in the following complete MRI report for this study. Three dimensional images were evaluated at the independent interpreting workstation using the DynaCAD thin client. COMPARISON:  Previous exam(s). FINDINGS: Breast composition: c. Heterogeneous fibroglandular tissue. Background parenchymal enhancement: Moderate. Right breast: No mass or abnormal enhancement. Left breast: In the posterior third of the upper-outer quadrant of the left breast there is a rim enhancing vascular mass measuring 4.6 x 4.5 x 3.7 cm. Signal void artifact is seen in the mass from a biopsy clip. There is diffuse left breast skin thickening. Lymph nodes: There is partially imaged ovoid enhancing mass in the left axilla measuring 5.5 x 3.2 cm. Signal void artifact is seen in the mass from the biopsy clip. Ancillary findings:  None IMPRESSION: 4.6 cm mass in the upper-outer quadrant of the left breast and 5.5 cm mass in the left axilla corresponding with the biopsy proven malignancies. RECOMMENDATION: Treatment planning for the known malignancies in the upper-outer quadrant of the left breast and left axilla is recommended. BI-RADS CATEGORY  6: Known biopsy-proven malignancy. Electronically Signed   By: Lillia Mountain M.D.   On: 07/20/2021 15:30    ASSESSMENT and PLAN:   Malignant neoplasm of upper-outer quadrant of left breast in female, estrogen receptor negative ([place]) 06/23/2021: Patient felt left axillary lump which led to mammogram and ultrasound that revealed 4.1 cm left breast mass with a 6.7 cm left axillary lymph node.  Biopsy came as poorly differentiated high-grade carcinoma, left axillary biopsy  also positive.  Lymphovascular invasion noted, ER 0%, PR 0%, HER2 negative  Pathology and radiology counseling: Discussed with the patient, the details of pathology including the type of breast cancer,the clinical staging, the significance of ER, PR and HER-2/neu receptors and the implications for treatment. After reviewing the pathology  in detail, we proceeded to discuss the different treatment options between surgery, radiation, chemotherapy, antiestrogen therapies.  Staging: CT abdomen pelvis 04/27/2021: No evidence of metastatic disease. CT head 04/27/2021: Performed for dizziness: Stable chronic small vessel ischemic changes   Recommendation 1.  Neoadjuvant chemotherapy and immunotherapy with Taxol Doristine Church followed by Adriamycin Cytoxan Keytruda based on keynote 522 clinical trial beginning 07/21/2021 2. followed by surgery with lumpectomy with ALND 3.  Adjuvant radiation therapy 4.  Keytruda maintenance for 1 year Genetic testing pending ____________________________________________________ Today is cycle 1 day 1 of neoadjuvant Taxol/Carbo/Keytruda Tina Ellis and I reviewed her upcoming neoadjuvant chemotherapy treatment.  I reviewed her labs with her in detail.  We discussed her overall treatment plan, and nausea medications in detail.  She understands the plan and agrees with it.    Tina Ellis will return in one week for labs, f/u, and Taxol only.  She knows to call for any questions that may arise between now and her next appointment.  We are happy to see her sooner if needed.  Total encounter time: 30 minutes in chart review, lab review, care coordination, face to face visit time, and documentation of the encounter.    Wilber Bihari, NP 07/21/21 10:09 PM Medical Oncology and Hematology [facility] [place], [place] [address] Tel. [phone]    Fax. [phone]  *Total Encounter Time as defined by the Centers for Medicare and Medicaid Services includes, in  addition to the face-to-face time of a patient visit (documented in the note above) non-face-to-face time: obtaining and reviewing outside history, ordering and reviewing medications, tests or procedures, care coordination (communications with other health care professionals or caregivers) and documentation in the medical record.

## 2021-07-21 NOTE — Patient Instructions (Signed)
[facility]  Discharge Instructions: Thank you for choosing [facility] to provide your oncology and hematology care.   If you have a lab appointment with the [facility], please go directly to the [facility] and check in at the registration area.   Wear comfortable clothing and clothing appropriate for easy access to any Portacath or PICC line.   We strive to give you quality time with your provider. You may need to reschedule your appointment if you arrive late (15 or more minutes).  Arriving late affects you and other patients whose appointments are after yours.  Also, if you miss three or more appointments without notifying the office, you may be dismissed from the clinic at the provider's discretion.      For prescription refill requests, have your pharmacy contact our office and allow 72 hours for refills to be completed.    Today you received the following chemotherapy and/or immunotherapy agents: Keytruda/Taxol/Carboplatin.      To help prevent nausea and vomiting after your treatment, we encourage you to take your nausea medication as directed.  BELOW ARE SYMPTOMS THAT SHOULD BE REPORTED IMMEDIATELY: *FEVER GREATER THAN 100.4 F (38 C) OR HIGHER *CHILLS OR SWEATING *NAUSEA AND VOMITING THAT IS NOT CONTROLLED WITH YOUR NAUSEA MEDICATION *UNUSUAL SHORTNESS OF BREATH *UNUSUAL BRUISING OR BLEEDING *URINARY PROBLEMS (pain or burning when urinating, or frequent urination) *BOWEL PROBLEMS (unusual diarrhea, constipation, pain near the anus) TENDERNESS IN MOUTH AND THROAT WITH OR WITHOUT PRESENCE OF ULCERS (sore throat, sores in mouth, or a toothache) UNUSUAL RASH, SWELLING OR PAIN  UNUSUAL VAGINAL DISCHARGE OR ITCHING   Items with * indicate a potential emergency and should be followed up as soon as possible or go to the Emergency Department if any problems should occur.  Please show the CHEMOTHERAPY ALERT CARD or IMMUNOTHERAPY ALERT  CARD at check-in to the Emergency Department and triage nurse.  Should you have questions after your visit or need to cancel or reschedule your appointment, please contact [facility]  Dept: [phone]  and follow the prompts.  Office hours are 8:00 a.m. to 4:30 p.m. Monday - Friday. Please note that voicemails left after 4:00 p.m. may not be returned until the following business day.  We are closed weekends and major holidays. You have access to a nurse at all times for urgent questions. Please call the main number to the clinic Dept: [phone] and follow the prompts.   For any non-urgent questions, you may also contact your provider using MyChart. We now offer e-Visits for anyone 65 and older to request care online for non-urgent symptoms. For details visit [email or web address].   Also download the MyChart app! Go to the app store, search "MyChart", open the app, select [facility], and log in with your MyChart username and password.  Due to Covid, a mask is required upon entering the hospital/clinic. If you do not have a mask, one will be given to you upon arrival. For doctor visits, patients may have 1 support person aged 71 or older with them. For treatment visits, patients cannot have anyone with them due to current Covid guidelines and our immunocompromised population.   Pembrolizumab injection What is this medication? PEMBROLIZUMAB (pem broe liz ue mab) is a monoclonal antibody. It is used totreat certain types of cancer. This medicine may be used for other purposes; ask your health care provider orpharmacist if you have questions. COMMON BRAND NAME(S): Keytruda What should I  tell my care team before I take this medication? They need to know if you have any of these conditions: autoimmune diseases like Crohn's disease, ulcerative colitis, or lupus have had or planning to have an allogeneic stem cell transplant (uses someone else's stem cells) history  of organ transplant history of chest radiation nervous system problems like myasthenia gravis or Guillain-Barre syndrome an unusual or allergic reaction to pembrolizumab, other medicines, foods, dyes, or preservatives pregnant or trying to get pregnant breast-feeding How should I use this medication? This medicine is for infusion into a vein. It is given by a health careprofessional in a hospital or clinic setting. A special MedGuide will be given to you before each treatment. Be sure to readthis information carefully each time. Talk to your pediatrician regarding the use of this medicine in children. While this drug may be prescribed for children as young as 6 months for selectedconditions, precautions do apply. Overdosage: If you think you have taken too much of this medicine contact apoison control center or emergency room at once. NOTE: This medicine is only for you. Do not share this medicine with others. What if I miss a dose? It is important not to miss your dose. Call your doctor or health careprofessional if you are unable to keep an appointment. What may interact with this medication? Interactions have not been studied. This list may not describe all possible interactions. Give your health care provider a list of all the medicines, herbs, non-prescription drugs, or dietary supplements you use. Also tell them if you smoke, drink alcohol, or use illegaldrugs. Some items may interact with your medicine. What should I watch for while using this medication? Your condition will be monitored carefully while you are receiving thismedicine. You may need blood work done while you are taking this medicine. Do not become pregnant while taking this medicine or for 4 months after stopping it. Women should inform their doctor if they wish to become pregnant or think they might be pregnant. There is a potential for serious side effects to an unborn child. Talk to your health care professional or  pharmacist for more information. Do not breast-feed an infant while taking this medicine orfor 4 months after the last dose. What side effects may I notice from receiving this medication? Side effects that you should report to your doctor or health care professionalas soon as possible: allergic reactions like skin rash, itching or hives, swelling of the face, lips, or tongue bloody or black, tarry breathing problems changes in vision chest pain chills confusion constipation cough diarrhea dizziness or feeling faint or lightheaded fast or irregular heartbeat fever flushing joint pain low blood counts - this medicine may decrease the number of white blood cells, red blood cells and platelets. You may be at increased risk for infections and bleeding. muscle pain muscle weakness pain, tingling, numbness in the hands or feet persistent headache redness, blistering, peeling or loosening of the skin, including inside the mouth signs and symptoms of high blood sugar such as dizziness; dry mouth; dry skin; fruity breath; nausea; stomach pain; increased hunger or thirst; increased urination signs and symptoms of kidney injury like trouble passing urine or change in the amount of urine signs and symptoms of liver injury like dark urine, light-colored stools, loss of appetite, nausea, right upper belly pain, yellowing of the eyes or skin sweating swollen lymph nodes weight loss Side effects that usually do not require medical attention (report to yourdoctor or health care professional if they continue  or are bothersome): decreased appetite hair loss tiredness This list may not describe all possible side effects. Call your doctor for medical advice about side effects. You may report side effects to FDA at1-800-FDA-1088. Where should I keep my medication? This drug is given in a hospital or clinic and will not be stored at home. NOTE: This sheet is a summary. It may not cover all possible  information. If you have questions about this medicine, talk to your doctor, pharmacist, orhealth care provider.  2022 Elsevier/Gold Standard (2019-11-12 21:44:53)  Paclitaxel injection What is this medication? PACLITAXEL (PAK li TAX el) is a chemotherapy drug. It targets fast dividing cells, like cancer cells, and causes these cells to die. This medicine is used to treat ovarian cancer, breast cancer, lung cancer, Kaposi's sarcoma, andother cancers. This medicine may be used for other purposes; ask your health care provider orpharmacist if you have questions. COMMON BRAND NAME(S): Onxol, Taxol What should I tell my care team before I take this medication? They need to know if you have any of these conditions: history of irregular heartbeat liver disease low blood counts, like low white cell, platelet, or red cell counts lung or breathing disease, like asthma tingling of the fingers or toes, or other nerve disorder an unusual or allergic reaction to paclitaxel, alcohol, polyoxyethylated castor oil, other chemotherapy, other medicines, foods, dyes, or preservatives pregnant or trying to get pregnant breast-feeding How should I use this medication? This drug is given as an infusion into a vein. It is administered in a hospitalor clinic by a specially trained health care professional. Talk to your pediatrician regarding the use of this medicine in children.Special care may be needed. Overdosage: If you think you have taken too much of this medicine contact apoison control center or emergency room at once. NOTE: This medicine is only for you. Do not share this medicine with others. What if I miss a dose? It is important not to miss your dose. Call your doctor or health careprofessional if you are unable to keep an appointment. What may interact with this medication? Do not take this medicine with any of the following medications: live virus vaccines This medicine may also interact with the  following medications: antiviral medicines for hepatitis, HIV or AIDS certain antibiotics like erythromycin and clarithromycin certain medicines for fungal infections like ketoconazole and itraconazole certain medicines for seizures like carbamazepine, phenobarbital, phenytoin gemfibrozil nefazodone rifampin St. John's wort This list may not describe all possible interactions. Give your health care provider a list of all the medicines, herbs, non-prescription drugs, or dietary supplements you use. Also tell them if you smoke, drink alcohol, or use illegaldrugs. Some items may interact with your medicine. What should I watch for while using this medication? Your condition will be monitored carefully while you are receiving this medicine. You will need important blood work done while you are taking thismedicine. This medicine can cause serious allergic reactions. To reduce your risk you will need to take other medicine(s) before treatment with this medicine. If you experience allergic reactions like skin rash, itching or hives, swelling of theface, lips, or tongue, tell your doctor or health care professional right away. In some cases, you may be given additional medicines to help with side effects.Follow all directions for their use. This drug may make you feel generally unwell. This is not uncommon, as chemotherapy can affect healthy cells as well as cancer cells. Report any side effects. Continue your course of treatment even though you feel  ill unless yourdoctor tells you to stop. Call your doctor or health care professional for advice if you get a fever, chills or sore throat, or other symptoms of a cold or flu. Do not treat yourself. This drug decreases your body's ability to fight infections. Try toavoid being around people who are sick. This medicine may increase your risk to bruise or bleed. Call your doctor orhealth care professional if you notice any unusual bleeding. Be careful brushing  and flossing your teeth or using a toothpick because you may get an infection or bleed more easily. If you have any dental work done,tell your dentist you are receiving this medicine. Avoid taking products that contain aspirin, acetaminophen, ibuprofen, naproxen, or ketoprofen unless instructed by your doctor. These medicines may hide afever. Do not become pregnant while taking this medicine. Women should inform their doctor if they wish to become pregnant or think they might be pregnant. There is a potential for serious side effects to an unborn child. Talk to your health care professional or pharmacist for more information. Do not breast-feed aninfant while taking this medicine. Men are advised not to father a child while receiving this medicine. This product may contain alcohol. Ask your pharmacist or healthcare provider if this medicine contains alcohol. Be sure to tell all healthcare providers you are taking this medicine. Certain medicines, like metronidazole and disulfiram, can cause an unpleasant reaction when taken with alcohol. The reaction includes flushing, headache, nausea, vomiting, sweating, and increased thirst. Thereaction can last from 30 minutes to several hours. What side effects may I notice from receiving this medication? Side effects that you should report to your doctor or health care professionalas soon as possible: allergic reactions like skin rash, itching or hives, swelling of the face, lips, or tongue breathing problems changes in vision fast, irregular heartbeat high or low blood pressure mouth sores pain, tingling, numbness in the hands or feet signs of decreased platelets or bleeding - bruising, pinpoint red spots on the skin, black, tarry stools, blood in the urine signs of decreased red blood cells - unusually weak or tired, feeling faint or lightheaded, falls signs of infection - fever or chills, cough, sore throat, pain or difficulty passing urine signs and  symptoms of liver injury like dark yellow or brown urine; general ill feeling or flu-like symptoms; light-colored stools; loss of appetite; nausea; right upper belly pain; unusually weak or tired; yellowing of the eyes or skin swelling of the ankles, feet, hands unusually slow heartbeat Side effects that usually do not require medical attention (report to yourdoctor or health care professional if they continue or are bothersome): diarrhea hair loss loss of appetite muscle or joint pain nausea, vomiting pain, redness, or irritation at site where injected tiredness This list may not describe all possible side effects. Call your doctor for medical advice about side effects. You may report side effects to FDA at1-800-FDA-1088. Where should I keep my medication? This drug is given in a hospital or clinic and will not be stored at home. NOTE: This sheet is a summary. It may not cover all possible information. If you have questions about this medicine, talk to your doctor, pharmacist, orhealth care provider.  2022 Elsevier/Gold Standard (2019-11-12 13:37:23)  Carboplatin injection What is this medication? CARBOPLATIN (KAR boe pla tin) is a chemotherapy drug. It targets fast dividing cells, like cancer cells, and causes these cells to die. This medicine is usedto treat ovarian cancer and many other cancers. This medicine may be used for  other purposes; ask your health care provider orpharmacist if you have questions. COMMON BRAND NAME(S): Paraplatin What should I tell my care team before I take this medication? They need to know if you have any of these conditions: blood disorders hearing problems kidney disease recent or ongoing radiation therapy an unusual or allergic reaction to carboplatin, cisplatin, other chemotherapy, other medicines, foods, dyes, or preservatives pregnant or trying to get pregnant breast-feeding How should I use this medication? This drug is usually given as an  infusion into a vein. It is administered in [facility] or clinic by a specially trained health care professional. Talk to your pediatrician regarding the use of this medicine in children.Special care may be needed. Overdosage: If you think you have taken too much of this medicine contact apoison control center or emergency room at once. NOTE: This medicine is only for you. Do not share this medicine with others. What if I miss a dose? It is important not to miss a dose. Call your doctor or health careprofessional if you are unable to keep an appointment. What may interact with this medication? medicines for seizures medicines to increase blood counts like filgrastim, pegfilgrastim, sargramostim some antibiotics like amikacin, gentamicin, neomycin, streptomycin, tobramycin vaccines Talk to your doctor or health care professional before taking any of thesemedicines: acetaminophen aspirin ibuprofen ketoprofen naproxen This list may not describe all possible interactions. Give your health care provider a list of all the medicines, herbs, non-prescription drugs, or dietary supplements you use. Also tell them if you smoke, drink alcohol, or use illegaldrugs. Some items may interact with your medicine. What should I watch for while using this medication? Your condition will be monitored carefully while you are receiving this medicine. You will need important blood work done while you are taking thismedicine. This drug may make you feel generally unwell. This is not uncommon, as chemotherapy can affect healthy cells as well as cancer cells. Report any side effects. Continue your course of treatment even though you feel ill unless yourdoctor tells you to stop. In some cases, you may be given additional medicines to help with side effects.Follow all directions for their use. Call your doctor or health care professional for advice if you get a fever, chills or sore throat, or other symptoms of a cold or  flu. Do not treat yourself. This drug decreases your body's ability to fight infections. Try toavoid being around people who are sick. This medicine may increase your risk to bruise or bleed. Call your doctor orhealth care professional if you notice any unusual bleeding. Be careful brushing and flossing your teeth or using a toothpick because you may get an infection or bleed more easily. If you have any dental work done,tell your dentist you are receiving this medicine. Avoid taking products that contain aspirin, acetaminophen, ibuprofen, naproxen, or ketoprofen unless instructed by your doctor. These medicines may hide afever. Do not become pregnant while taking this medicine. Women should inform their doctor if they wish to become pregnant or think they might be pregnant. There is a potential for serious side effects to an unborn child. Talk to your health care professional or pharmacist for more information. Do not breast-feed aninfant while taking this medicine. What side effects may I notice from receiving this medication? Side effects that you should report to your doctor or health care professionalas soon as possible: allergic reactions like skin rash, itching or hives, swelling of the face, lips, or tongue signs of infection - fever or chills, cough,  sore throat, pain or difficulty passing urine signs of decreased platelets or bleeding - bruising, pinpoint red spots on the skin, black, tarry stools, nosebleeds signs of decreased red blood cells - unusually weak or tired, fainting spells, lightheadedness breathing problems changes in hearing changes in vision chest pain high blood pressure low blood counts - This drug may decrease the number of white blood cells, red blood cells and platelets. You may be at increased risk for infections and bleeding. nausea and vomiting pain, swelling, redness or irritation at the injection site pain, tingling, numbness in the hands or feet problems with  balance, talking, walking trouble passing urine or change in the amount of urine Side effects that usually do not require medical attention (report to yourdoctor or health care professional if they continue or are bothersome): hair loss loss of appetite metallic taste in the mouth or changes in taste This list may not describe all possible side effects. Call your doctor for medical advice about side effects. You may report side effects to FDA at1-800-FDA-1088. Where should I keep my medication? This drug is given in a hospital or clinic and will not be stored at home. NOTE: This sheet is a summary. It may not cover all possible information. If you have questions about this medicine, talk to your doctor, pharmacist, orhealth care provider.  2022 Elsevier/Gold Standard (2008-03-17 14:38:05)

## 2021-07-21 NOTE — Assessment & Plan Note (Addendum)
06/23/2021: Patient felt left axillary lump which led to mammogram and ultrasound that revealed 4.1 cm left breast mass with a 6.7 cm left axillary lymph node.  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  Lymphovascular invasion noted, ER 0%, PR 0%, HER2 negative  Pathology and radiology counseling: Discussed with the patient, the details of pathology including the type of breast cancer,the clinical staging, the significance of ER, PR and HER-2/neu receptors and the implications for treatment. After reviewing the pathology in detail, we proceeded to discuss the different treatment options between surgery, radiation, chemotherapy, antiestrogen therapies.  Staging: CT abdomen pelvis 04/27/2021: No evidence of metastatic disease. CT head 04/27/2021: Performed for dizziness: Stable chronic small vessel ischemic changes   Recommendation 1.  Neoadjuvant chemotherapy and immunotherapy with Taxol [facility] followed by Adriamycin Cytoxan Keytruda based on keynote 522 clinical trial beginning 07/21/2021 2. followed by surgery with lumpectomy with ALND 3.  Adjuvant radiation therapy 4.  Keytruda maintenance for 1 year Genetic testing pending ____________________________________________________ Today is cycle 1 day 1 of neoadjuvant Taxol/Carbo/Keytruda [facility] and I reviewed her upcoming neoadjuvant chemotherapy treatment.  I reviewed her labs with her in detail.  We discussed her overall treatment plan, and nausea medications in detail.  She understands the plan and agrees with it.    [facility] will return in one week for labs, f/u, and Taxol only.  She knows to call for any questions that may arise between now and her next appointment.  We are happy to see her sooner if needed.

## 2021-07-21 NOTE — Progress Notes (Signed)
Met with patient/accompanying adult at registration whom brought income for [employer].  Discussed in detail expenses and how they are covered. Gave her the approval letter and expense sheet along with the Outpatient pharmacy information. She received a gift card today from her grant.  She has my card for any additional financial questions or concerns.

## 2021-07-22 ENCOUNTER — Ambulatory Visit (HOSPITAL_COMMUNITY): Payer: Medicare Other

## 2021-07-22 ENCOUNTER — Telehealth: Payer: Self-pay

## 2021-07-22 ENCOUNTER — Other Ambulatory Visit: Payer: Self-pay

## 2021-07-22 ENCOUNTER — Encounter (HOSPITAL_COMMUNITY)
Admission: RE | Admit: 2021-07-22 | Discharge: 2021-07-22 | Disposition: A | Discharge status: Home / Self Care | Payer: Medicare Other | Source: Ambulatory Visit | Attending: Hematology and Oncology | Admitting: Hematology and Oncology

## 2021-07-22 DIAGNOSIS — Z171 Estrogen receptor negative status [ER-]: Secondary | ICD-10-CM | POA: Insufficient documentation

## 2021-07-22 DIAGNOSIS — C50412 Malignant neoplasm of upper-outer quadrant of left female breast: Secondary | ICD-10-CM | POA: Diagnosis not present

## 2021-07-22 DIAGNOSIS — C50919 Malignant neoplasm of unspecified site of unspecified female breast: Secondary | ICD-10-CM | POA: Diagnosis not present

## 2021-07-22 LAB — T4: T4, Total: 9.9 ug/dL (ref 4.5–12.0)

## 2021-07-22 MED ORDER — TECHNETIUM TC 99M MEDRONATE IV KIT
18.3000 | PACK | Freq: Once | INTRAVENOUS | Status: AC
  Administered 2021-07-22: 18.3 via INTRAVENOUS

## 2021-07-22 NOTE — Telephone Encounter (Signed)
RN placed call to patient.  Voicemail left for call back.

## 2021-07-25 ENCOUNTER — Ambulatory Visit: Payer: Medicare Other | Admitting: Adult Health

## 2021-07-25 ENCOUNTER — Encounter: Payer: Self-pay | Admitting: *Deleted

## 2021-07-25 NOTE — Progress Notes (Signed)
Patient Care Team: [name], MD as PCP - General (Internal Medicine) Mauro Kaufmann, RN as Oncology Nurse Navigator Rockwell Germany, RN as Oncology Nurse Navigator Nicholas Lose, MD as Consulting Physician (Hematology and Oncology)  DIAGNOSIS:    ICD-10-CM   1. Malignant neoplasm of upper-outer quadrant of left breast in female, estrogen receptor negative ([place])  C50.412    Z17.1       SUMMARY OF ONCOLOGIC HISTORY: Oncology History  Malignant neoplasm of upper-outer quadrant of left breast in female, estrogen receptor negative ([place])  06/23/2021 Initial Diagnosis   Patient felt left axillary lump which led to mammogram and ultrasound that revealed 4.1 cm left breast mass with a 6.7 cm left axillary lymph node.  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  Lymphovascular invasion noted, ER 0%, PR 0%, HER2 negative   07/12/2021 Cancer Staging   Staging form: Breast, AJCC 8th Edition - Clinical: Stage IIIC (cT2, cN2, cM0, G3, ER-, PR-, HER2-) - Signed by Nicholas Lose, MD on 07/12/2021  Histologic grading system: 3 grade system    07/21/2021 - 07/21/2021 Chemotherapy            CHIEF COMPLIANT: Cycle 2 Taxol (chemo being discontinued)  INTERVAL HISTORY: Tina Ellis is a 80 y.o. 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  After last week's chemotherapy she has felt extremely poorly.  During treatments of the Benadryl made her have a really difficult time.  After she reached home she was fatigued and nauseated and extremely tired.  Today she comes to our clinic asking [place] to discontinue further chemotherapy.  She has not been eating or drinking well and therefore she appears to be slightly dehydrated and she will be receiving IV fluids today.  ALLERGIES:  is allergic to conjugated estrogens, fish oil, nitrofurantoin, and benadryl itch stopping  [diphenhydramine hcl].  MEDICATIONS:  Current Outpatient Medications  Medication Sig Dispense Refill   aspirin EC 81 MG EC tablet Take 1 tablet (81 mg total) by mouth daily.     carvedilol (COREG) 12.5 MG tablet Take 1 tablet (12.5 mg total) by mouth 2 (two) times daily with a meal. (Patient taking differently: Take 3.125 mg by mouth 2 (two) times daily with a meal.) 60 tablet 0   cetirizine (ZYRTEC) 10 MG tablet Take 10 mg by mouth at bedtime.     cholecalciferol (VITAMIN D) 1000 UNITS tablet Take 2,000 Units by mouth every morning.     losartan (COZAAR) 100 MG tablet Take 100 mg by mouth every morning.     omeprazole (PRILOSEC) 20 MG capsule Take 20 mg by mouth every morning.      ondansetron (ZOFRAN ODT) 4 MG disintegrating tablet Take 1 tablet (4 mg total) by mouth every 8 (eight) hours as needed for nausea or vomiting. 5 tablet 0   rosuvastatin (CRESTOR) 10 MG tablet Take 1 tablet (10 mg total) by mouth every evening. 30 tablet 0   spironolactone (ALDACTONE) 25 MG tablet Take 25 mg by mouth daily.     No current facility-administered medications for this visit.    PHYSICAL EXAMINATION: ECOG PERFORMANCE STATUS: 1 - Symptomatic but completely ambulatory  Vitals:   07/26/21 1018  BP: (!) 111/52  Pulse: 98  Resp: 18  Temp: (!) 97.3 F (36.3 C)  SpO2: 100%   Filed Weights   07/26/21 1018  Weight: 145 lb 8 oz (66 kg)  LABORATORY DATA:  I have reviewed the data as listed CMP Latest Ref Rng & Units 07/26/2021 07/21/2021 07/15/2021  Glucose 70 - 99 mg/dL 193(H) 95 -  BUN 8 - 23 mg/dL 29(H) 21 -  Creatinine 0.44 - 1.00 mg/dL 0.98 0.98 0.90  Sodium 135 - 145 mmol/L 135 140 -  Potassium 3.5 - 5.1 mmol/L 3.6 4.0 -  Chloride 98 - 111 mmol/L 100 106 -  CO2 22 - 32 mmol/L 25 26 -  Calcium 8.9 - 10.3 mg/dL 9.2 9.8 -  Total Protein 6.5 - 8.1 g/dL 6.6 7.0 -  Total Bilirubin 0.3 - 1.2 mg/dL 1.0 0.5 -  Alkaline Phos 38 - 126 U/L 41 53 -  AST 15 - 41 U/L 20 15 -  ALT 0 - 44 U/L 11 <6 -     Lab Results  Component Value Date   WBC 5.4 07/26/2021   HGB 13.4 07/26/2021   HCT 38.6 07/26/2021   MCV 87.1 07/26/2021   PLT 234 07/26/2021   NEUTROABS 3.6 07/26/2021    ASSESSMENT & PLAN:  Malignant neoplasm of upper-outer quadrant of left breast in female, estrogen receptor negative ([place]) 06/23/2021: Patient felt left axillary lump which led to mammogram and ultrasound that revealed 4.1 cm left breast mass with a 6.7 cm left axillary lymph node.  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  Lymphovascular invasion noted, ER 0%, PR 0%, HER2 negative  Pathology and radiology counseling: Discussed with the patient, the details of pathology including the type of breast cancer,the clinical staging, the significance of ER, PR and HER-2/neu receptors and the implications for treatment. After reviewing the pathology in detail, we proceeded to discuss the different treatment options between surgery, radiation, chemotherapy, antiestrogen therapies.  Staging: CT abdomen pelvis 04/27/2021: No evidence of metastatic disease. CT head 04/27/2021: Performed for dizziness: Stable chronic small vessel ischemic changes   Recommendation 1.  Neoadjuvant chemotherapy and immunotherapy with Taxol Doristine Church followed by Adriamycin Cytoxan Keytruda based on keynote 522 clinical trial beginning 07/21/2021, after first cycle of chemo it is discontinued 2. followed by surgery with lumpectomy with ALND 3.  Adjuvant radiation therapy 4.  Keytruda maintenance for 1 year Genetic testing pending ____________________________________________________ chemo toxicities: 1.  Profound fatigue 2. generalized weakness and requiring wheelchair 3.  Poor appetite and decreased oral intake causing hypotension: We will give her 1 L of normal saline today.  After her first treatment patient has made up her mind that she does not want to receive any further chemo.  We will  refer her to see her surgeon in   to discuss surgical plans for her.    No orders of the defined types were placed in this encounter.  The patient has a good understanding of the overall plan. she agrees with it. she will call with any problems that may develop before the next visit here.  Total time spent: 30 mins including face to face time and time spent for planning, charting and coordination of care  Rulon Eisenmenger, MD, MPH 07/26/2021  I, Thana Ates, am acting as scribe for Dr. Nicholas Lose.  I have reviewed the above documentation for accuracy and completeness, and I agree with the above.

## 2021-07-26 ENCOUNTER — Inpatient Hospital Stay: Payer: Medicare Other | Admitting: Licensed Clinical Social Worker

## 2021-07-26 ENCOUNTER — Other Ambulatory Visit: Payer: Self-pay

## 2021-07-26 ENCOUNTER — Inpatient Hospital Stay: Payer: Medicare Other

## 2021-07-26 ENCOUNTER — Inpatient Hospital Stay (HOSPITAL_BASED_OUTPATIENT_CLINIC_OR_DEPARTMENT_OTHER): Payer: Medicare Other | Admitting: Hematology and Oncology

## 2021-07-26 ENCOUNTER — Inpatient Hospital Stay: Payer: Medicare Other | Attending: Hematology and Oncology

## 2021-07-26 VITALS — BP 103/55

## 2021-07-26 DIAGNOSIS — C50412 Malignant neoplasm of upper-outer quadrant of left female breast: Secondary | ICD-10-CM

## 2021-07-26 DIAGNOSIS — Z171 Estrogen receptor negative status [ER-]: Secondary | ICD-10-CM | POA: Insufficient documentation

## 2021-07-26 DIAGNOSIS — I959 Hypotension, unspecified: Secondary | ICD-10-CM | POA: Diagnosis not present

## 2021-07-26 DIAGNOSIS — C773 Secondary and unspecified malignant neoplasm of axilla and upper limb lymph nodes: Secondary | ICD-10-CM | POA: Diagnosis not present

## 2021-07-26 DIAGNOSIS — Z79899 Other long term (current) drug therapy: Secondary | ICD-10-CM | POA: Diagnosis not present

## 2021-07-26 DIAGNOSIS — Z95828 Presence of other vascular implants and grafts: Secondary | ICD-10-CM

## 2021-07-26 LAB — CBC WITH DIFFERENTIAL (CANCER CENTER ONLY)
Abs Immature Granulocytes: 0.01 10*3/uL (ref 0.00–0.07)
Basophils Absolute: 0 10*3/uL (ref 0.0–0.1)
Basophils Relative: 1 %
Eosinophils Absolute: 0.5 10*3/uL (ref 0.0–0.5)
Eosinophils Relative: 10 %
HCT: 38.6 % (ref 36.0–46.0)
Hemoglobin: 13.4 g/dL (ref 12.0–15.0)
Immature Granulocytes: 0 %
Lymphocytes Relative: 19 %
Lymphs Abs: 1 10*3/uL (ref 0.7–4.0)
MCH: 30.2 pg (ref 26.0–34.0)
MCHC: 34.7 g/dL (ref 30.0–36.0)
MCV: 87.1 fL (ref 80.0–100.0)
Monocytes Absolute: 0.2 10*3/uL (ref 0.1–1.0)
Monocytes Relative: 3 %
Neutro Abs: 3.6 10*3/uL (ref 1.7–7.7)
Neutrophils Relative %: 67 %
Platelet Count: 234 10*3/uL (ref 150–400)
RBC: 4.43 MIL/uL (ref 3.87–5.11)
RDW: 13.2 % (ref 11.5–15.5)
WBC Count: 5.4 10*3/uL (ref 4.0–10.5)
nRBC: 0 % (ref 0.0–0.2)

## 2021-07-26 LAB — CMP (CANCER CENTER ONLY)
ALT: 11 U/L (ref 0–44)
AST: 20 U/L (ref 15–41)
Albumin: 3.7 g/dL (ref 3.5–5.0)
Alkaline Phosphatase: 41 U/L (ref 38–126)
Anion gap: 10 (ref 5–15)
BUN: 29 mg/dL — ABNORMAL HIGH (ref 8–23)
CO2: 25 mmol/L (ref 22–32)
Calcium: 9.2 mg/dL (ref 8.9–10.3)
Chloride: 100 mmol/L (ref 98–111)
Creatinine: 0.98 mg/dL (ref 0.44–1.00)
GFR, Estimated: 59 mL/min — ABNORMAL LOW (ref >60)
Glucose, Bld: 193 mg/dL — ABNORMAL HIGH (ref 70–99)
Potassium: 3.6 mmol/L (ref 3.5–5.1)
Sodium: 135 mmol/L (ref 135–145)
Total Bilirubin: 1 mg/dL (ref 0.3–1.2)
Total Protein: 6.6 g/dL (ref 6.5–8.1)

## 2021-07-26 LAB — TSH: TSH: 2.766 u[IU]/mL (ref 0.350–4.500)

## 2021-07-26 MED ORDER — HEPARIN SOD (PORK) LOCK FLUSH 100 UNIT/ML IV SOLN
500.0000 [IU] | Freq: Once | INTRAVENOUS | Status: AC
Start: 2021-07-26 — End: 2021-07-26
  Administered 2021-07-26: 500 [IU]
  Filled 2021-07-26: qty 5

## 2021-07-26 MED ORDER — SODIUM CHLORIDE 0.9 % IV SOLN
INTRAVENOUS | Status: AC
  Filled 2021-07-26 (×2): qty 250

## 2021-07-26 MED ORDER — SODIUM CHLORIDE 0.9% FLUSH
10.0000 mL | Freq: Once | INTRAVENOUS | Status: AC
  Administered 2021-07-26: 10 mL
  Filled 2021-07-26: qty 10

## 2021-07-26 MED ORDER — HEPARIN SOD (PORK) LOCK FLUSH 100 UNIT/ML IV SOLN
500.0000 [IU] | Freq: Once | INTRAVENOUS | Status: AC
  Administered 2021-07-26: 500 [IU]
  Filled 2021-07-26: qty 5

## 2021-07-26 MED FILL — Dexamethasone Sodium Phosphate Inj 100 MG/10ML: INTRAMUSCULAR | Qty: 1 | Status: AC

## 2021-07-26 NOTE — Assessment & Plan Note (Signed)
06/23/2021: Patient felt left axillary lump which led to mammogram and ultrasound that revealed 4.1 cm left breast mass with a 6.7 cm left axillary lymph node.  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  Lymphovascular invasion noted, ER 0%, PR 0%, HER2 negative  Pathology and radiology counseling: Discussed with the patient, the details of pathology including the type of breast cancer,the clinical staging, the significance of ER, PR and HER-2/neu receptors and the implications for treatment. After reviewing the pathology in detail, we proceeded to discuss the different treatment options between surgery, radiation, chemotherapy, antiestrogen therapies.  Staging: CT abdomen pelvis 04/27/2021: No evidence of metastatic disease. CT head 04/27/2021: Performed for dizziness: Stable chronic small vessel ischemic changes   Recommendation 1.  Neoadjuvant chemotherapy and immunotherapy with Taxol Doristine Church followed by Adriamycin Cytoxan Keytruda based on keynote 522 clinical trial beginning 07/21/2021, after first cycle of chemo it is discontinued 2. followed by surgery with lumpectomy with ALND 3.  Adjuvant radiation therapy 4.  Keytruda maintenance for 1 year Genetic testing pending ____________________________________________________ chemo toxicities: 1.  Profound fatigue 2. generalized weakness and requiring wheelchair 3.  Poor appetite and decreased oral intake causing hypotension: We will give her 1 L of normal saline today.  After her first treatment patient has made up her mind that she does not want to receive any further chemo.  We will refer her to see her surgeon in   to discuss surgical plans for her.

## 2021-07-26 NOTE — Progress Notes (Signed)
[place] CSW Progress Note  [employer] representative met with patient to complete applications for [employer] [place] and [employer].  [place] remaining documents from patient and CSW submitted final applications today. Each organization will contact pt directly regarding decision on assistance.   Christeen Douglas LCSW

## 2021-07-27 ENCOUNTER — Encounter: Payer: Self-pay | Admitting: *Deleted

## 2021-07-27 ENCOUNTER — Inpatient Hospital Stay: Payer: Medicare Other

## 2021-07-27 LAB — T4: T4, Total: 9.4 ug/dL (ref 4.5–12.0)

## 2021-08-01 ENCOUNTER — Encounter: Payer: Self-pay | Admitting: *Deleted

## 2021-08-01 ENCOUNTER — Telehealth: Payer: Self-pay | Admitting: Hematology and Oncology

## 2021-08-01 NOTE — Telephone Encounter (Signed)
Scheduled appt per 8/8 sch msg. Called pt, no answer. Left msg with appt date and time.  

## 2021-08-03 ENCOUNTER — Inpatient Hospital Stay: Payer: Medicare Other

## 2021-08-04 ENCOUNTER — Ambulatory Visit (HOSPITAL_COMMUNITY)
Admission: RE | Admit: 2021-08-04 | Discharge: 2021-08-04 | Disposition: A | Discharge status: Home / Self Care | Payer: Medicare Other | Source: Ambulatory Visit | Attending: Surgery | Admitting: Surgery

## 2021-08-04 ENCOUNTER — Other Ambulatory Visit (HOSPITAL_COMMUNITY): Payer: Self-pay | Admitting: Surgery

## 2021-08-04 ENCOUNTER — Other Ambulatory Visit: Payer: Self-pay

## 2021-08-04 DIAGNOSIS — R52 Pain, unspecified: Secondary | ICD-10-CM

## 2021-08-04 DIAGNOSIS — M79662 Pain in left lower leg: Secondary | ICD-10-CM | POA: Diagnosis not present

## 2021-08-04 DIAGNOSIS — C50912 Malignant neoplasm of unspecified site of left female breast: Secondary | ICD-10-CM | POA: Diagnosis not present

## 2021-08-04 DIAGNOSIS — R0989 Other specified symptoms and signs involving the circulatory and respiratory systems: Secondary | ICD-10-CM | POA: Diagnosis not present

## 2021-08-04 DIAGNOSIS — C773 Secondary and unspecified malignant neoplasm of axilla and upper limb lymph nodes: Secondary | ICD-10-CM | POA: Diagnosis not present

## 2021-08-04 NOTE — Progress Notes (Signed)
Left lower extremity venous duplex completed. Refer to "CV Proc" under chart review to view preliminary results.  08/04/2021 3:20 PM Kelby Aline., MHA, RVT, RDCS, RDMS

## 2021-08-07 LAB — SURGICAL PATHOLOGY

## 2021-08-10 ENCOUNTER — Ambulatory Visit: Payer: Medicare Other

## 2021-08-10 ENCOUNTER — Ambulatory Visit: Payer: Medicare Other | Admitting: Hematology and Oncology

## 2021-08-10 ENCOUNTER — Other Ambulatory Visit: Payer: Medicare Other

## 2021-08-10 ENCOUNTER — Encounter: Payer: Self-pay | Admitting: Licensed Clinical Social Worker

## 2021-08-10 NOTE — Progress Notes (Signed)
[place] CSW Progress Note  Clinical Education officer, museum received notification from [place] with [employer] [place] that patient has been approved for assistance. Vicente Males will also send letter directly to patient with further information on assistance for medical bills.    Christeen Douglas , LCSW

## 2021-08-15 ENCOUNTER — Encounter: Payer: Self-pay | Admitting: *Deleted

## 2021-08-15 DIAGNOSIS — Z0000 Encounter for general adult medical examination without abnormal findings: Secondary | ICD-10-CM | POA: Diagnosis not present

## 2021-08-15 DIAGNOSIS — Z01810 Encounter for preprocedural cardiovascular examination: Secondary | ICD-10-CM | POA: Diagnosis not present

## 2021-08-17 ENCOUNTER — Ambulatory Visit: Payer: Medicare Other

## 2021-08-17 DIAGNOSIS — Z20822 Contact with and (suspected) exposure to covid-19: Secondary | ICD-10-CM | POA: Diagnosis not present

## 2021-08-23 DIAGNOSIS — C50912 Malignant neoplasm of unspecified site of left female breast: Secondary | ICD-10-CM | POA: Diagnosis not present

## 2021-08-23 DIAGNOSIS — Z7982 Long term (current) use of aspirin: Secondary | ICD-10-CM | POA: Diagnosis not present

## 2021-08-23 DIAGNOSIS — I10 Essential (primary) hypertension: Secondary | ICD-10-CM | POA: Diagnosis not present

## 2021-08-23 DIAGNOSIS — R928 Other abnormal and inconclusive findings on diagnostic imaging of breast: Secondary | ICD-10-CM | POA: Diagnosis not present

## 2021-08-23 DIAGNOSIS — Z8673 Personal history of transient ischemic attack (TIA), and cerebral infarction without residual deficits: Secondary | ICD-10-CM | POA: Diagnosis not present

## 2021-08-23 DIAGNOSIS — Z171 Estrogen receptor negative status [ER-]: Secondary | ICD-10-CM | POA: Diagnosis not present

## 2021-08-23 DIAGNOSIS — C773 Secondary and unspecified malignant neoplasm of axilla and upper limb lymph nodes: Secondary | ICD-10-CM | POA: Diagnosis not present

## 2021-08-23 DIAGNOSIS — Z79899 Other long term (current) drug therapy: Secondary | ICD-10-CM | POA: Diagnosis not present

## 2021-08-23 DIAGNOSIS — Z800 Family history of malignant neoplasm of digestive organs: Secondary | ICD-10-CM | POA: Diagnosis not present

## 2021-08-23 DIAGNOSIS — E785 Hyperlipidemia, unspecified: Secondary | ICD-10-CM | POA: Diagnosis not present

## 2021-08-23 DIAGNOSIS — C50412 Malignant neoplasm of upper-outer quadrant of left female breast: Secondary | ICD-10-CM | POA: Diagnosis not present

## 2021-08-23 DIAGNOSIS — Z888 Allergy status to other drugs, medicaments and biological substances status: Secondary | ICD-10-CM | POA: Diagnosis not present

## 2021-08-23 DIAGNOSIS — Z801 Family history of malignant neoplasm of trachea, bronchus and lung: Secondary | ICD-10-CM | POA: Diagnosis not present

## 2021-08-24 ENCOUNTER — Ambulatory Visit: Payer: Medicare Other

## 2021-08-24 DIAGNOSIS — C50412 Malignant neoplasm of upper-outer quadrant of left female breast: Secondary | ICD-10-CM | POA: Diagnosis not present

## 2021-08-24 DIAGNOSIS — Z8673 Personal history of transient ischemic attack (TIA), and cerebral infarction without residual deficits: Secondary | ICD-10-CM | POA: Diagnosis not present

## 2021-08-24 DIAGNOSIS — E785 Hyperlipidemia, unspecified: Secondary | ICD-10-CM | POA: Diagnosis not present

## 2021-08-24 DIAGNOSIS — I10 Essential (primary) hypertension: Secondary | ICD-10-CM | POA: Diagnosis not present

## 2021-08-24 DIAGNOSIS — C773 Secondary and unspecified malignant neoplasm of axilla and upper limb lymph nodes: Secondary | ICD-10-CM | POA: Diagnosis not present

## 2021-08-24 DIAGNOSIS — Z79899 Other long term (current) drug therapy: Secondary | ICD-10-CM | POA: Diagnosis not present

## 2021-08-30 DIAGNOSIS — C50912 Malignant neoplasm of unspecified site of left female breast: Secondary | ICD-10-CM | POA: Diagnosis not present

## 2021-08-30 DIAGNOSIS — C773 Secondary and unspecified malignant neoplasm of axilla and upper limb lymph nodes: Secondary | ICD-10-CM | POA: Diagnosis not present

## 2021-08-31 ENCOUNTER — Encounter: Payer: Self-pay | Admitting: *Deleted

## 2021-09-01 ENCOUNTER — Telehealth: Payer: Self-pay | Admitting: Licensed Clinical Social Worker

## 2021-09-01 NOTE — Telephone Encounter (Signed)
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 This normal result is reassuring and indicates that it is unlikely Tina Ellis cancer is due to a hereditary cause.  It is unlikely that there is an increased risk of another cancer due to a mutation in one of these genes.  However, genetic testing is not perfect, and cannot definitively rule out a hereditary cause.  It will be important for her to keep in contact with genetics to learn if any additional testing may be needed in the future.

## 2021-09-03 NOTE — Progress Notes (Signed)
Patient Care Team: [name], MD as PCP - General (Internal Medicine) Mauro Kaufmann, RN as Oncology Nurse Navigator Rockwell Germany, RN as Oncology Nurse Navigator Nicholas Lose, MD as Consulting Physician (Hematology and Oncology)  DIAGNOSIS:    ICD-10-CM   1. Malignant neoplasm of upper-outer quadrant of left breast in female, estrogen receptor negative ([place])  C50.412    Z17.1       SUMMARY OF ONCOLOGIC HISTORY: Oncology History  Malignant neoplasm of upper-outer quadrant of left breast in female, estrogen receptor negative ([place])  06/23/2021 Initial Diagnosis   Patient felt left axillary lump which led to mammogram and ultrasound that revealed 4.1 cm left breast mass with a 6.7 cm left axillary lymph node.  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  Lymphovascular invasion noted, ER 0%, PR 0%, HER2 negative   07/12/2021 Cancer Staging   Staging form: Breast, AJCC 8th Edition - Clinical: Stage IIIC (cT2, cN2, cM0, G3, ER-, PR-, HER2-) - Signed by Nicholas Lose, MD on 07/12/2021 Histologic grading system: 3 grade system   07/21/2021 - 07/21/2021 Chemotherapy   1 cycle of Taxol carboplatin and Keytruda.  Chemo discontinued for toxicities        07/2021 Surgery   Left mastectomy: Pathologic complete response     CHIEF COMPLIANT: Follow-up status post surgery  INTERVAL HISTORY: Tina Ellis is a 80 y.o. with above-mentioned history of left breast cancer. She underwent left breast mastectomy on 08/23/2021 with Dr. Otelia Santee. She presents to the clinic today for follow-up.  She is recovering very well from recent surgery.  Denies any pain or discomfort.  She had a remarkable response to 1 cycle of chemo and had a pathologic complete response.  ALLERGIES:  is allergic to conjugated estrogens, fish oil, nitrofurantoin, and benadryl itch stopping [diphenhydramine hcl].  MEDICATIONS:  Current Outpatient Medications   Medication Sig Dispense Refill   aspirin EC 81 MG EC tablet Take 1 tablet (81 mg total) by mouth daily.     carvedilol (COREG) 12.5 MG tablet Take 1 tablet (12.5 mg total) by mouth 2 (two) times daily with a meal. (Patient taking differently: Take 3.125 mg by mouth 2 (two) times daily with a meal.) 60 tablet 0   cetirizine (ZYRTEC) 10 MG tablet Take 10 mg by mouth at bedtime.     cholecalciferol (VITAMIN D) 1000 UNITS tablet Take 2,000 Units by mouth every morning.     losartan (COZAAR) 100 MG tablet Take 100 mg by mouth every morning.     omeprazole (PRILOSEC) 20 MG capsule Take 20 mg by mouth every morning.      rosuvastatin (CRESTOR) 10 MG tablet Take 1 tablet (10 mg total) by mouth every evening. 30 tablet 0   No current facility-administered medications for this visit.    PHYSICAL EXAMINATION: ECOG PERFORMANCE STATUS: 1 - Symptomatic but completely ambulatory  Vitals:   09/05/21 1501  BP: (!) 159/64  Pulse: 73  Resp: 18  Temp: (!) 97 F (36.1 C)  SpO2: 100%   Filed Weights   09/05/21 1501  Weight: 145 lb 9 oz (66 kg)      LABORATORY DATA:  I have reviewed the data as listed CMP Latest Ref Rng & Units 07/26/2021 07/21/2021 07/15/2021  Glucose 70 - 99 mg/dL 193(H) 95 -  BUN 8 - 23 mg/dL 29(H) 21 -  Creatinine 0.44 - 1.00 mg/dL 0.98 0.98 0.90  Sodium 135 - 145 mmol/L 135 140 -  Potassium 3.5 - 5.1 mmol/L 3.6 4.0 -  Chloride 98 - 111 mmol/L 100 106 -  CO2 22 - 32 mmol/L 25 26 -  Calcium 8.9 - 10.3 mg/dL 9.2 9.8 -  Total Protein 6.5 - 8.1 g/dL 6.6 7.0 -  Total Bilirubin 0.3 - 1.2 mg/dL 1.0 0.5 -  Alkaline Phos 38 - 126 U/L 41 53 -  AST 15 - 41 U/L 20 15 -  ALT 0 - 44 U/L 11 <6 -    Lab Results  Component Value Date   WBC 5.4 07/26/2021   HGB 13.4 07/26/2021   HCT 38.6 07/26/2021   MCV 87.1 07/26/2021   PLT 234 07/26/2021   NEUTROABS 3.6 07/26/2021    ASSESSMENT & PLAN:  Malignant neoplasm of upper-outer quadrant of left breast in female, estrogen receptor  negative ([place]) 06/23/2021: Patient felt left axillary lump which led to mammogram and ultrasound that revealed 4.1 cm left breast mass with a 6.7 cm left axillary lymph node.  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  Lymphovascular invasion noted, ER 0%, PR 0%, HER2 negative   Staging: CT abdomen pelvis 04/27/2021: No evidence of metastatic disease. CT head 04/27/2021: Performed for dizziness: Stable chronic small vessel ischemic changes    Recommendation 1.  Neoadjuvant chemotherapy and immunotherapy with Taxol carbo Keytruda  X 1 (discontinued for adverse effects) 2. followed by surgery with left mastectomy with ALND: At [facility]: Complete PR (Dr.Tjoe) 3.  Adjuvant radiation therapy  Genetic testing pending ____________________________________________________ Return to clinic in 1 year for follow-up. We will alternate surveillance checkups between Dr.Tjoe and myself   No orders of the defined types were placed in this encounter.  The patient has a good understanding of the overall plan. she agrees with it. she will call with any problems that may develop before the next visit here.  Total time spent: 20 mins including face to face time and time spent for planning, charting and coordination of care  Rulon Eisenmenger, MD, MPH 09/05/2021  I, Thana Ates, am acting as scribe for Dr. Nicholas Lose.  I have reviewed the above documentation for accuracy and completeness, and I agree with the above.

## 2021-09-05 ENCOUNTER — Inpatient Hospital Stay: Payer: Medicare Other | Attending: Hematology and Oncology | Admitting: Hematology and Oncology

## 2021-09-05 ENCOUNTER — Other Ambulatory Visit: Payer: Self-pay

## 2021-09-05 ENCOUNTER — Other Ambulatory Visit: Payer: Self-pay | Admitting: Hematology and Oncology

## 2021-09-05 DIAGNOSIS — C773 Secondary and unspecified malignant neoplasm of axilla and upper limb lymph nodes: Secondary | ICD-10-CM | POA: Diagnosis not present

## 2021-09-05 DIAGNOSIS — Z171 Estrogen receptor negative status [ER-]: Secondary | ICD-10-CM | POA: Diagnosis not present

## 2021-09-05 DIAGNOSIS — Z79899 Other long term (current) drug therapy: Secondary | ICD-10-CM | POA: Diagnosis not present

## 2021-09-05 DIAGNOSIS — C50412 Malignant neoplasm of upper-outer quadrant of left female breast: Secondary | ICD-10-CM

## 2021-09-05 NOTE — Progress Notes (Signed)
Location of Breast Cancer:  Malignant neoplasm of upper-outer quadrant of LEFT breast, estrogen receptor negative  Histology per Pathology Report:  08/23/2021 Final Diagnosis   Breast, left,modified radical mastectomy: --Negative for residual malignancy. --Fibrous tumor bed with prior biopsy site ([id number]). --Three (3) lymph nodes identified, one (1) showing marked fibrosis but no residual tumor seen. --Pathologic stage:  tpT0 pN0         Receptor Status: ER(Negative), PR (Negative), Her2-neu (Negative)  Did patient present with symptoms (if so, please note symptoms) or was this found on screening mammography?: Patient felt left axillary lump which led to mammogram and ultrasound that revealed 4.1 cm left breast mass with a 6.7 cm left axillary lymph node  Past/Anticipated interventions by surgeon, if any:  08/23/2021 Dr. Otelia Santee [facility]) --Left breast total mastectomy modified radical with targeted left axillary node dissection   Past/Anticipated interventions by medical oncology, if any:  Under care of Dr. Nicholas Lose 09/05/2021 --Recommendation Neoadjuvant chemotherapy and immunotherapy with Taxol Doristine Church  X 1 (discontinued for adverse effects) followed by surgery with left mastectomy with ALND: At [facility]: Complete PR (Dr.Tjoe) Adjuvant radiation therapy --Genetic testing pending --Return to clinic in 1 year for follow-up. --We will alternate surveillance checkups between Dr.Tjoe and myself  Lymphedema issues, if any:  Patient denies. Does report she has a PT assessment later today to help with range of motion    Pain issues, if any:  Reports occasional discomfort to her chest since surgery, but states it's manageable    SAFETY ISSUES: Prior radiation? No Pacemaker/ICD? No Possible current pregnancy? No--postmenopausal Is the patient on methotrexate? No  Current Complaints / other details:  Patient has received the first 2 Pfizer vaccines, but  states she is not interested in receiving a booster vaccine

## 2021-09-05 NOTE — Assessment & Plan Note (Addendum)
06/23/2021: Patient felt left axillary lump which led to mammogram and ultrasound that revealed 4.1 cm left breast mass with a 6.7 cm left axillary lymph node.  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  Lymphovascular invasion noted, ER 0%, PR 0%, HER2 negative  Staging: CT abdomen pelvis 04/27/2021: No evidence of metastatic disease. CT head 04/27/2021: Performed for dizziness: Stable chronic small vessel ischemic changes   Recommendation 1.  Neoadjuvant chemotherapy and immunotherapy with Taxol carbo Keytruda  X 1 (discontinued for adverse effects) 2. followed by surgery with lumpectomy with ALND: At [facility]: Complete PR 3.  Adjuvant radiation therapy  Genetic testing pending ____________________________________________________

## 2021-09-05 NOTE — Progress Notes (Signed)
Radiation Oncology         [phone]) [phone] ________________________________  Initial Outpatient Consultation  Name: Tina Ellis MRN: [id number]  Date: 09/06/2021  DOB: 06-Nov-1941  KV:[id number], Tina Jew, MD  Tina Jewett, MD   REFERRING PHYSICIAN: [name], MD  DIAGNOSIS:    ICD-10-CM   1. 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 female who presented with breast abnormality on the following imaging: bilateral digital diagnostic mammogram on the date of 06/06/21 which revealed a new lobular mass in the left breast, upper outer quadrant, posterior depth. Benign course calcifications were appreciated in the left breast, as well as a large mass in the left axilla  Symptoms, if any, at that time, were: a palpable left axillary lump. Ultrasound of the left breast on 06/06/21 further revealed the lobular hypoechoic mass with indistinct margins in the upper outer quadrant, measuring 4.1 x 3.2 x 2.6 cm; 7 cm from the nipple; with interior blood flow. A small lymph node was also noted in the upper outer quadrant, 8 cmfn, measuring 6 mm. Focused left axillary ultrasound also revealed large hypoechoic oval mass measuring 6.7 x 4.3 x 2.9 cm with internal blood flow.   Left breast and left axillary lymph node biopsy on date of 06/23/21 showed poorly differentiated  high-grade carcinoma, and metastatic carcinoma to the left axillary lymph node with lymphovascular space invasion present. ER status: negative; PR status negative, Her2 status negative.    CT scan of chest on July 15, 2021 showed a 4.2 cm left breast mass with a 4.9 cm left axillary node and a 1 cm left axillary lymph node at the margin of the pectoralis.  Bone scan on July 29 was negative.  Dr. Lindi Ellis recommended the treatment plan of neoadjuvant chemotherapy and immunotherapy with Taxol Doristine Church followed by Adriamycin Cytoxan Keytruda, followed by surgery with lumpectomy and ALND, adjuvant radiation therapy, and Keytruda maintenance for 1 year (+ genetic testing). The patient received first chemo infusion on 07/21/21 with Taxol/Carbo/Keytruda however tolerated this poorly and opted to discontinue further chemotherapy.  Accordingly, the patient then met with Dr. Jackson Ellis on 08/04/21 for consideration of surgical intervention. The patient opted to proceed with left modified radical mastectomy. The patient additionally reported a 1 day history of new left lower extremity calf pain with positive Homan's sign, though was found to have no evidence of DVT in either leg from bilateral lower extremity duplex scanning.   Left breast radical mastectomy and targeted left axillary lymph node dissection performed on 08/23/21 was negative for residual malignancy. 1/3 lymph nodes showed marked fibrosis however no residual tumor or metastasis was seen.  ypT0N0  Pertinent imaging as above includes the following:  --Chest CT performed on 07/15/21 demonstrated left breast mass with bulky left axillary lymphadenopathy as previously seen, as well some small right upper lobe pulmonary  nodules (noted as non-specific).  --Bilateral breast MRI on 07/20/21 revealed the 4.6 cm mass in the upper-outer quadrant of the left breast and the 5.5 cm mass in the left axilla corresponding with the biopsy proven malignancies. --Whole  body bone scan performed on 07/22/21 revealed no definite evidence of osteoblastic metastatic disease.  Of note, the patient underwent brain MRI and CT scan of chest/abdomen/pelvis during [place] ER visit on 04/27/21 (due to hypertension and vomiting). No evidence of metastatic disease was noted at that time.  She is here with her daughter.  She is recovering well from surgery.  She still has range of motion issues in her left shoulder and she starts physical therapy today.  She is in good spirits.  She is a non-smoker.  PREVIOUS RADIATION THERAPY: No  PAST MEDICAL HISTORY:  has a past medical history of Family history of breast cancer, Family history of colon cancer, Family history of lung cancer, GERD (gastroesophageal reflux disease), Hypercholesteremia, Hypertension, left breast ca (06/2021), Seasonal allergies, and TIA (transient ischemic attack).    PAST SURGICAL HISTORY: Past Surgical History:  Procedure Laterality Date   BACK SURGERY     x 2   CHOLECYSTECTOMY     PARTIAL HYSTERECTOMY      FAMILY HISTORY: family history includes Colon cancer in her mother; Crohn's disease in her brother; Lung cancer in her father.  SOCIAL HISTORY:  reports that she has never smoked. She has never used smokeless tobacco. She reports that she does not drink alcohol and does not use drugs.  ALLERGIES: Conjugated estrogens, Fish oil, Nitrofurantoin, Benadryl itch stopping [diphenhydramine hcl], Other, and Pneumococcal 13-val conj vacc  MEDICATIONS:  Current Outpatient Medications  Medication Sig Dispense Refill   amLODipine (NORVASC) 2.5 MG tablet Take 2.5 mg by mouth daily.     aspirin EC 81 MG EC tablet Take 1 tablet (81 mg total) by mouth daily.     carvedilol (COREG) 12.5 MG tablet Take 1 tablet (12.5 mg total) by mouth 2 (two) times daily with a meal. (Patient taking differently: Take 3.125 mg by mouth 2 (two) times daily with a meal.) 60 tablet 0   cetirizine (ZYRTEC) 10 MG tablet Take 10  mg by mouth at bedtime.     cholecalciferol (VITAMIN D) 1000 UNITS tablet Take 2,000 Units by mouth every morning.     hydrochlorothiazide (HYDRODIURIL) 12.5 MG tablet Take 12.5 mg by mouth daily.     losartan (COZAAR) 100 MG tablet Take 100 mg by mouth every morning.     losartan (COZAAR) 100 MG tablet Take 1 tablet by mouth daily.     omeprazole (PRILOSEC) 20 MG capsule Take 20 mg by mouth every morning.      rosuvastatin (CRESTOR) 10 MG tablet Take 1 tablet (10 mg total) by mouth every evening. 30 tablet 0   No current facility-administered medications for this encounter.    REVIEW OF SYSTEMS: As above   PHYSICAL EXAM:  height is 5' 6" (1.676 m) and weight is 145 lb 4 oz (65.9 kg). Her temporal temperature is 97 F (36.1 C) (abnormal). Her blood pressure is 128/74 and her pulse is 75. Her respiration is 18 and oxygen saturation is 99%.   General: Alert and oriented, in no acute distress Heart: Regular in rate and rhythm with no murmurs, rubs, or gallops. Chest: Clear to auscultation bilaterally, with no rhonchi, wheezes, or rales. Psychiatric: Judgment and insight are intact. Affect is appropriate. Breasts: Satisfactory healing of left mastectomy scar  thus far.  Drain has been removed. MSK: She uses a cane to ambulate  ECOG = 2  0 - Asymptomatic (Fully active, able to carry on all predisease activities without restriction)  1 - Symptomatic but completely ambulatory (Restricted in physically strenuous activity but ambulatory and able to carry out work of a light or sedentary nature. For example, light housework, office work)  2 - Symptomatic, <50% in bed during the day (Ambulatory and capable of all self care but unable to carry out any work activities. Up and about more than 50% of waking hours)  3 - Symptomatic, >50% in bed, but not bedbound (Capable of only limited self-care, confined to bed or chair 50% or more of waking hours)  4 - Bedbound (Completely disabled. Cannot carry on  any self-care. Totally confined to bed or chair)  5 - Death   Eustace Pen MM, Creech RH, Tormey DC, et al. [phone]). "Toxicity and response criteria of the [facility]". [place] Oncol. 5 (6): 649-55   LABORATORY DATA:  Lab Results  Component Value Date   WBC 5.4 07/26/2021   HGB 13.4 07/26/2021   HCT 38.6 07/26/2021   MCV 87.1 07/26/2021   PLT 234 07/26/2021   CMP     Component Value Date/Time   NA 135 07/26/2021 0950   K 3.6 07/26/2021 0950   CL 100 07/26/2021 0950   CO2 25 07/26/2021 0950   GLUCOSE 193 (H) 07/26/2021 0950   BUN 29 (H) 07/26/2021 0950   CREATININE 0.98 07/26/2021 0950   CALCIUM 9.2 07/26/2021 0950   PROT 6.6 07/26/2021 0950   ALBUMIN 3.7 07/26/2021 0950   AST 20 07/26/2021 0950   ALT 11 07/26/2021 0950   ALKPHOS 41 07/26/2021 0950   BILITOT 1.0 07/26/2021 0950   GFRNONAA 59 (L) 07/26/2021 0950   GFRAA >60 04/10/2017 0538         RADIOGRAPHY: As above   IMPRESSION/PLAN: This is a very pleasant 80 year old woman with a history of locally advanced left breast cancer, complete pathologic response to 1 cycle of systemic therapy  We discussed the paucity of data regarding whether radiation can be held after patients have a complete response to systemic therapy before mastectomy.  Given the locally advanced and aggressive nature of her disease, and the limited sampling of lymph nodes that took place, I recommend that we proceed with radiation therapy to minimize the chance of a local regional recurrence.  I believe that the risks of radiation are outweighed by the potential benefits.  She and her daughter are enthusiastic about proceeding with treatment.  She will start physical therapy today.  We will schedule her for a CT simulation in about 3 to 4 weeks so that she has some time to improve the range of motion in her left shoulder.  We spoke about acute effects including skin irritation and fatigue as well as much less common late  effects including internal organ injury (ie heart or lung) or irritation, lymphedema, rib fracture, and / or permanent tissue injury. We spoke about the latest technology that is used to minimize the risk of late effects for patients undergoing radiotherapy to the breast or chest wall. No guarantees of treatment were given. The patient is enthusiastic about proceeding with treatment. I look forward to participating in the patient's care.   Consent signed today.  On date of service, in total, I spent 60 minutes on this encounter. Patient was seen in person.   __________________________________________   , MD  This document serves as a record of services personally performed by  , MD. It was created on her behalf by Elisa Frazier, a trained medical scribe. The creation of this record is based on the scribe's personal observations and the provider's statements to them. This document has been checked and approved by the attending provider.  

## 2021-09-06 ENCOUNTER — Encounter: Payer: Self-pay | Admitting: Licensed Clinical Social Worker

## 2021-09-06 ENCOUNTER — Ambulatory Visit: Payer: Self-pay | Admitting: Licensed Clinical Social Worker

## 2021-09-06 ENCOUNTER — Ambulatory Visit
Admission: RE | Admit: 2021-09-06 | Discharge: 2021-09-06 | Disposition: A | Discharge status: Home / Self Care | Payer: Medicare Other | Source: Ambulatory Visit | Attending: Radiation Oncology | Admitting: Radiation Oncology

## 2021-09-06 ENCOUNTER — Encounter: Payer: Self-pay | Admitting: *Deleted

## 2021-09-06 ENCOUNTER — Encounter: Payer: Self-pay | Admitting: Radiation Oncology

## 2021-09-06 VITALS — BP 128/74 | HR 75 | Temp 97.0°F | Resp 18 | Ht 66.0 in | Wt 145.2 lb

## 2021-09-06 DIAGNOSIS — K219 Gastro-esophageal reflux disease without esophagitis: Secondary | ICD-10-CM | POA: Insufficient documentation

## 2021-09-06 DIAGNOSIS — M79662 Pain in left lower leg: Secondary | ICD-10-CM | POA: Insufficient documentation

## 2021-09-06 DIAGNOSIS — M79661 Pain in right lower leg: Secondary | ICD-10-CM | POA: Insufficient documentation

## 2021-09-06 DIAGNOSIS — Z803 Family history of malignant neoplasm of breast: Secondary | ICD-10-CM | POA: Insufficient documentation

## 2021-09-06 DIAGNOSIS — C50412 Malignant neoplasm of upper-outer quadrant of left female breast: Secondary | ICD-10-CM | POA: Diagnosis not present

## 2021-09-06 DIAGNOSIS — Z1379 Encounter for other screening for genetic and chromosomal anomalies: Secondary | ICD-10-CM | POA: Insufficient documentation

## 2021-09-06 DIAGNOSIS — Z801 Family history of malignant neoplasm of trachea, bronchus and lung: Secondary | ICD-10-CM | POA: Diagnosis not present

## 2021-09-06 DIAGNOSIS — I10 Essential (primary) hypertension: Secondary | ICD-10-CM | POA: Insufficient documentation

## 2021-09-06 DIAGNOSIS — E7800 Pure hypercholesterolemia, unspecified: Secondary | ICD-10-CM | POA: Diagnosis not present

## 2021-09-06 DIAGNOSIS — Z171 Estrogen receptor negative status [ER-]: Secondary | ICD-10-CM | POA: Insufficient documentation

## 2021-09-06 DIAGNOSIS — M6281 Muscle weakness (generalized): Secondary | ICD-10-CM | POA: Diagnosis not present

## 2021-09-06 DIAGNOSIS — Z9012 Acquired absence of left breast and nipple: Secondary | ICD-10-CM | POA: Insufficient documentation

## 2021-09-06 DIAGNOSIS — Z79899 Other long term (current) drug therapy: Secondary | ICD-10-CM | POA: Insufficient documentation

## 2021-09-06 DIAGNOSIS — M25612 Stiffness of left shoulder, not elsewhere classified: Secondary | ICD-10-CM | POA: Diagnosis not present

## 2021-09-06 DIAGNOSIS — M25512 Pain in left shoulder: Secondary | ICD-10-CM | POA: Diagnosis not present

## 2021-09-06 DIAGNOSIS — Z800 Family history of malignant neoplasm of digestive organs: Secondary | ICD-10-CM | POA: Insufficient documentation

## 2021-09-06 DIAGNOSIS — Z7982 Long term (current) use of aspirin: Secondary | ICD-10-CM | POA: Insufficient documentation

## 2021-09-06 DIAGNOSIS — C50912 Malignant neoplasm of unspecified site of left female breast: Secondary | ICD-10-CM | POA: Diagnosis not present

## 2021-09-06 DIAGNOSIS — M79622 Pain in left upper arm: Secondary | ICD-10-CM | POA: Diagnosis not present

## 2021-09-06 DIAGNOSIS — Z8673 Personal history of transient ischemic attack (TIA), and cerebral infarction without residual deficits: Secondary | ICD-10-CM | POA: Diagnosis not present

## 2021-09-06 DIAGNOSIS — C773 Secondary and unspecified malignant neoplasm of axilla and upper limb lymph nodes: Secondary | ICD-10-CM | POA: Diagnosis not present

## 2021-09-06 NOTE — Progress Notes (Signed)
HPI:  Tina Ellis was previously seen in the [place] clinic due to a personal and family history of cancer and concerns regarding a hereditary predisposition to cancer. Please refer to our prior cancer genetics clinic note for more information regarding our discussion, assessment and recommendations, at the time. Tina Ellis recent genetic test results were disclosed to her, as were recommendations warranted by these results. These results and recommendations are discussed in more detail below.  CANCER HISTORY:  Oncology History  Malignant neoplasm of upper-outer quadrant of left breast in female, estrogen receptor negative ([place])  06/23/2021 Initial Diagnosis   Patient felt left axillary lump which led to mammogram and ultrasound that revealed 4.1 cm left breast mass with a 6.7 cm left axillary lymph node.  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  Lymphovascular invasion noted, ER 0%, PR 0%, HER2 negative   07/12/2021 Cancer Staging   Staging form: Breast, AJCC 8th Edition - Clinical: Stage IIIC (cT2, cN2, cM0, G3, ER-, PR-, HER2-) - Signed by Nicholas Lose, MD on 07/12/2021 Histologic grading system: 3 grade system   07/21/2021 - 07/21/2021 Chemotherapy   1 cycle of Taxol carboplatin and Keytruda.  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 The report date is 08/25/2021.  The Multi-Cancer Panel + RNA offered by [facility] includes sequencing and/or deletion duplication testing of the following 84 genes: AIP, ALK, APC, ATM, AXIN2,BAP1,  BARD1, BLM, BMPR1A, BRCA1, BRCA2, BRIP1, CASR, CDC73, CDH1, CDK4, CDKN1B, CDKN1C, CDKN2A  (p14ARF), CDKN2A (p16INK4a), CEBPA, CHEK2, CTNNA1, DICER1, DIS3L2, EGFR (c.2369C>T, p.Thr790Met variant only), EPCAM (Deletion/duplication testing only), FH, FLCN, GATA2, GPC3, GREM1 (Promoter region deletion/duplication testing only), HOXB13 (c.251G>A, p.Gly84Glu), HRAS, KIT, MAX, MEN1, MET, MITF (c.952G>A, p.Glu318Lys variant only), MLH1, MSH2, MSH3, MSH6, MUTYH, NBN, NF1, NF2, NTHL1, PALB2, PDGFRA, PHOX2B, PMS2, POLD1, POLE, POT1, PRKAR1A, PTCH1, PTEN, RAD50, RAD51C, RAD51D, RB1, RECQL4, RET, RUNX1, SDHAF2, SDHA (sequence changes only), SDHB, SDHC, SDHD, SMAD4, SMARCA4, SMARCB1, SMARCE1, STK11, SUFU, TERC, TERT, TMEM127, TP53, TSC1, TSC2, VHL, WRN and WT1.     FAMILY HISTORY:  We obtained a detailed, 4-generation family history.  Significant diagnoses are listed below: Family History  Problem Relation Age of Onset   Colon cancer Mother    Lung cancer Father    Crohn's disease Brother    Tina Ellis has 2 sons. She has granddaughter and great-granddaughters. She had 1 brother, he passed around age 50, no cancers.   Tina Ellis's mother died at 45 and had colon cancer. Patient had 2 maternal aunts. One had breast cancer late in life, she died in her 45s. The other aunt had a cancer, unknown type. No known cancers in maternal cousins or grandparents.    Tina Ellis's father died at 25 of lung cancer, he had history of smoking. He was an only child. No known cancers in patient's paternal grandparents.    Tina Ellis is unaware of previous family history of genetic testing for hereditary cancer risks. Patient's maternal ancestors are of English/unknown descent, and paternal ancestors are of unknown descent. There is no reported Ashkenazi Jewish ancestry. There is no known consanguinity.       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 The remainder of testing was normal.   The Multi-Cancer Panel + RNA offered by [facility] includes sequencing and/or deletion duplication testing of the following 84 genes: AIP, ALK, APC, ATM, AXIN2,BAP1,  BARD1, BLM, BMPR1A, BRCA1, BRCA2, BRIP1, CASR, CDC73, CDH1, CDK4, CDKN1B, CDKN1C, CDKN2A (p14ARF), CDKN2A (p16INK4a), CEBPA, CHEK2, CTNNA1, DICER1, DIS3L2, EGFR (c.2369C>T, p.Thr790Met variant only), EPCAM (Deletion/duplication testing only), FH, FLCN, GATA2, GPC3, GREM1 (Promoter region deletion/duplication testing only), HOXB13 (c.251G>A, p.Gly84Glu), HRAS, KIT, MAX, MEN1, MET, MITF (c.952G>A, p.Glu318Lys variant only), MLH1, MSH2, MSH3, MSH6, MUTYH, NBN, NF1, NF2, NTHL1, PALB2, PDGFRA, PHOX2B, PMS2, POLD1, POLE, POT1, PRKAR1A, PTCH1, PTEN, RAD50, RAD51C, RAD51D, RB1, RECQL4, RET, RUNX1, SDHAF2, SDHA (sequence changes only), SDHB, SDHC, SDHD, SMAD4, SMARCA4, SMARCB1, SMARCE1, STK11, SUFU, TERC, TERT, TMEM127, TP53, TSC1, TSC2, VHL, WRN and WT1.   The test report has been scanned into EPIC and is located under the Molecular Pathology section of the Results Review tab.  A portion of the result report is included below for reference.     We discussed that because current genetic testing is not perfect, it is possible there may be a gene mutation in one of these genes that current testing cannot detect, but that chance is small.  There could be another gene that has not yet been discovered, or that we have not yet tested, that is responsible for the cancer diagnoses in the family. It is also possible there is a hereditary cause for the cancer in the family that Tina Ellis did not inherit and therefore was not identified in her testing.  Therefore, it is important to remain in touch with cancer genetics in the future so that we can continue to offer Tina Ellis the most up to date genetic testing.   Genetic testing did identify a variant of uncertain  significance (VUS) in the RAD51C gene.  At this time, it is unknown if this variant is associated with increased cancer risk or if this is a normal finding, but most variants such as this get reclassified to being inconsequential. It should not be used to make medical management decisions. With time, we suspect the lab will determine the significance of this variant, if any. If we do learn more about it we will try to contact Tina Ellis to discuss it further. However, it is important to stay in touch with [place] periodically and keep the address and phone number up to date.  ADDITIONAL GENETIC TESTING: We discussed with Tina Ellis that her genetic testing was fairly extensive.  If there are genes identified to increase cancer risk that can be analyzed in the future, we would be happy to discuss and coordinate this testing at that time.    CANCER SCREENING RECOMMENDATIONS: Tina Ellis test result is considered essentially normal. This means that we have not identified a hereditary cause for her personal and family history of cancer at this time. Most cancers happen by chance and this negative test suggests that her cancer may fall into this category.    While reassuring, this does not definitively rule out a hereditary predisposition to cancer. It is still possible that there could be genetic mutations that are undetectable by current technology. There could be genetic mutations in genes that have not been tested or identified to increase cancer risk.  Therefore, it is  recommended she continue to follow the cancer management and screening guidelines provided by her oncology and primary healthcare provider.   An individual's cancer risk and medical management are not determined by genetic test results alone. Overall cancer risk assessment incorporates additional factors, including personal medical history, family history, and any available genetic information that may result in a personalized plan for cancer  prevention and surveillance.  RECOMMENDATIONS FOR FAMILY MEMBERS:  Relatives in this family might be at some increased risk of developing cancer, over the general population risk, simply due to the family history of cancer.  We recommended female relatives in this family have a yearly mammogram beginning at age 103, or 62 years younger than the earliest onset of cancer, an annual clinical breast exam, and perform monthly breast self-exams. Female relatives in this family should also have a gynecological exam as recommended by their primary provider.  All family members should be referred for colonoscopy starting at age 41.   FOLLOW-UP: Lastly, we discussed with Tina Ellis that cancer genetics is a rapidly advancing field and it is possible that new genetic tests will be appropriate for her and/or her family members in the future. We encouraged her to remain in contact with cancer genetics on an annual basis so we can update her personal and family histories and let her know of advances in cancer genetics that may benefit this family.   Our contact number was provided. Tina Ellis questions were answered to her satisfaction, and she knows she is welcome to call us at anytime with additional questions or concerns.   Faith Rogue, MS, [facility] Genetic Counselor [email or web address] Phone: [phone]

## 2021-09-12 ENCOUNTER — Encounter: Payer: Self-pay | Admitting: *Deleted

## 2021-09-26 ENCOUNTER — Other Ambulatory Visit: Payer: Self-pay | Admitting: *Deleted

## 2021-09-26 ENCOUNTER — Inpatient Hospital Stay: Payer: Medicare Other | Attending: Hematology and Oncology

## 2021-09-26 ENCOUNTER — Other Ambulatory Visit: Payer: Self-pay

## 2021-09-26 DIAGNOSIS — Z452 Encounter for adjustment and management of vascular access device: Secondary | ICD-10-CM | POA: Diagnosis not present

## 2021-09-26 DIAGNOSIS — C50412 Malignant neoplasm of upper-outer quadrant of left female breast: Secondary | ICD-10-CM | POA: Insufficient documentation

## 2021-09-26 DIAGNOSIS — Z95828 Presence of other vascular implants and grafts: Secondary | ICD-10-CM

## 2021-09-26 DIAGNOSIS — Z171 Estrogen receptor negative status [ER-]: Secondary | ICD-10-CM | POA: Diagnosis not present

## 2021-09-26 MED ORDER — SODIUM CHLORIDE 0.9% FLUSH
10.0000 mL | Freq: Once | INTRAVENOUS | Status: AC
  Administered 2021-09-26: 10 mL

## 2021-09-26 MED ORDER — HEPARIN SOD (PORK) LOCK FLUSH 100 UNIT/ML IV SOLN
500.0000 [IU] | Freq: Once | INTRAVENOUS | Status: AC
  Administered 2021-09-26: 500 [IU]

## 2021-09-27 ENCOUNTER — Telehealth: Payer: Self-pay | Admitting: Hematology and Oncology

## 2021-09-27 NOTE — Telephone Encounter (Signed)
Scheduled appts per 10/3 sch msg, was unable to contact pt, will mail calender to her

## 2021-09-28 DIAGNOSIS — R29898 Other symptoms and signs involving the musculoskeletal system: Secondary | ICD-10-CM | POA: Diagnosis not present

## 2021-09-28 DIAGNOSIS — M25512 Pain in left shoulder: Secondary | ICD-10-CM | POA: Diagnosis not present

## 2021-09-30 ENCOUNTER — Telehealth: Payer: Self-pay | Admitting: *Deleted

## 2021-09-30 NOTE — Telephone Encounter (Signed)
Received call from pt with complaint of diarrhea x2 days.  Pt states she is able to tolerate p.o fluids and solids.  Pt requesting advice from MD.  Per MD pt to take OTC imodium as directed on the back of the box and to eat a bland diet with chicken and rice.  Pt also educated to f/u with PCP.  Pt verbalized understanding and appreciative of advice.

## 2021-10-03 ENCOUNTER — Ambulatory Visit
Admission: RE | Admit: 2021-10-03 | Discharge: 2021-10-03 | Disposition: A | Discharge status: Home / Self Care | Payer: Medicare Other | Source: Ambulatory Visit | Attending: Radiation Oncology | Admitting: Radiation Oncology

## 2021-10-03 ENCOUNTER — Other Ambulatory Visit: Payer: Self-pay

## 2021-10-03 DIAGNOSIS — Z510 Encounter for antineoplastic radiation therapy: Secondary | ICD-10-CM | POA: Insufficient documentation

## 2021-10-03 DIAGNOSIS — Z171 Estrogen receptor negative status [ER-]: Secondary | ICD-10-CM | POA: Diagnosis not present

## 2021-10-03 DIAGNOSIS — C50412 Malignant neoplasm of upper-outer quadrant of left female breast: Secondary | ICD-10-CM | POA: Diagnosis not present

## 2021-10-03 DIAGNOSIS — C773 Secondary and unspecified malignant neoplasm of axilla and upper limb lymph nodes: Secondary | ICD-10-CM | POA: Insufficient documentation

## 2021-10-07 ENCOUNTER — Ambulatory Visit: Payer: Medicare Other | Admitting: Radiation Oncology

## 2021-10-07 DIAGNOSIS — C773 Secondary and unspecified malignant neoplasm of axilla and upper limb lymph nodes: Secondary | ICD-10-CM | POA: Diagnosis not present

## 2021-10-07 DIAGNOSIS — Z510 Encounter for antineoplastic radiation therapy: Secondary | ICD-10-CM | POA: Diagnosis not present

## 2021-10-07 DIAGNOSIS — C50412 Malignant neoplasm of upper-outer quadrant of left female breast: Secondary | ICD-10-CM | POA: Diagnosis not present

## 2021-10-07 DIAGNOSIS — Z171 Estrogen receptor negative status [ER-]: Secondary | ICD-10-CM | POA: Diagnosis not present

## 2021-10-10 ENCOUNTER — Ambulatory Visit: Payer: Medicare Other

## 2021-10-10 ENCOUNTER — Encounter: Payer: Self-pay | Admitting: *Deleted

## 2021-10-10 ENCOUNTER — Ambulatory Visit
Admission: RE | Admit: 2021-10-10 | Discharge: 2021-10-10 | Disposition: A | Discharge status: Home / Self Care | Payer: Medicare Other | Source: Ambulatory Visit | Attending: Radiation Oncology | Admitting: Radiation Oncology

## 2021-10-10 ENCOUNTER — Ambulatory Visit: Payer: Medicare Other | Admitting: Radiation Oncology

## 2021-10-10 ENCOUNTER — Other Ambulatory Visit: Payer: Self-pay

## 2021-10-10 DIAGNOSIS — Z171 Estrogen receptor negative status [ER-]: Secondary | ICD-10-CM | POA: Diagnosis not present

## 2021-10-10 DIAGNOSIS — C773 Secondary and unspecified malignant neoplasm of axilla and upper limb lymph nodes: Secondary | ICD-10-CM | POA: Diagnosis not present

## 2021-10-10 DIAGNOSIS — Z510 Encounter for antineoplastic radiation therapy: Secondary | ICD-10-CM | POA: Diagnosis not present

## 2021-10-10 DIAGNOSIS — C50412 Malignant neoplasm of upper-outer quadrant of left female breast: Secondary | ICD-10-CM | POA: Diagnosis not present

## 2021-10-10 MED ORDER — ALRA NON-METALLIC DEODORANT (RAD-ONC)
1.0000 "application " | Freq: Once | TOPICAL | Status: AC
  Administered 2021-10-10: 1 via TOPICAL

## 2021-10-10 MED ORDER — RADIAPLEXRX EX GEL: Freq: Once | CUTANEOUS | Status: AC

## 2021-10-10 NOTE — Progress Notes (Signed)
Pt here for patient teaching.  Pt given Radiation and You booklet, Managing Acute Radiation Side Effects for Head and Neck Cancer handout, skin care instructions, Alra deodorant, and Radiaplex gel.  Reviewed areas of pertinence such as fatigue, hair loss, skin changes, and breast swelling . Pt able to give teach back of to pat skin and use unscented/gentle soap,apply Radiaplex bid, avoid applying anything to skin within 4 hours of treatment, avoid wearing an under wire bra, and to use an electric razor if they must shave. Pt demonstrated understanding of information given and will contact nursing with any questions or concerns.     [email or web address]

## 2021-10-11 ENCOUNTER — Ambulatory Visit: Payer: Medicare Other | Admitting: Radiation Oncology

## 2021-10-11 ENCOUNTER — Ambulatory Visit: Payer: Medicare Other

## 2021-10-11 ENCOUNTER — Encounter: Payer: Self-pay | Admitting: Hematology and Oncology

## 2021-10-11 ENCOUNTER — Ambulatory Visit
Admission: RE | Admit: 2021-10-11 | Discharge: 2021-10-11 | Disposition: A | Discharge status: Home / Self Care | Payer: Medicare Other | Source: Ambulatory Visit | Attending: Radiation Oncology | Admitting: Radiation Oncology

## 2021-10-11 DIAGNOSIS — C773 Secondary and unspecified malignant neoplasm of axilla and upper limb lymph nodes: Secondary | ICD-10-CM | POA: Diagnosis not present

## 2021-10-11 DIAGNOSIS — C50412 Malignant neoplasm of upper-outer quadrant of left female breast: Secondary | ICD-10-CM | POA: Diagnosis not present

## 2021-10-11 DIAGNOSIS — Z510 Encounter for antineoplastic radiation therapy: Secondary | ICD-10-CM | POA: Diagnosis not present

## 2021-10-11 DIAGNOSIS — Z171 Estrogen receptor negative status [ER-]: Secondary | ICD-10-CM | POA: Diagnosis not present

## 2021-10-12 ENCOUNTER — Ambulatory Visit: Payer: Medicare Other

## 2021-10-12 ENCOUNTER — Other Ambulatory Visit: Payer: Self-pay

## 2021-10-12 ENCOUNTER — Ambulatory Visit: Payer: Medicare Other | Admitting: Radiation Oncology

## 2021-10-12 ENCOUNTER — Ambulatory Visit
Admission: RE | Admit: 2021-10-12 | Discharge: 2021-10-12 | Disposition: A | Discharge status: Home / Self Care | Payer: Medicare Other | Source: Ambulatory Visit | Attending: Radiation Oncology | Admitting: Radiation Oncology

## 2021-10-12 DIAGNOSIS — Z510 Encounter for antineoplastic radiation therapy: Secondary | ICD-10-CM | POA: Diagnosis not present

## 2021-10-12 DIAGNOSIS — Z171 Estrogen receptor negative status [ER-]: Secondary | ICD-10-CM | POA: Diagnosis not present

## 2021-10-12 DIAGNOSIS — C773 Secondary and unspecified malignant neoplasm of axilla and upper limb lymph nodes: Secondary | ICD-10-CM | POA: Diagnosis not present

## 2021-10-12 DIAGNOSIS — C50412 Malignant neoplasm of upper-outer quadrant of left female breast: Secondary | ICD-10-CM | POA: Diagnosis not present

## 2021-10-13 ENCOUNTER — Ambulatory Visit
Admission: RE | Admit: 2021-10-13 | Discharge: 2021-10-13 | Disposition: A | Discharge status: Home / Self Care | Payer: Medicare Other | Source: Ambulatory Visit | Attending: Radiation Oncology | Admitting: Radiation Oncology

## 2021-10-13 ENCOUNTER — Ambulatory Visit: Payer: Medicare Other

## 2021-10-13 ENCOUNTER — Ambulatory Visit: Payer: Medicare Other | Admitting: Radiation Oncology

## 2021-10-13 DIAGNOSIS — Z510 Encounter for antineoplastic radiation therapy: Secondary | ICD-10-CM | POA: Diagnosis not present

## 2021-10-13 DIAGNOSIS — Z171 Estrogen receptor negative status [ER-]: Secondary | ICD-10-CM | POA: Diagnosis not present

## 2021-10-13 DIAGNOSIS — C773 Secondary and unspecified malignant neoplasm of axilla and upper limb lymph nodes: Secondary | ICD-10-CM | POA: Diagnosis not present

## 2021-10-13 DIAGNOSIS — C50412 Malignant neoplasm of upper-outer quadrant of left female breast: Secondary | ICD-10-CM | POA: Diagnosis not present

## 2021-10-14 ENCOUNTER — Ambulatory Visit: Payer: Medicare Other

## 2021-10-14 ENCOUNTER — Ambulatory Visit: Payer: Medicare Other | Admitting: Radiation Oncology

## 2021-10-14 ENCOUNTER — Ambulatory Visit
Admission: RE | Admit: 2021-10-14 | Discharge: 2021-10-14 | Disposition: A | Discharge status: Home / Self Care | Payer: Medicare Other | Source: Ambulatory Visit | Attending: Radiation Oncology | Admitting: Radiation Oncology

## 2021-10-14 ENCOUNTER — Other Ambulatory Visit: Payer: Self-pay

## 2021-10-14 DIAGNOSIS — C773 Secondary and unspecified malignant neoplasm of axilla and upper limb lymph nodes: Secondary | ICD-10-CM | POA: Diagnosis not present

## 2021-10-14 DIAGNOSIS — C50412 Malignant neoplasm of upper-outer quadrant of left female breast: Secondary | ICD-10-CM | POA: Diagnosis not present

## 2021-10-14 DIAGNOSIS — Z510 Encounter for antineoplastic radiation therapy: Secondary | ICD-10-CM | POA: Diagnosis not present

## 2021-10-14 DIAGNOSIS — Z171 Estrogen receptor negative status [ER-]: Secondary | ICD-10-CM | POA: Diagnosis not present

## 2021-10-17 ENCOUNTER — Ambulatory Visit: Payer: Medicare Other

## 2021-10-17 ENCOUNTER — Ambulatory Visit: Payer: Medicare Other | Admitting: Radiation Oncology

## 2021-10-17 ENCOUNTER — Other Ambulatory Visit: Payer: Self-pay

## 2021-10-17 ENCOUNTER — Ambulatory Visit
Admission: RE | Admit: 2021-10-17 | Discharge: 2021-10-17 | Disposition: A | Discharge status: Home / Self Care | Payer: Medicare Other | Source: Ambulatory Visit | Attending: Radiation Oncology | Admitting: Radiation Oncology

## 2021-10-17 DIAGNOSIS — C773 Secondary and unspecified malignant neoplasm of axilla and upper limb lymph nodes: Secondary | ICD-10-CM | POA: Diagnosis not present

## 2021-10-17 DIAGNOSIS — C50412 Malignant neoplasm of upper-outer quadrant of left female breast: Secondary | ICD-10-CM | POA: Diagnosis not present

## 2021-10-17 DIAGNOSIS — Z510 Encounter for antineoplastic radiation therapy: Secondary | ICD-10-CM | POA: Diagnosis not present

## 2021-10-17 DIAGNOSIS — Z171 Estrogen receptor negative status [ER-]: Secondary | ICD-10-CM | POA: Diagnosis not present

## 2021-10-18 ENCOUNTER — Ambulatory Visit: Payer: Medicare Other | Admitting: Radiation Oncology

## 2021-10-18 ENCOUNTER — Ambulatory Visit: Payer: Medicare Other

## 2021-10-18 ENCOUNTER — Other Ambulatory Visit: Payer: Self-pay

## 2021-10-18 ENCOUNTER — Ambulatory Visit
Admission: RE | Admit: 2021-10-18 | Discharge: 2021-10-18 | Disposition: A | Discharge status: Home / Self Care | Payer: Medicare Other | Source: Ambulatory Visit | Attending: Radiation Oncology | Admitting: Radiation Oncology

## 2021-10-18 DIAGNOSIS — C773 Secondary and unspecified malignant neoplasm of axilla and upper limb lymph nodes: Secondary | ICD-10-CM | POA: Diagnosis not present

## 2021-10-18 DIAGNOSIS — Z510 Encounter for antineoplastic radiation therapy: Secondary | ICD-10-CM | POA: Diagnosis not present

## 2021-10-18 DIAGNOSIS — Z171 Estrogen receptor negative status [ER-]: Secondary | ICD-10-CM | POA: Diagnosis not present

## 2021-10-18 DIAGNOSIS — C50412 Malignant neoplasm of upper-outer quadrant of left female breast: Secondary | ICD-10-CM | POA: Diagnosis not present

## 2021-10-19 ENCOUNTER — Ambulatory Visit
Admission: RE | Admit: 2021-10-19 | Discharge: 2021-10-19 | Disposition: A | Discharge status: Home / Self Care | Payer: Medicare Other | Source: Ambulatory Visit | Attending: Radiation Oncology | Admitting: Radiation Oncology

## 2021-10-19 ENCOUNTER — Ambulatory Visit: Payer: Medicare Other | Admitting: Radiation Oncology

## 2021-10-19 ENCOUNTER — Ambulatory Visit: Payer: Medicare Other

## 2021-10-19 DIAGNOSIS — C50412 Malignant neoplasm of upper-outer quadrant of left female breast: Secondary | ICD-10-CM | POA: Diagnosis not present

## 2021-10-19 DIAGNOSIS — Z171 Estrogen receptor negative status [ER-]: Secondary | ICD-10-CM | POA: Diagnosis not present

## 2021-10-19 DIAGNOSIS — C773 Secondary and unspecified malignant neoplasm of axilla and upper limb lymph nodes: Secondary | ICD-10-CM | POA: Diagnosis not present

## 2021-10-19 DIAGNOSIS — Z510 Encounter for antineoplastic radiation therapy: Secondary | ICD-10-CM | POA: Diagnosis not present

## 2021-10-20 ENCOUNTER — Ambulatory Visit: Payer: Medicare Other

## 2021-10-20 ENCOUNTER — Ambulatory Visit
Admission: RE | Admit: 2021-10-20 | Discharge: 2021-10-20 | Disposition: A | Discharge status: Home / Self Care | Payer: Medicare Other | Source: Ambulatory Visit | Attending: Radiation Oncology | Admitting: Radiation Oncology

## 2021-10-20 ENCOUNTER — Other Ambulatory Visit: Payer: Self-pay

## 2021-10-20 ENCOUNTER — Ambulatory Visit: Payer: Medicare Other | Admitting: Radiation Oncology

## 2021-10-20 DIAGNOSIS — C773 Secondary and unspecified malignant neoplasm of axilla and upper limb lymph nodes: Secondary | ICD-10-CM | POA: Diagnosis not present

## 2021-10-20 DIAGNOSIS — C50412 Malignant neoplasm of upper-outer quadrant of left female breast: Secondary | ICD-10-CM | POA: Diagnosis not present

## 2021-10-20 DIAGNOSIS — Z171 Estrogen receptor negative status [ER-]: Secondary | ICD-10-CM | POA: Diagnosis not present

## 2021-10-20 DIAGNOSIS — Z510 Encounter for antineoplastic radiation therapy: Secondary | ICD-10-CM | POA: Diagnosis not present

## 2021-10-21 ENCOUNTER — Ambulatory Visit: Payer: Medicare Other

## 2021-10-21 ENCOUNTER — Ambulatory Visit: Payer: Medicare Other | Admitting: Radiation Oncology

## 2021-10-21 ENCOUNTER — Ambulatory Visit
Admission: RE | Admit: 2021-10-21 | Discharge: 2021-10-21 | Disposition: A | Discharge status: Home / Self Care | Payer: Medicare Other | Source: Ambulatory Visit | Attending: Radiation Oncology | Admitting: Radiation Oncology

## 2021-10-21 ENCOUNTER — Other Ambulatory Visit: Payer: Self-pay

## 2021-10-21 DIAGNOSIS — C50412 Malignant neoplasm of upper-outer quadrant of left female breast: Secondary | ICD-10-CM | POA: Diagnosis not present

## 2021-10-21 DIAGNOSIS — Z171 Estrogen receptor negative status [ER-]: Secondary | ICD-10-CM | POA: Diagnosis not present

## 2021-10-21 DIAGNOSIS — Z510 Encounter for antineoplastic radiation therapy: Secondary | ICD-10-CM | POA: Diagnosis not present

## 2021-10-21 DIAGNOSIS — C773 Secondary and unspecified malignant neoplasm of axilla and upper limb lymph nodes: Secondary | ICD-10-CM | POA: Diagnosis not present

## 2021-10-24 ENCOUNTER — Ambulatory Visit: Payer: Medicare Other | Admitting: Radiation Oncology

## 2021-10-24 ENCOUNTER — Ambulatory Visit: Payer: Medicare Other

## 2021-10-24 ENCOUNTER — Other Ambulatory Visit: Payer: Self-pay

## 2021-10-24 ENCOUNTER — Ambulatory Visit
Admission: RE | Admit: 2021-10-24 | Discharge: 2021-10-24 | Disposition: A | Discharge status: Home / Self Care | Payer: Medicare Other | Source: Ambulatory Visit | Attending: Radiation Oncology | Admitting: Radiation Oncology

## 2021-10-24 DIAGNOSIS — C773 Secondary and unspecified malignant neoplasm of axilla and upper limb lymph nodes: Secondary | ICD-10-CM | POA: Diagnosis not present

## 2021-10-24 DIAGNOSIS — C50412 Malignant neoplasm of upper-outer quadrant of left female breast: Secondary | ICD-10-CM | POA: Diagnosis not present

## 2021-10-24 DIAGNOSIS — Z171 Estrogen receptor negative status [ER-]: Secondary | ICD-10-CM | POA: Diagnosis not present

## 2021-10-24 DIAGNOSIS — Z510 Encounter for antineoplastic radiation therapy: Secondary | ICD-10-CM | POA: Diagnosis not present

## 2021-10-25 ENCOUNTER — Ambulatory Visit
Admission: RE | Admit: 2021-10-25 | Discharge: 2021-10-25 | Disposition: A | Discharge status: Home / Self Care | Payer: Medicare Other | Source: Ambulatory Visit | Attending: Radiation Oncology | Admitting: Radiation Oncology

## 2021-10-25 ENCOUNTER — Ambulatory Visit: Payer: Medicare Other

## 2021-10-25 ENCOUNTER — Ambulatory Visit: Payer: Medicare Other | Admitting: Radiation Oncology

## 2021-10-25 DIAGNOSIS — Z510 Encounter for antineoplastic radiation therapy: Secondary | ICD-10-CM | POA: Diagnosis not present

## 2021-10-25 DIAGNOSIS — Z171 Estrogen receptor negative status [ER-]: Secondary | ICD-10-CM | POA: Insufficient documentation

## 2021-10-25 DIAGNOSIS — C50412 Malignant neoplasm of upper-outer quadrant of left female breast: Secondary | ICD-10-CM | POA: Insufficient documentation

## 2021-10-26 ENCOUNTER — Ambulatory Visit
Admission: RE | Admit: 2021-10-26 | Discharge: 2021-10-26 | Disposition: A | Discharge status: Home / Self Care | Payer: Medicare Other | Source: Ambulatory Visit | Attending: Radiation Oncology | Admitting: Radiation Oncology

## 2021-10-26 ENCOUNTER — Ambulatory Visit: Payer: Medicare Other | Admitting: Radiation Oncology

## 2021-10-26 ENCOUNTER — Ambulatory Visit: Payer: Medicare Other

## 2021-10-26 ENCOUNTER — Other Ambulatory Visit: Payer: Self-pay

## 2021-10-26 DIAGNOSIS — Z510 Encounter for antineoplastic radiation therapy: Secondary | ICD-10-CM | POA: Diagnosis not present

## 2021-10-26 DIAGNOSIS — Z171 Estrogen receptor negative status [ER-]: Secondary | ICD-10-CM | POA: Diagnosis not present

## 2021-10-26 DIAGNOSIS — C50412 Malignant neoplasm of upper-outer quadrant of left female breast: Secondary | ICD-10-CM | POA: Diagnosis not present

## 2021-10-27 ENCOUNTER — Ambulatory Visit: Payer: Medicare Other

## 2021-10-27 ENCOUNTER — Ambulatory Visit: Payer: Medicare Other | Admitting: Radiation Oncology

## 2021-10-27 ENCOUNTER — Ambulatory Visit
Admission: RE | Admit: 2021-10-27 | Discharge: 2021-10-27 | Disposition: A | Discharge status: Home / Self Care | Payer: Medicare Other | Source: Ambulatory Visit | Attending: Radiation Oncology | Admitting: Radiation Oncology

## 2021-10-27 DIAGNOSIS — Z510 Encounter for antineoplastic radiation therapy: Secondary | ICD-10-CM | POA: Diagnosis not present

## 2021-10-27 DIAGNOSIS — Z171 Estrogen receptor negative status [ER-]: Secondary | ICD-10-CM | POA: Diagnosis not present

## 2021-10-27 DIAGNOSIS — Z23 Encounter for immunization: Secondary | ICD-10-CM | POA: Diagnosis not present

## 2021-10-27 DIAGNOSIS — C50412 Malignant neoplasm of upper-outer quadrant of left female breast: Secondary | ICD-10-CM | POA: Diagnosis not present

## 2021-10-28 ENCOUNTER — Ambulatory Visit: Payer: Medicare Other | Admitting: Radiation Oncology

## 2021-10-28 ENCOUNTER — Ambulatory Visit: Payer: Medicare Other

## 2021-10-28 ENCOUNTER — Ambulatory Visit
Admission: RE | Admit: 2021-10-28 | Discharge: 2021-10-28 | Disposition: A | Discharge status: Home / Self Care | Payer: Medicare Other | Source: Ambulatory Visit | Attending: Radiation Oncology | Admitting: Radiation Oncology

## 2021-10-28 DIAGNOSIS — Z510 Encounter for antineoplastic radiation therapy: Secondary | ICD-10-CM | POA: Diagnosis not present

## 2021-10-28 DIAGNOSIS — Z171 Estrogen receptor negative status [ER-]: Secondary | ICD-10-CM | POA: Diagnosis not present

## 2021-10-28 DIAGNOSIS — C50412 Malignant neoplasm of upper-outer quadrant of left female breast: Secondary | ICD-10-CM | POA: Diagnosis not present

## 2021-10-31 ENCOUNTER — Ambulatory Visit
Admission: RE | Admit: 2021-10-31 | Discharge: 2021-10-31 | Disposition: A | Discharge status: Home / Self Care | Payer: Medicare Other | Source: Ambulatory Visit | Attending: Radiation Oncology | Admitting: Radiation Oncology

## 2021-10-31 ENCOUNTER — Ambulatory Visit: Payer: Medicare Other | Admitting: Radiation Oncology

## 2021-10-31 ENCOUNTER — Ambulatory Visit: Payer: Medicare Other

## 2021-10-31 ENCOUNTER — Other Ambulatory Visit: Payer: Self-pay

## 2021-10-31 DIAGNOSIS — Z171 Estrogen receptor negative status [ER-]: Secondary | ICD-10-CM | POA: Diagnosis not present

## 2021-10-31 DIAGNOSIS — Z510 Encounter for antineoplastic radiation therapy: Secondary | ICD-10-CM | POA: Diagnosis not present

## 2021-10-31 DIAGNOSIS — C50412 Malignant neoplasm of upper-outer quadrant of left female breast: Secondary | ICD-10-CM | POA: Diagnosis not present

## 2021-11-01 ENCOUNTER — Ambulatory Visit
Admission: RE | Admit: 2021-11-01 | Discharge: 2021-11-01 | Disposition: A | Discharge status: Home / Self Care | Payer: Medicare Other | Source: Ambulatory Visit | Attending: Radiation Oncology | Admitting: Radiation Oncology

## 2021-11-01 ENCOUNTER — Ambulatory Visit: Payer: Medicare Other | Admitting: Radiation Oncology

## 2021-11-01 ENCOUNTER — Ambulatory Visit: Payer: Medicare Other

## 2021-11-01 DIAGNOSIS — Z510 Encounter for antineoplastic radiation therapy: Secondary | ICD-10-CM | POA: Diagnosis not present

## 2021-11-01 DIAGNOSIS — C50412 Malignant neoplasm of upper-outer quadrant of left female breast: Secondary | ICD-10-CM | POA: Diagnosis not present

## 2021-11-01 DIAGNOSIS — Z171 Estrogen receptor negative status [ER-]: Secondary | ICD-10-CM | POA: Diagnosis not present

## 2021-11-02 ENCOUNTER — Ambulatory Visit
Admission: RE | Admit: 2021-11-02 | Discharge: 2021-11-02 | Disposition: A | Discharge status: Home / Self Care | Payer: Medicare Other | Source: Ambulatory Visit | Attending: Radiation Oncology | Admitting: Radiation Oncology

## 2021-11-02 ENCOUNTER — Ambulatory Visit: Payer: Medicare Other | Admitting: Radiation Oncology

## 2021-11-02 ENCOUNTER — Ambulatory Visit: Payer: Medicare Other

## 2021-11-02 ENCOUNTER — Other Ambulatory Visit: Payer: Self-pay

## 2021-11-02 DIAGNOSIS — Z171 Estrogen receptor negative status [ER-]: Secondary | ICD-10-CM | POA: Diagnosis not present

## 2021-11-02 DIAGNOSIS — C50412 Malignant neoplasm of upper-outer quadrant of left female breast: Secondary | ICD-10-CM | POA: Diagnosis not present

## 2021-11-02 DIAGNOSIS — Z510 Encounter for antineoplastic radiation therapy: Secondary | ICD-10-CM | POA: Diagnosis not present

## 2021-11-03 ENCOUNTER — Ambulatory Visit
Admission: RE | Admit: 2021-11-03 | Discharge: 2021-11-03 | Disposition: A | Discharge status: Home / Self Care | Payer: Medicare Other | Source: Ambulatory Visit | Attending: Radiation Oncology | Admitting: Radiation Oncology

## 2021-11-03 ENCOUNTER — Ambulatory Visit: Payer: Medicare Other

## 2021-11-03 ENCOUNTER — Ambulatory Visit: Payer: Medicare Other | Admitting: Radiation Oncology

## 2021-11-03 DIAGNOSIS — Z171 Estrogen receptor negative status [ER-]: Secondary | ICD-10-CM | POA: Diagnosis not present

## 2021-11-03 DIAGNOSIS — Z510 Encounter for antineoplastic radiation therapy: Secondary | ICD-10-CM | POA: Diagnosis not present

## 2021-11-03 DIAGNOSIS — C50412 Malignant neoplasm of upper-outer quadrant of left female breast: Secondary | ICD-10-CM | POA: Diagnosis not present

## 2021-11-04 ENCOUNTER — Ambulatory Visit: Payer: Medicare Other | Admitting: Radiation Oncology

## 2021-11-04 ENCOUNTER — Ambulatory Visit
Admission: RE | Admit: 2021-11-04 | Discharge: 2021-11-04 | Disposition: A | Discharge status: Home / Self Care | Payer: Medicare Other | Source: Ambulatory Visit | Attending: Radiation Oncology | Admitting: Radiation Oncology

## 2021-11-04 ENCOUNTER — Ambulatory Visit: Payer: Medicare Other

## 2021-11-04 ENCOUNTER — Other Ambulatory Visit: Payer: Self-pay

## 2021-11-04 DIAGNOSIS — C50412 Malignant neoplasm of upper-outer quadrant of left female breast: Secondary | ICD-10-CM | POA: Diagnosis not present

## 2021-11-04 DIAGNOSIS — Z510 Encounter for antineoplastic radiation therapy: Secondary | ICD-10-CM | POA: Diagnosis not present

## 2021-11-04 DIAGNOSIS — Z171 Estrogen receptor negative status [ER-]: Secondary | ICD-10-CM | POA: Diagnosis not present

## 2021-11-07 ENCOUNTER — Ambulatory Visit
Admission: RE | Admit: 2021-11-07 | Discharge: 2021-11-07 | Disposition: A | Discharge status: Home / Self Care | Payer: Medicare Other | Source: Ambulatory Visit | Attending: Radiation Oncology | Admitting: Radiation Oncology

## 2021-11-07 ENCOUNTER — Ambulatory Visit: Payer: Medicare Other | Admitting: Radiation Oncology

## 2021-11-07 ENCOUNTER — Ambulatory Visit: Payer: Medicare Other

## 2021-11-07 ENCOUNTER — Other Ambulatory Visit: Payer: Self-pay

## 2021-11-07 DIAGNOSIS — Z510 Encounter for antineoplastic radiation therapy: Secondary | ICD-10-CM | POA: Diagnosis not present

## 2021-11-07 DIAGNOSIS — Z171 Estrogen receptor negative status [ER-]: Secondary | ICD-10-CM | POA: Diagnosis not present

## 2021-11-07 DIAGNOSIS — C50412 Malignant neoplasm of upper-outer quadrant of left female breast: Secondary | ICD-10-CM

## 2021-11-07 MED ORDER — RADIAPLEXRX EX GEL: Freq: Once | CUTANEOUS | Status: AC

## 2021-11-08 ENCOUNTER — Ambulatory Visit: Payer: Medicare Other

## 2021-11-08 ENCOUNTER — Ambulatory Visit
Admission: RE | Admit: 2021-11-08 | Discharge: 2021-11-08 | Disposition: A | Discharge status: Home / Self Care | Payer: Medicare Other | Source: Ambulatory Visit | Attending: Radiation Oncology | Admitting: Radiation Oncology

## 2021-11-08 ENCOUNTER — Ambulatory Visit: Payer: Medicare Other | Admitting: Radiation Oncology

## 2021-11-08 DIAGNOSIS — Z510 Encounter for antineoplastic radiation therapy: Secondary | ICD-10-CM | POA: Diagnosis not present

## 2021-11-08 DIAGNOSIS — C50412 Malignant neoplasm of upper-outer quadrant of left female breast: Secondary | ICD-10-CM | POA: Diagnosis not present

## 2021-11-08 DIAGNOSIS — Z171 Estrogen receptor negative status [ER-]: Secondary | ICD-10-CM | POA: Diagnosis not present

## 2021-11-09 ENCOUNTER — Other Ambulatory Visit: Payer: Self-pay

## 2021-11-09 ENCOUNTER — Ambulatory Visit: Payer: Medicare Other

## 2021-11-09 ENCOUNTER — Ambulatory Visit
Admission: RE | Admit: 2021-11-09 | Discharge: 2021-11-09 | Disposition: A | Discharge status: Home / Self Care | Payer: Medicare Other | Source: Ambulatory Visit | Attending: Radiation Oncology | Admitting: Radiation Oncology

## 2021-11-09 ENCOUNTER — Ambulatory Visit: Payer: Medicare Other | Admitting: Radiation Oncology

## 2021-11-09 DIAGNOSIS — C50412 Malignant neoplasm of upper-outer quadrant of left female breast: Secondary | ICD-10-CM | POA: Diagnosis not present

## 2021-11-09 DIAGNOSIS — Z171 Estrogen receptor negative status [ER-]: Secondary | ICD-10-CM | POA: Diagnosis not present

## 2021-11-09 DIAGNOSIS — Z510 Encounter for antineoplastic radiation therapy: Secondary | ICD-10-CM | POA: Diagnosis not present

## 2021-11-09 NOTE — Progress Notes (Signed)
Patient Care Team: [name], MD as PCP - General (Internal Medicine) Mauro Kaufmann, RN as Oncology Nurse Navigator Rockwell Germany, RN as Oncology Nurse Navigator Nicholas Lose, MD as Consulting Physician (Hematology and Oncology)  DIAGNOSIS:    ICD-10-CM   1. Malignant neoplasm of upper-outer quadrant of left breast in female, estrogen receptor negative ([place])  C50.412    Z17.1       SUMMARY OF ONCOLOGIC HISTORY: Oncology History  Malignant neoplasm of upper-outer quadrant of left breast in female, estrogen receptor negative ([place])  06/23/2021 Initial Diagnosis   Patient felt left axillary lump which led to mammogram and ultrasound that revealed 4.1 cm left breast mass with a 6.7 cm left axillary lymph node.  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  Lymphovascular invasion noted, ER 0%, PR 0%, HER2 negative   07/12/2021 Cancer Staging   Staging form: Breast, AJCC 8th Edition - Clinical: Stage IIIC (cT2, cN2, cM0, G3, ER-, PR-, HER2-) - Signed by Nicholas Lose, MD on 07/12/2021 Histologic grading system: 3 grade system    07/21/2021 - 07/21/2021 Chemotherapy   1 cycle of Taxol carboplatin and Keytruda.  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 The report date is 08/25/2021.  The Multi-Cancer Panel + RNA offered by [facility] includes sequencing and/or deletion duplication testing of the following 84 genes: AIP, ALK, APC, ATM, AXIN2,BAP1,  BARD1, BLM, BMPR1A, BRCA1, BRCA2, BRIP1, CASR, CDC73, CDH1, CDK4, CDKN1B, CDKN1C, CDKN2A (p14ARF), CDKN2A (p16INK4a), CEBPA, CHEK2, CTNNA1, DICER1, DIS3L2, EGFR (c.2369C>T, p.Thr790Met  variant only), EPCAM (Deletion/duplication testing only), FH, FLCN, GATA2, GPC3, GREM1 (Promoter region deletion/duplication testing only), HOXB13 (c.251G>A, p.Gly84Glu), HRAS, KIT, MAX, MEN1, MET, MITF (c.952G>A, p.Glu318Lys variant only), MLH1, MSH2, MSH3, MSH6, MUTYH, NBN, NF1, NF2, NTHL1, PALB2, PDGFRA, PHOX2B, PMS2, POLD1, POLE, POT1, PRKAR1A, PTCH1, PTEN, RAD50, RAD51C, RAD51D, RB1, RECQL4, RET, RUNX1, SDHAF2, SDHA (sequence changes only), SDHB, SDHC, SDHD, SMAD4, SMARCA4, SMARCB1, SMARCE1, STK11, SUFU, TERC, TERT, TMEM127, TP53, TSC1, TSC2, VHL, WRN and WT1.     CHIEF COMPLIANT: Follow-up of left breast cancer  INTERVAL HISTORY: Tina Ellis is a 80 y.o. with above-mentioned history of left breast cancer having undergone left mastectomy, currently undergoing radiation therapy. She presents to the clinic today for follow-up.  Tomorrow she completes radiation therapy.  She appears to be feeling fatigued.  ALLERGIES:  is allergic to conjugated estrogens, fish oil, nitrofurantoin, benadryl itch stopping [diphenhydramine hcl], other, and pneumococcal 13-val conj vacc.  MEDICATIONS:  Current Outpatient Medications  Medication Sig Dispense Refill   amLODipine (NORVASC) 2.5 MG tablet Take 2.5 mg by mouth daily.     aspirin EC 81 MG EC tablet Take 1 tablet (81 mg total) by mouth daily.     carvedilol (COREG) 12.5 MG tablet Take 1 tablet (12.5 mg total) by mouth 2 (two) times daily with a meal. (Patient taking differently: Take 3.125 mg by mouth 2 (two) times daily with a meal.) 60 tablet 0   cetirizine (ZYRTEC) 10 MG tablet Take 10 mg by mouth at bedtime.     cholecalciferol (VITAMIN D) 1000 UNITS tablet Take 2,000 Units by mouth every morning.     hydrochlorothiazide (HYDRODIURIL) 12.5 MG tablet Take  12.5 mg by mouth daily.     losartan (COZAAR) 100 MG tablet Take 100 mg by mouth every morning.     losartan (COZAAR) 100 MG tablet Take 1 tablet by mouth daily.     omeprazole (PRILOSEC) 20 MG  capsule Take 20 mg by mouth every morning.      rosuvastatin (CRESTOR) 10 MG tablet Take 1 tablet (10 mg total) by mouth every evening. 30 tablet 0   No current facility-administered medications for this visit.    PHYSICAL EXAMINATION: ECOG PERFORMANCE STATUS: 1 - Symptomatic but completely ambulatory  Vitals:   11/10/21 1516  BP: 122/60  Pulse: 73  Resp: 18  Temp: 97.8 F (36.6 C)  SpO2: 100%   Filed Weights   11/10/21 1516  Weight: 145 lb 8 oz (66 kg)      LABORATORY DATA:  I have reviewed the data as listed CMP Latest Ref Rng & Units 07/26/2021 07/21/2021 07/15/2021  Glucose 70 - 99 mg/dL 193(H) 95 -  BUN 8 - 23 mg/dL 29(H) 21 -  Creatinine 0.44 - 1.00 mg/dL 0.98 0.98 0.90  Sodium 135 - 145 mmol/L 135 140 -  Potassium 3.5 - 5.1 mmol/L 3.6 4.0 -  Chloride 98 - 111 mmol/L 100 106 -  CO2 22 - 32 mmol/L 25 26 -  Calcium 8.9 - 10.3 mg/dL 9.2 9.8 -  Total Protein 6.5 - 8.1 g/dL 6.6 7.0 -  Total Bilirubin 0.3 - 1.2 mg/dL 1.0 0.5 -  Alkaline Phos 38 - 126 U/L 41 53 -  AST 15 - 41 U/L 20 15 -  ALT 0 - 44 U/L 11 <6 -    Lab Results  Component Value Date   WBC 5.4 07/26/2021   HGB 13.4 07/26/2021   HCT 38.6 07/26/2021   MCV 87.1 07/26/2021   PLT 234 07/26/2021   NEUTROABS 3.6 07/26/2021    ASSESSMENT & PLAN:  Malignant neoplasm of upper-outer quadrant of left breast in female, estrogen receptor negative ([place]) 06/23/2021: Patient felt left axillary lump which led to mammogram and ultrasound that revealed 4.1 cm left breast mass with a 6.7 cm left axillary lymph node.  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  Lymphovascular invasion noted, ER 0%, PR 0%, HER2 negative   Staging: CT abdomen pelvis 04/27/2021: No evidence of metastatic disease. CT head 04/27/2021: Performed for dizziness: Stable chronic small vessel ischemic changes    Recommendation 1.  Neoadjuvant chemotherapy and immunotherapy with Taxol carbo Keytruda  X  1 (discontinued for adverse effects) 2. followed by surgery with left mastectomy with ALND: At [facility]: Complete PR (Dr.Tjoe) 3.  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 The report date is 08/25/2021. ____________________________________________________ Return to clinic in 3 months for survivorship care plan visit We will alternate surveillance checkups between [place] and myself    No orders of the defined types were placed in this encounter.  The patient has a good understanding of the overall plan. she agrees with it. she will call with any problems that may develop before the next visit here.  Total time spent: 20 mins including face to face time and time spent for planning, charting and coordination of care  Rulon Eisenmenger, MD, MPH 11/10/2021  I, Thana Ates, am acting as  scribe for Dr. Kenry Daubert.  I have reviewed the above documentation for accuracy and completeness, and I agree with the above.       

## 2021-11-10 ENCOUNTER — Ambulatory Visit: Payer: Medicare Other | Admitting: Radiation Oncology

## 2021-11-10 ENCOUNTER — Ambulatory Visit: Payer: Medicare Other

## 2021-11-10 ENCOUNTER — Ambulatory Visit
Admission: RE | Admit: 2021-11-10 | Discharge: 2021-11-10 | Disposition: A | Discharge status: Home / Self Care | Payer: Medicare Other | Source: Ambulatory Visit | Attending: Radiation Oncology | Admitting: Radiation Oncology

## 2021-11-10 ENCOUNTER — Inpatient Hospital Stay: Payer: Medicare Other

## 2021-11-10 ENCOUNTER — Other Ambulatory Visit: Payer: Self-pay | Admitting: *Deleted

## 2021-11-10 ENCOUNTER — Encounter: Payer: Self-pay | Admitting: *Deleted

## 2021-11-10 ENCOUNTER — Inpatient Hospital Stay: Payer: Medicare Other | Attending: Hematology and Oncology | Admitting: Hematology and Oncology

## 2021-11-10 DIAGNOSIS — Z171 Estrogen receptor negative status [ER-]: Secondary | ICD-10-CM

## 2021-11-10 DIAGNOSIS — Z510 Encounter for antineoplastic radiation therapy: Secondary | ICD-10-CM | POA: Diagnosis not present

## 2021-11-10 DIAGNOSIS — C50412 Malignant neoplasm of upper-outer quadrant of left female breast: Secondary | ICD-10-CM

## 2021-11-10 DIAGNOSIS — Z95828 Presence of other vascular implants and grafts: Secondary | ICD-10-CM

## 2021-11-10 MED ORDER — SODIUM CHLORIDE 0.9% FLUSH
10.0000 mL | Freq: Once | INTRAVENOUS | Status: AC
  Administered 2021-11-10: 15:00:00 10 mL

## 2021-11-10 MED ORDER — HEPARIN SOD (PORK) LOCK FLUSH 100 UNIT/ML IV SOLN
500.0000 [IU] | Freq: Once | INTRAVENOUS | Status: AC
  Administered 2021-11-10: 15:00:00 500 [IU]

## 2021-11-10 NOTE — Assessment & Plan Note (Signed)
06/23/2021: Patient felt left axillary lump which led to mammogram and ultrasound that revealed 4.1 cm left breast mass with a 6.7 cm left axillary lymph node. 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 Lymphovascular invasion noted, ER 0%, PR 0%, HER2 negative  Staging: CT abdomen pelvis 04/27/2021: No evidence of metastatic disease. CT head 04/27/2021: Performed for dizziness: Stable chronic small vessel ischemic changes   Recommendation 1. Neoadjuvant chemotherapy and immunotherapy with Taxol carbo Keytruda  X 1 (discontinued for adverse effects) 2. followed by surgery with left mastectomy with ALND: At [facility]: Complete PR (Dr.Tjoe) 3. 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 The report date is 08/25/2021. ____________________________________________________ Return to clinic in 1 year for follow-up. We will alternate surveillance checkups between [place] and myself

## 2021-11-11 ENCOUNTER — Ambulatory Visit: Payer: Medicare Other | Admitting: Radiation Oncology

## 2021-11-11 ENCOUNTER — Ambulatory Visit: Payer: Medicare Other

## 2021-11-11 ENCOUNTER — Ambulatory Visit
Admission: RE | Admit: 2021-11-11 | Discharge: 2021-11-11 | Disposition: A | Discharge status: Home / Self Care | Payer: Medicare Other | Source: Ambulatory Visit | Attending: Radiation Oncology | Admitting: Radiation Oncology

## 2021-11-11 ENCOUNTER — Encounter: Payer: Self-pay | Admitting: Radiation Oncology

## 2021-11-11 DIAGNOSIS — Z171 Estrogen receptor negative status [ER-]: Secondary | ICD-10-CM | POA: Diagnosis not present

## 2021-11-11 DIAGNOSIS — C50412 Malignant neoplasm of upper-outer quadrant of left female breast: Secondary | ICD-10-CM | POA: Diagnosis not present

## 2021-11-11 DIAGNOSIS — Z510 Encounter for antineoplastic radiation therapy: Secondary | ICD-10-CM | POA: Diagnosis not present

## 2021-11-11 NOTE — Progress Notes (Signed)
[facility] request was faxed [phone]

## 2021-11-14 ENCOUNTER — Ambulatory Visit: Payer: Medicare Other

## 2021-11-15 ENCOUNTER — Ambulatory Visit: Payer: Medicare Other

## 2021-11-18 DIAGNOSIS — Z171 Estrogen receptor negative status [ER-]: Secondary | ICD-10-CM | POA: Diagnosis not present

## 2021-11-18 DIAGNOSIS — C50412 Malignant neoplasm of upper-outer quadrant of left female breast: Secondary | ICD-10-CM | POA: Diagnosis not present

## 2021-11-18 DIAGNOSIS — C773 Secondary and unspecified malignant neoplasm of axilla and upper limb lymph nodes: Secondary | ICD-10-CM | POA: Diagnosis not present

## 2021-11-24 ENCOUNTER — Inpatient Hospital Stay: Payer: Medicare Other

## 2021-11-30 ENCOUNTER — Telehealth: Payer: Self-pay | Admitting: *Deleted

## 2021-11-30 DIAGNOSIS — C773 Secondary and unspecified malignant neoplasm of axilla and upper limb lymph nodes: Secondary | ICD-10-CM | POA: Diagnosis not present

## 2021-11-30 DIAGNOSIS — Z171 Estrogen receptor negative status [ER-]: Secondary | ICD-10-CM | POA: Diagnosis not present

## 2021-11-30 DIAGNOSIS — C50412 Malignant neoplasm of upper-outer quadrant of left female breast: Secondary | ICD-10-CM | POA: Diagnosis not present

## 2021-11-30 NOTE — Telephone Encounter (Signed)
RETURNED PATIENT'S PHONE CALL, SPOKE WITH PATIENT. ?

## 2021-12-02 ENCOUNTER — Encounter (HOSPITAL_COMMUNITY): Payer: Self-pay

## 2021-12-02 ENCOUNTER — Emergency Department (HOSPITAL_BASED_OUTPATIENT_CLINIC_OR_DEPARTMENT_OTHER): Payer: Medicare Other

## 2021-12-02 ENCOUNTER — Emergency Department (HOSPITAL_COMMUNITY)
Admission: EM | Admit: 2021-12-02 | Discharge: 2021-12-02 | Disposition: A | Discharge status: Home / Self Care | Payer: Medicare Other | Attending: Emergency Medicine | Admitting: Emergency Medicine

## 2021-12-02 ENCOUNTER — Other Ambulatory Visit: Payer: Self-pay

## 2021-12-02 DIAGNOSIS — M79605 Pain in left leg: Secondary | ICD-10-CM | POA: Diagnosis not present

## 2021-12-02 DIAGNOSIS — M7989 Other specified soft tissue disorders: Secondary | ICD-10-CM

## 2021-12-02 DIAGNOSIS — R600 Localized edema: Secondary | ICD-10-CM | POA: Diagnosis not present

## 2021-12-02 DIAGNOSIS — R2242 Localized swelling, mass and lump, left lower limb: Secondary | ICD-10-CM | POA: Diagnosis not present

## 2021-12-02 DIAGNOSIS — I10 Essential (primary) hypertension: Secondary | ICD-10-CM | POA: Insufficient documentation

## 2021-12-02 DIAGNOSIS — Z7982 Long term (current) use of aspirin: Secondary | ICD-10-CM | POA: Insufficient documentation

## 2021-12-02 DIAGNOSIS — M79662 Pain in left lower leg: Secondary | ICD-10-CM | POA: Diagnosis present

## 2021-12-02 DIAGNOSIS — Z79899 Other long term (current) drug therapy: Secondary | ICD-10-CM | POA: Insufficient documentation

## 2021-12-02 DIAGNOSIS — Z853 Personal history of malignant neoplasm of breast: Secondary | ICD-10-CM | POA: Insufficient documentation

## 2021-12-02 NOTE — Progress Notes (Signed)
Lower extremity venous LT study completed.  Preliminary results relayed to [place], [place].  See CV Proc for preliminary results report.   Darlin Coco, RDMS, RVT

## 2021-12-02 NOTE — Discharge Instructions (Signed)
Call the orthopedic group in 4-5 days.  Return immediately back to the ER if:  Your symptoms worsen within the next 12-24 hours. You develop new symptoms such as new fevers, persistent vomiting, new pain, shortness of breath, or new weakness or numbness, or if you have any other concerns.

## 2021-12-02 NOTE — ED Triage Notes (Signed)
Patient reports that she has had left leg pain and swelling x 2 days. Patient went to an UC and patient was sent to r/o a DVT.  Patient denies SOB.

## 2021-12-02 NOTE — ED Provider Notes (Signed)
Emergency Medicine Provider Triage Evaluation Note  Tina Ellis , a 80 y.o. female  was evaluated in triage.  Pt complains of left leg pain and swelling the last 2 days.  Was seen and evaluated in urgent care, sent to the emergency department for DVT rule out.  History of breast cancer and radiation.  Last treatment was in August.  No chest pain, shortness of breath, abdominal pain, nausea, vomiting, diarrhea.  Review of Systems  Positive:  Negative: See above   Physical Exam  BP (!) 151/88 (BP Location: Right Arm)   Pulse 74   Temp 97.7 F (36.5 C) (Oral)   Resp 14   Ht 5\' 6"  (1.676 m)   Wt 65.8 kg   SpO2 100%   BMI 23.40 kg/m  Gen:   Awake, no distress   Resp:  Normal effort  MSK:   Moves extremities without difficulty  Other:  Numerous varicosities in the left lower extremity.  Mild swelling that is nonpitting.  No obvious erythema.  Medical Decision Making  Medically screening exam initiated at 4:41 PM.  Appropriate orders placed.  SUKHMAN KOCHER was informed that the remainder of the evaluation will be completed by another provider, this initial triage assessment does not replace that evaluation, and the importance of remaining in the ED until their evaluation is complete.     Myna Bright Golva, PA-C 12/02/21 1644    Dorie Rank, MD 12/02/21 2111

## 2021-12-02 NOTE — ED Provider Notes (Signed)
[facility] DEPT Provider Note   CSN: [id number] Arrival date & time: 12/02/21  1629     History Chief Complaint  Patient presents with   Leg Pain   Leg Swelling    Tina Ellis is a 80 y.o. female.  Patient presents with concern for swelling and pain along the left calf.  Symptoms ongoing for 2 days.  Denies fall or trauma.  Denies fevers or cough or vomiting or diarrhea.  She has a history of breast cancer last treatment in August, sent by her urgent care physicians to rule out DVT.      Past Medical History:  Diagnosis Date   Family history of breast cancer    Family history of colon cancer    Family history of lung cancer    GERD (gastroesophageal reflux disease)    Hypercholesteremia    Hypertension    left breast ca 06/2021   Seasonal allergies    TIA (transient ischemic attack)     Patient Active Problem List   Diagnosis Date Noted   Genetic testing 09/06/2021   Port-A-Cath in place 07/21/2021   Family history of colon cancer 07/18/2021   Family history of breast cancer 07/18/2021   Family history of lung cancer 07/18/2021   Malignant neoplasm of upper-outer quadrant of left breast in female, estrogen receptor negative ([place]) 07/12/2021   Elevated troponin 04/09/2017   TIA (transient ischemic attack) 04/08/2017   Abdominal pain    Stroke-like symptom 04/06/2017   HTN (hypertension) 04/06/2017   GERD (gastroesophageal reflux disease) 04/06/2017   Influenza A 12/15/2013   Hypotension 12/14/2013   Acute renal failure ([place]) 12/14/2013   UTI (lower urinary tract infection) 12/13/2013   Hypotension, unspecified 12/13/2013   Dehydration 12/13/2013    Past Surgical History:  Procedure Laterality Date   BACK SURGERY     x 2   CHOLECYSTECTOMY     PARTIAL HYSTERECTOMY       OB History   No obstetric history on file.     Family History  Problem Relation Age of Onset   Colon cancer Mother    Lung cancer Father     Crohn's disease Brother     Social History   Tobacco Use   Smoking status: Never   Smokeless tobacco: Never  Vaping Use   Vaping Use: Never used  Substance Use Topics   Alcohol use: No   Drug use: No    Home Medications Prior to Admission medications   Medication Sig Start Date End Date Taking? Authorizing Provider  amLODipine (NORVASC) 2.5 MG tablet Take 2.5 mg by mouth daily. 06/30/21   [provider]  aspirin EC 81 MG EC tablet Take 1 tablet (81 mg total) by mouth daily. 07/12/21   Nicholas Lose, MD  carvedilol (COREG) 12.5 MG tablet Take 1 tablet (12.5 mg total) by mouth 2 (two) times daily with a meal. Patient taking differently: Take 3.125 mg by mouth 2 (two) times daily with a meal. 04/10/17   Rosita Fire, MD  cetirizine (ZYRTEC) 10 MG tablet Take 10 mg by mouth at bedtime.    [provider]  cholecalciferol (VITAMIN D) 1000 UNITS tablet Take 2,000 Units by mouth every morning.    [provider]  hydrochlorothiazide (HYDRODIURIL) 12.5 MG tablet Take 12.5 mg by mouth daily. 06/30/21   [provider]  losartan (COZAAR) 100 MG tablet Take 100 mg by mouth every morning.    [provider]  losartan (  COZAAR) 100 MG tablet Take 1 tablet by mouth daily.    [provider]  omeprazole (PRILOSEC) 20 MG capsule Take 20 mg by mouth every morning.     [provider]  rosuvastatin (CRESTOR) 10 MG tablet Take 1 tablet (10 mg total) by mouth every evening. 04/10/17   Rosita Fire, MD  prochlorperazine (COMPAZINE) 10 MG tablet Take 1 tablet (10 mg total) by mouth every 6 (six) hours as needed (Nausea or vomiting). 07/15/21 07/26/21  Nicholas Lose, MD    Allergies    Conjugated estrogens, Fish oil, Nitrofurantoin, Benadryl itch stopping [diphenhydramine hcl], Other, and Pneumococcal 13-val conj vacc  Review of Systems   Review of Systems  Constitutional:  Negative for fever.  HENT:  Negative for ear pain.    Eyes:  Negative for pain.  Respiratory:  Negative for cough.   Cardiovascular:  Negative for chest pain.  Gastrointestinal:  Negative for abdominal pain.  Genitourinary:  Negative for flank pain.  Musculoskeletal:  Negative for back pain.  Skin:  Negative for rash.  Neurological:  Negative for headaches.   Physical Exam Updated Vital Signs BP 132/81 (BP Location: Right Arm)   Pulse 70   Temp 98 F (36.7 C) (Oral)   Resp 16   Ht 5\' 6"  (1.676 m)   Wt 65.8 kg   SpO2 100%   BMI 23.40 kg/m   Physical Exam Constitutional:      General: She is not in acute distress.    Appearance: Normal appearance.  HENT:     Head: Normocephalic.     Nose: Nose normal.  Eyes:     Extraocular Movements: Extraocular movements intact.  Cardiovascular:     Rate and Rhythm: Normal rate.  Pulmonary:     Effort: Pulmonary effort is normal.  Musculoskeletal:        General: Normal range of motion.     Cervical back: Normal range of motion.     Comments: Neurovascularly intact bilateral lower extremities.  Mild to moderate tenderness in the posterior calf left lower extremity.  Mild swelling noted.  No cellulitis noted.  Neurological:     General: No focal deficit present.     Mental Status: She is alert. Mental status is at baseline.    ED Results / Procedures / Treatments   Labs (all labs ordered are listed, but only abnormal results are displayed) Labs Reviewed - No data to display  EKG None  Radiology VAS [place] LOWER EXTREMITY VENOUS (DVT)  Result Date: 12/02/2021  Lower Venous DVT Study Patient Name:  Tina Ellis  Date of Exam:   12/02/2021 Medical Rec #: [id number]        Accession #:    [id number] Date of Birth: May 03, 1941        Patient Gender: F Patient Age:   36 years Exam Location:  [facility] Procedure:      VAS [place] LOWER EXTREMITY VENOUS (DVT) Referring Phys: Myna Bright --------------------------------------------------------------------------------  Indications: Left  leg pain, history of cancer.  Comparison Study: 08-04-2021 Performing Technologist: Darlin Coco RDMS, RVT  Examination Guidelines: A complete evaluation includes B-mode imaging, spectral Doppler, color Doppler, and power Doppler as needed of all accessible portions of each vessel. Bilateral testing is considered an integral part of a complete examination. Limited examinations for reoccurring indications may be performed as noted. The reflux portion of the exam is performed with the patient in reverse Trendelenburg.  +-----+---------------+---------+-----------+----------+--------------+ RIGHTCompressibilityPhasicitySpontaneityPropertiesThrombus Aging +-----+---------------+---------+-----------+----------+--------------+ CFV  Full  Yes      Yes                                 +-----+---------------+---------+-----------+----------+--------------+   +---------+---------------+---------+-----------+----------+--------------+ LEFT     CompressibilityPhasicitySpontaneityPropertiesThrombus Aging +---------+---------------+---------+-----------+----------+--------------+ CFV      Full           Yes      Yes                                 +---------+---------------+---------+-----------+----------+--------------+ SFJ      Full                                                        +---------+---------------+---------+-----------+----------+--------------+ FV Prox  Full                                                        +---------+---------------+---------+-----------+----------+--------------+ FV Mid   Full                                                        +---------+---------------+---------+-----------+----------+--------------+ FV DistalFull                                                        +---------+---------------+---------+-----------+----------+--------------+ PFV      Full                                                         +---------+---------------+---------+-----------+----------+--------------+ POP      Full           Yes      Yes                                 +---------+---------------+---------+-----------+----------+--------------+ PTV      Full                                                        +---------+---------------+---------+-----------+----------+--------------+ PERO     Full                                                        +---------+---------------+---------+-----------+----------+--------------+ Gastroc  Full                                                        +---------+---------------+---------+-----------+----------+--------------+  VV       Full                                                        +---------+---------------+---------+-----------+----------+--------------+    Summary: RIGHT: - No evidence of common femoral vein obstruction.  LEFT: - There is no evidence of deep vein thrombosis in the lower extremity.  - A cystic structure is found in the popliteal fossa measuring 4.25 cm.  *See table(s) above for measurements and observations.    Preliminary     Procedures Procedures   Medications Ordered in ED Medications - No data to display  ED Course  I have reviewed the triage vital signs and the nursing notes.  Pertinent labs & imaging results that were available during my care of the patient were reviewed by me and considered in my medical decision making (see chart for details).    MDM Rules/Calculators/A&P                           No evidence of DVT on ultrasound.  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  Recommending outpatient follow-up with orthopedic surgery within the week.  Recommending immediate return for difficulty breathing chest pain fevers or any additional concerns.  Final Clinical Impression(s) / ED Diagnoses Final diagnoses:  Leg swelling    Rx / DC Orders ED Discharge Orders     None         Luna Fuse, MD 12/02/21 2040

## 2021-12-12 ENCOUNTER — Telehealth: Payer: Self-pay | Admitting: *Deleted

## 2021-12-12 NOTE — Telephone Encounter (Signed)
RETURNED PATIENT'S PHONE CALL, SPOKE WITH PATIENT. ?

## 2021-12-13 NOTE — Progress Notes (Signed)
Tina Ellis is here today for follow up post radiation to the breast.   Breast Side:Left   They completed their radiation on: 11/11/21  Does the patient complain of any of the following: Post radiation skin issues: itching at treatment field Breast Tenderness: mild pain from mastectomy area Breast Swelling: not applicable Lymphadema: denies Range of Motion limitations: no Fatigue post radiation: mild Appetite good/fair/poor: fair to good  Additional comments if applicable: painful new cyst behind left knee, vaginal itching, dry cough x 3 weeks  Vitals:   12/14/21 1457  BP: 118/67  Pulse: 74  Resp: 18  Temp: (!) 96.1 F (35.6 C)  TempSrc: Temporal  SpO2: 96%  Weight: 147 lb 8 oz (66.9 kg)  Height: 5\' 6"  (1.676 m)

## 2021-12-14 ENCOUNTER — Telehealth: Payer: Self-pay

## 2021-12-14 ENCOUNTER — Other Ambulatory Visit: Payer: Self-pay

## 2021-12-14 ENCOUNTER — Inpatient Hospital Stay: Payer: Medicare Other | Attending: Hematology and Oncology

## 2021-12-14 ENCOUNTER — Ambulatory Visit
Admission: RE | Admit: 2021-12-14 | Discharge: 2021-12-14 | Disposition: A | Discharge status: Home / Self Care | Payer: Medicare Other | Source: Ambulatory Visit | Attending: Radiation Oncology | Admitting: Radiation Oncology

## 2021-12-14 ENCOUNTER — Encounter: Payer: Self-pay | Admitting: Radiation Oncology

## 2021-12-14 VITALS — BP 118/67 | HR 74 | Temp 96.1°F | Resp 18 | Ht 66.0 in | Wt 147.5 lb

## 2021-12-14 DIAGNOSIS — C50412 Malignant neoplasm of upper-outer quadrant of left female breast: Secondary | ICD-10-CM | POA: Insufficient documentation

## 2021-12-14 DIAGNOSIS — Z79899 Other long term (current) drug therapy: Secondary | ICD-10-CM | POA: Diagnosis not present

## 2021-12-14 DIAGNOSIS — Z171 Estrogen receptor negative status [ER-]: Secondary | ICD-10-CM | POA: Insufficient documentation

## 2021-12-14 DIAGNOSIS — Z95828 Presence of other vascular implants and grafts: Secondary | ICD-10-CM

## 2021-12-14 MED ORDER — HEPARIN SOD (PORK) LOCK FLUSH 100 UNIT/ML IV SOLN
500.0000 [IU] | Freq: Once | INTRAVENOUS | Status: AC
  Administered 2021-12-14: 14:00:00 500 [IU]

## 2021-12-14 MED ORDER — SODIUM CHLORIDE 0.9% FLUSH
10.0000 mL | Freq: Once | INTRAVENOUS | Status: AC
  Administered 2021-12-14: 14:00:00 10 mL

## 2021-12-14 NOTE — Progress Notes (Signed)
Radiation Oncology         [phone] ________________________________  Name: Tina Ellis MRN: [id number]  Date: 12/14/2021  DOB: Jul 25, 1941  Follow-Up Visit Note  Outpatient  CC: [name], MD  [name], MD  Diagnosis and Prior Radiotherapy:    ICD-10-CM   1. Malignant neoplasm of upper-outer quadrant of left breast in female, estrogen receptor negative ([place])  C50.412    Z17.1       CHIEF COMPLAINT: Here for follow-up and surveillance of Left Breast cancer  Narrative:  The patient returns today for routine follow-up.  Doing well with some persistent dry skin over chest wall, much improved overall                             ALLERGIES:  is allergic to conjugated estrogens, fish oil, nitrofurantoin, benadryl itch stopping [diphenhydramine hcl], other, and pneumococcal 13-val conj vacc.  Meds: Current Outpatient Medications  Medication Sig Dispense Refill   amLODipine (NORVASC) 2.5 MG tablet Take 2.5 mg by mouth daily.     aspirin EC 81 MG EC tablet Take 1 tablet (81 mg total) by mouth daily.     carvedilol (COREG) 12.5 MG tablet Take 1 tablet (12.5 mg total) by mouth 2 (two) times daily with a meal. (Patient taking differently: Take 3.125 mg by mouth 2 (two) times daily with a meal.) 60 tablet 0   cetirizine (ZYRTEC) 10 MG tablet Take 10 mg by mouth at bedtime.     cholecalciferol (VITAMIN D) 1000 UNITS tablet Take 2,000 Units by mouth every morning.     hydrochlorothiazide (HYDRODIURIL) 12.5 MG tablet Take 12.5 mg by mouth daily.     losartan (COZAAR) 100 MG tablet Take 1 tablet by mouth daily.     omeprazole (PRILOSEC) 20 MG capsule Take 20 mg by mouth every morning.      rosuvastatin (CRESTOR) 10 MG tablet Take 1 tablet (10 mg total) by mouth every evening. 30 tablet 0   No current facility-administered medications for this encounter.    Physical Findings: The patient is in no acute distress. Patient is alert and oriented.  height is 5\' 6"  (1.676 m) and  weight is 147 lb 8 oz (66.9 kg). Her temporal temperature is 96.1 F (35.6 C) (abnormal). Her blood pressure is 118/67 and her pulse is 74. Her respiration is 18 and oxygen saturation is 96%. .    Satisfactory skin healing in radiotherapy fields. Persistent patches of dry skin over left chest wall. No oozing or moist peeling. Right PAC in place.   Lab Findings: Lab Results  Component Value Date   WBC 5.4 07/26/2021   HGB 13.4 07/26/2021   HCT 38.6 07/26/2021   MCV 87.1 07/26/2021   PLT 234 07/26/2021    Radiographic Findings: VAS [place] LOWER EXTREMITY VENOUS (DVT)  Result Date: 12/04/2021  Lower Venous DVT Study Patient Name:  Tina Ellis  Date of Exam:   12/02/2021 Medical Rec #: [id number]        Accession #:    [id number] Date of Birth: September 11, 1941        Patient Gender: F Patient Age:   58 years Exam Location:  [facility] Procedure:      VAS [place] LOWER EXTREMITY VENOUS (DVT) Referring Phys: Myna Bright --------------------------------------------------------------------------------  Indications: Left leg pain, history of cancer.  Comparison Study: 08-04-2021 Performing Technologist: Darlin Coco RDMS, RVT  Examination Guidelines: A complete evaluation includes B-mode  imaging, spectral Doppler, color Doppler, and power Doppler as needed of all accessible portions of each vessel. Bilateral testing is considered an integral part of a complete examination. Limited examinations for reoccurring indications may be performed as noted. The reflux portion of the exam is performed with the patient in reverse Trendelenburg.  +-----+---------------+---------+-----------+----------+--------------+  RIGHT Compressibility Phasicity Spontaneity Properties Thrombus Aging  +-----+---------------+---------+-----------+----------+--------------+  CFV   Full            Yes       Yes                                    +-----+---------------+---------+-----------+----------+--------------+    +---------+---------------+---------+-----------+----------+--------------+  LEFT      Compressibility Phasicity Spontaneity Properties Thrombus Aging  +---------+---------------+---------+-----------+----------+--------------+  CFV       Full            Yes       Yes                                    +---------+---------------+---------+-----------+----------+--------------+  SFJ       Full                                                             +---------+---------------+---------+-----------+----------+--------------+  FV Prox   Full                                                             +---------+---------------+---------+-----------+----------+--------------+  FV Mid    Full                                                             +---------+---------------+---------+-----------+----------+--------------+  FV Distal Full                                                             +---------+---------------+---------+-----------+----------+--------------+  PFV       Full                                                             +---------+---------------+---------+-----------+----------+--------------+  POP       Full            Yes       Yes                                    +---------+---------------+---------+-----------+----------+--------------+  PTV       Full                                                             +---------+---------------+---------+-----------+----------+--------------+  PERO      Full                                                             +---------+---------------+---------+-----------+----------+--------------+  Gastroc   Full                                                             +---------+---------------+---------+-----------+----------+--------------+  VV        Full                                                             +---------+---------------+---------+-----------+----------+--------------+    Summary: RIGHT: - No evidence of common  femoral vein obstruction.  LEFT: - There is no evidence of deep vein thrombosis in the lower extremity.  - A cystic structure is found in the popliteal fossa measuring 4.25 cm.  *See table(s) above for measurements and observations. Electronically signed by Harold Barban MD on 12/04/2021 at 7:39:41 PM.    Final     Impression/Plan: Healing well from radiotherapy to the breast tissue.  Continue skin care with topical Vitamin E Oil and / or lotion for at least 2 more months for further healing.  I encouraged her to continue with yearly mammography as appropriate (for intact breast tissue) and followup with medical oncology. I will see her back on an as-needed basis. I have encouraged her to call if she has any issues or concerns in the future. I wished her the very best.  As she is not on Dr. Geralyn Flash schedule right now, I sent him and Thedore Mins a note - pt advised to call med/onc if she doesn't receive a call by January.  On date of service, in total, I spent 15 minutes on this encounter. Patient was seen in person.  _____________________________________   Eppie Gibson, MD

## 2021-12-14 NOTE — Telephone Encounter (Signed)
Spoke w/ patient to see if she  would like to do her 1 month in-person follow-up w/ Dr. Isidore Moos over the phone -per Egbert Garibaldi... Patient would like to do her appointment in person due to other appointment's in the building same day. I told patient that would be fine.

## 2021-12-15 ENCOUNTER — Inpatient Hospital Stay: Payer: Medicare Other

## 2021-12-15 ENCOUNTER — Telehealth: Payer: Self-pay | Admitting: Adult Health

## 2021-12-15 NOTE — Telephone Encounter (Signed)
Scheduled per sch msg. Called and spoke with patient. She does not wish to schedule right now. She will call back at the beginning of the year to schedule. Appt has been cancelled

## 2021-12-16 ENCOUNTER — Encounter: Payer: Self-pay | Admitting: Hematology and Oncology

## 2021-12-16 NOTE — Progress Notes (Signed)
° °                                                                                                                                                          °  Patient Name: Tina Ellis MRN: [id number] DOB: 1941/03/14 Referring Physician: [name] (Profile Not Attached) Date of Service: 11/11/2021 [facility] [place]                                                        End Of Treatment Note  Diagnoses: C50.412-Malignant neoplasm of upper-outer quadrant of left female breast  Cancer Staging:  Cancer Staging  Malignant neoplasm of upper-outer quadrant of left breast in female, estrogen receptor negative ([place]) Staging form: Breast, AJCC 8th Edition - Clinical: Stage IIIC (cT2, cN2, cM0, G3, ER-, PR-, HER2-) - Signed by Nicholas Lose, MD on 07/12/2021 Histologic grading system: 3 grade system   Stage IIIC (cT2, cN2, 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   Plan: The patient will follow-up with radiation oncology in 72mo.  -----------------------------------  Eppie Gibson, MD

## 2022-01-05 ENCOUNTER — Telehealth: Payer: Self-pay | Admitting: *Deleted

## 2022-01-05 ENCOUNTER — Encounter: Payer: Medicare Other | Admitting: Adult Health

## 2022-01-09 ENCOUNTER — Telehealth: Payer: Self-pay | Admitting: *Deleted

## 2022-01-09 DIAGNOSIS — N949 Unspecified condition associated with female genital organs and menstrual cycle: Secondary | ICD-10-CM | POA: Diagnosis not present

## 2022-01-09 DIAGNOSIS — C773 Secondary and unspecified malignant neoplasm of axilla and upper limb lymph nodes: Secondary | ICD-10-CM | POA: Diagnosis not present

## 2022-01-09 DIAGNOSIS — R300 Dysuria: Secondary | ICD-10-CM | POA: Diagnosis not present

## 2022-01-09 DIAGNOSIS — Z9012 Acquired absence of left breast and nipple: Secondary | ICD-10-CM | POA: Diagnosis not present

## 2022-01-09 DIAGNOSIS — C50912 Malignant neoplasm of unspecified site of left female breast: Secondary | ICD-10-CM | POA: Diagnosis not present

## 2022-01-09 DIAGNOSIS — Z95828 Presence of other vascular implants and grafts: Secondary | ICD-10-CM | POA: Diagnosis not present

## 2022-01-09 DIAGNOSIS — Z1231 Encounter for screening mammogram for malignant neoplasm of breast: Secondary | ICD-10-CM | POA: Diagnosis not present

## 2022-01-10 ENCOUNTER — Telehealth: Payer: Self-pay

## 2022-01-10 ENCOUNTER — Other Ambulatory Visit: Payer: Self-pay

## 2022-01-10 ENCOUNTER — Encounter: Payer: Medicare Other | Admitting: Adult Health

## 2022-01-10 ENCOUNTER — Other Ambulatory Visit: Payer: Medicare Other

## 2022-01-10 DIAGNOSIS — C50412 Malignant neoplasm of upper-outer quadrant of left female breast: Secondary | ICD-10-CM

## 2022-01-10 DIAGNOSIS — Z95828 Presence of other vascular implants and grafts: Secondary | ICD-10-CM

## 2022-01-10 NOTE — Telephone Encounter (Signed)
Called pt regarding appointment, pt says she is not short of breath but she has had a cough that her PCP is addressing for her.  She does not feel the need to come in for appointment today to be evaluated since she is having cough addressed by PCP.

## 2022-01-13 ENCOUNTER — Other Ambulatory Visit: Payer: Self-pay | Admitting: *Deleted

## 2022-01-13 ENCOUNTER — Ambulatory Visit (HOSPITAL_COMMUNITY)
Admission: RE | Admit: 2022-01-13 | Discharge: 2022-01-13 | Disposition: A | Discharge status: Home / Self Care | Payer: Medicare Other | Source: Ambulatory Visit | Attending: Hematology and Oncology | Admitting: Hematology and Oncology

## 2022-01-13 ENCOUNTER — Other Ambulatory Visit: Payer: Self-pay

## 2022-01-13 DIAGNOSIS — Z171 Estrogen receptor negative status [ER-]: Secondary | ICD-10-CM | POA: Insufficient documentation

## 2022-01-13 DIAGNOSIS — R059 Cough, unspecified: Secondary | ICD-10-CM | POA: Diagnosis not present

## 2022-01-13 DIAGNOSIS — C50412 Malignant neoplasm of upper-outer quadrant of left female breast: Secondary | ICD-10-CM

## 2022-01-13 NOTE — Progress Notes (Signed)
Received call from pt with complaint of dry cough since stopping radiation therapy.  RN reviewed with MD, verbal orders received for pt to undergo chest Xray for further evaluation.  Orders placed, pt notified that no appt is needed and to report to [facility] main entrance for check in to undergo scan.  Pt verbalized understanding.

## 2022-01-17 DIAGNOSIS — C50912 Malignant neoplasm of unspecified site of left female breast: Secondary | ICD-10-CM | POA: Diagnosis not present

## 2022-01-17 DIAGNOSIS — Z9012 Acquired absence of left breast and nipple: Secondary | ICD-10-CM | POA: Diagnosis not present

## 2022-01-17 DIAGNOSIS — C773 Secondary and unspecified malignant neoplasm of axilla and upper limb lymph nodes: Secondary | ICD-10-CM | POA: Diagnosis not present

## 2022-01-17 DIAGNOSIS — R200 Anesthesia of skin: Secondary | ICD-10-CM | POA: Diagnosis not present

## 2022-01-24 DIAGNOSIS — Z95828 Presence of other vascular implants and grafts: Secondary | ICD-10-CM | POA: Diagnosis not present

## 2022-01-24 DIAGNOSIS — C50912 Malignant neoplasm of unspecified site of left female breast: Secondary | ICD-10-CM | POA: Diagnosis not present

## 2022-01-24 DIAGNOSIS — E785 Hyperlipidemia, unspecified: Secondary | ICD-10-CM | POA: Diagnosis not present

## 2022-01-24 DIAGNOSIS — Z7982 Long term (current) use of aspirin: Secondary | ICD-10-CM | POA: Diagnosis not present

## 2022-01-24 DIAGNOSIS — I10 Essential (primary) hypertension: Secondary | ICD-10-CM | POA: Diagnosis not present

## 2022-01-24 DIAGNOSIS — Z8673 Personal history of transient ischemic attack (TIA), and cerebral infarction without residual deficits: Secondary | ICD-10-CM | POA: Diagnosis not present

## 2022-01-24 DIAGNOSIS — Z79899 Other long term (current) drug therapy: Secondary | ICD-10-CM | POA: Diagnosis not present

## 2022-01-24 DIAGNOSIS — Z452 Encounter for adjustment and management of vascular access device: Secondary | ICD-10-CM | POA: Diagnosis not present

## 2022-01-24 DIAGNOSIS — C773 Secondary and unspecified malignant neoplasm of axilla and upper limb lymph nodes: Secondary | ICD-10-CM | POA: Diagnosis not present

## 2022-01-24 DIAGNOSIS — Z888 Allergy status to other drugs, medicaments and biological substances status: Secondary | ICD-10-CM | POA: Diagnosis not present

## 2022-02-23 ENCOUNTER — Inpatient Hospital Stay: Payer: Medicare Other

## 2022-03-02 ENCOUNTER — Telehealth: Payer: Self-pay | Admitting: *Deleted

## 2022-03-06 ENCOUNTER — Inpatient Hospital Stay: Payer: Medicare Other | Admitting: Nurse Practitioner

## 2022-03-24 DIAGNOSIS — I10 Essential (primary) hypertension: Secondary | ICD-10-CM | POA: Diagnosis not present

## 2022-03-24 DIAGNOSIS — E7800 Pure hypercholesterolemia, unspecified: Secondary | ICD-10-CM | POA: Diagnosis not present

## 2022-03-24 DIAGNOSIS — K219 Gastro-esophageal reflux disease without esophagitis: Secondary | ICD-10-CM | POA: Diagnosis not present

## 2022-03-24 DIAGNOSIS — E559 Vitamin D deficiency, unspecified: Secondary | ICD-10-CM | POA: Diagnosis not present

## 2022-04-17 DIAGNOSIS — E559 Vitamin D deficiency, unspecified: Secondary | ICD-10-CM | POA: Diagnosis not present

## 2022-04-17 DIAGNOSIS — E876 Hypokalemia: Secondary | ICD-10-CM | POA: Diagnosis not present

## 2022-04-17 DIAGNOSIS — E7800 Pure hypercholesterolemia, unspecified: Secondary | ICD-10-CM | POA: Diagnosis not present

## 2022-04-17 DIAGNOSIS — I10 Essential (primary) hypertension: Secondary | ICD-10-CM | POA: Diagnosis not present

## 2022-04-17 DIAGNOSIS — R635 Abnormal weight gain: Secondary | ICD-10-CM | POA: Diagnosis not present

## 2022-04-17 DIAGNOSIS — Z0000 Encounter for general adult medical examination without abnormal findings: Secondary | ICD-10-CM | POA: Diagnosis not present

## 2022-04-23 DIAGNOSIS — K219 Gastro-esophageal reflux disease without esophagitis: Secondary | ICD-10-CM | POA: Diagnosis not present

## 2022-04-23 DIAGNOSIS — E7800 Pure hypercholesterolemia, unspecified: Secondary | ICD-10-CM | POA: Diagnosis not present

## 2022-04-23 DIAGNOSIS — E559 Vitamin D deficiency, unspecified: Secondary | ICD-10-CM | POA: Diagnosis not present

## 2022-04-23 DIAGNOSIS — I10 Essential (primary) hypertension: Secondary | ICD-10-CM | POA: Diagnosis not present

## 2022-05-10 DIAGNOSIS — C50912 Malignant neoplasm of unspecified site of left female breast: Secondary | ICD-10-CM | POA: Diagnosis not present

## 2022-05-10 DIAGNOSIS — R0981 Nasal congestion: Secondary | ICD-10-CM | POA: Diagnosis not present

## 2022-05-10 DIAGNOSIS — I10 Essential (primary) hypertension: Secondary | ICD-10-CM | POA: Diagnosis not present

## 2022-05-10 DIAGNOSIS — E559 Vitamin D deficiency, unspecified: Secondary | ICD-10-CM | POA: Diagnosis not present

## 2022-05-10 DIAGNOSIS — E7800 Pure hypercholesterolemia, unspecified: Secondary | ICD-10-CM | POA: Diagnosis not present

## 2022-05-10 DIAGNOSIS — Z0000 Encounter for general adult medical examination without abnormal findings: Secondary | ICD-10-CM | POA: Diagnosis not present

## 2022-05-10 DIAGNOSIS — I6529 Occlusion and stenosis of unspecified carotid artery: Secondary | ICD-10-CM | POA: Diagnosis not present

## 2022-07-05 DIAGNOSIS — C50912 Malignant neoplasm of unspecified site of left female breast: Secondary | ICD-10-CM | POA: Diagnosis not present

## 2022-07-05 DIAGNOSIS — Z9012 Acquired absence of left breast and nipple: Secondary | ICD-10-CM | POA: Diagnosis not present

## 2022-07-05 DIAGNOSIS — Z1231 Encounter for screening mammogram for malignant neoplasm of breast: Secondary | ICD-10-CM | POA: Diagnosis not present

## 2022-07-05 DIAGNOSIS — R457 State of emotional shock and stress, unspecified: Secondary | ICD-10-CM | POA: Diagnosis not present

## 2022-07-05 DIAGNOSIS — C773 Secondary and unspecified malignant neoplasm of axilla and upper limb lymph nodes: Secondary | ICD-10-CM | POA: Diagnosis not present

## 2022-07-11 DIAGNOSIS — I10 Essential (primary) hypertension: Secondary | ICD-10-CM | POA: Diagnosis not present

## 2022-07-24 IMAGING — CT CT CHEST W/ CM
2 of 4 series · 15 of 36 positions shown, 18 images · IV contrast (OMNIPAQUE)
Comparison: Comparison made with [REDACTED] of 3583 and examination

CLINICAL DATA: Breast cancer, staging prior to initiation of
therapy.

EXAM:
CT CHEST WITH CONTRAST
TECHNIQUE: Multidetector CT imaging of the chest was performed during
intravenous contrast administration.
CONTRAST:  60mL OMNIPAQUE IOHEXOL 350 MG/ML SOLN

[Series 2: axial st · axial · 0.76mm/px · z∈[-254,+38]mm · 12 of 174 slices shown, 15 images]
[im 14/174  mediastinal]
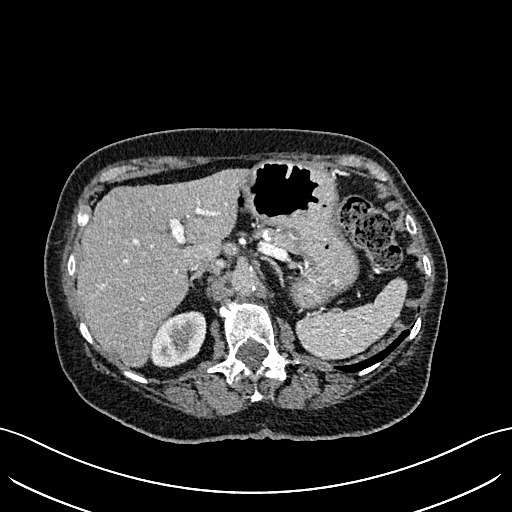
[im 14/174  lung]
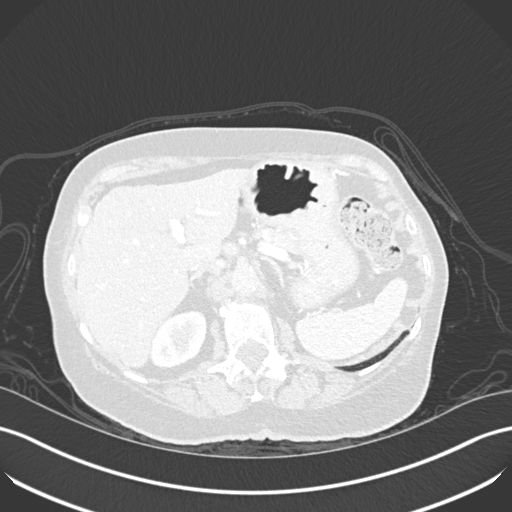
[im 27/174  lung]
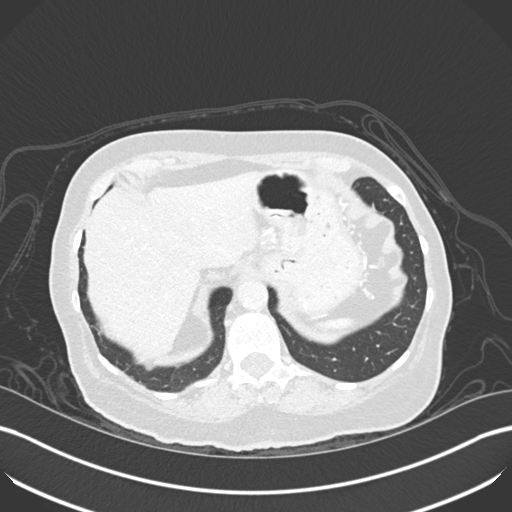
[im 40/174  lung]
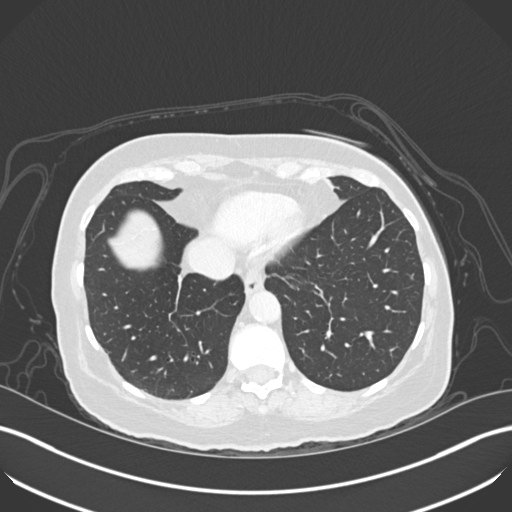
[im 54/174  lung]
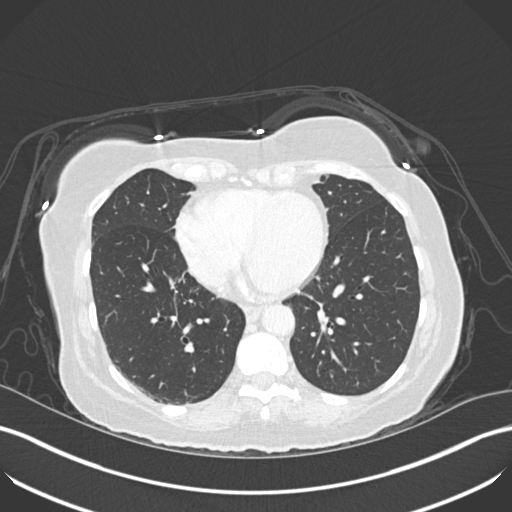
[im 67/174  mediastinal]
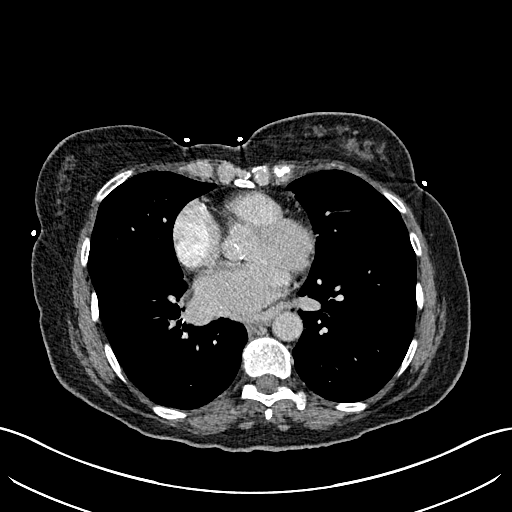
[im 67/174  lung]
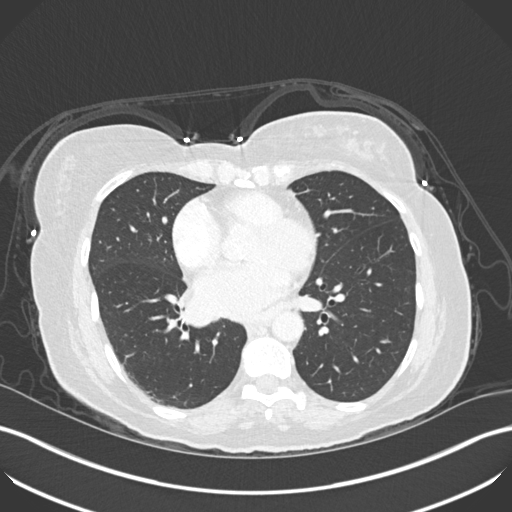
[im 80/174  lung]
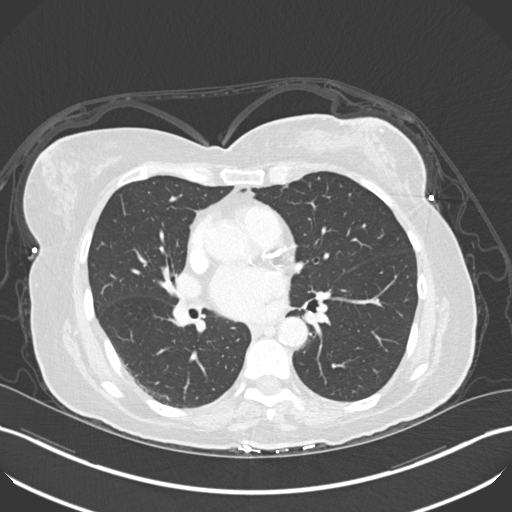
[im 94/174  lung]
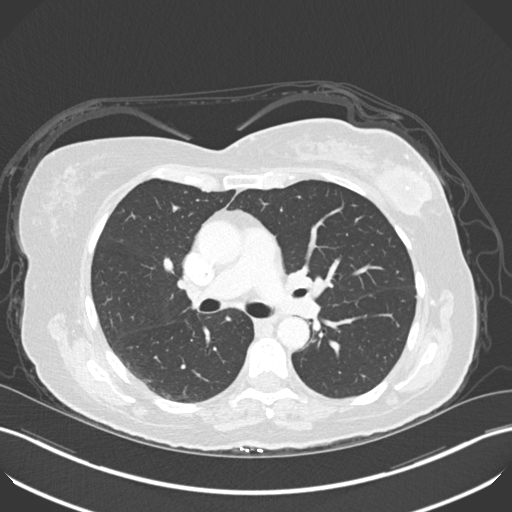
[im 107/174  lung]
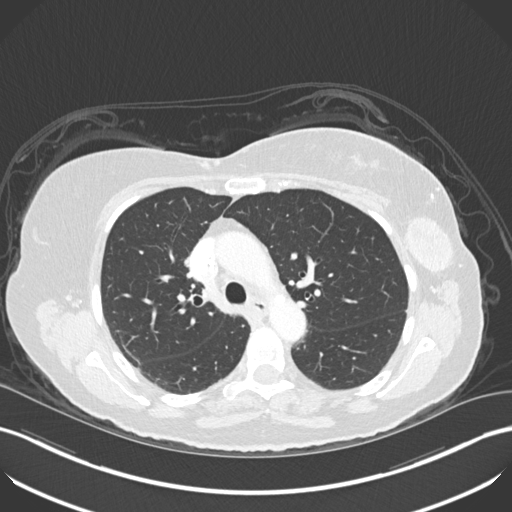
[im 120/174  mediastinal]
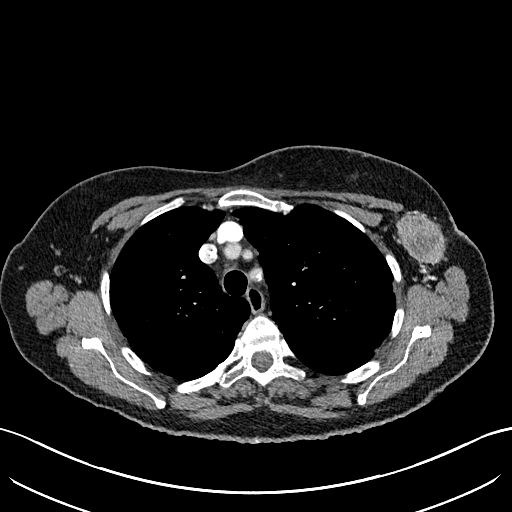
[im 120/174  lung]
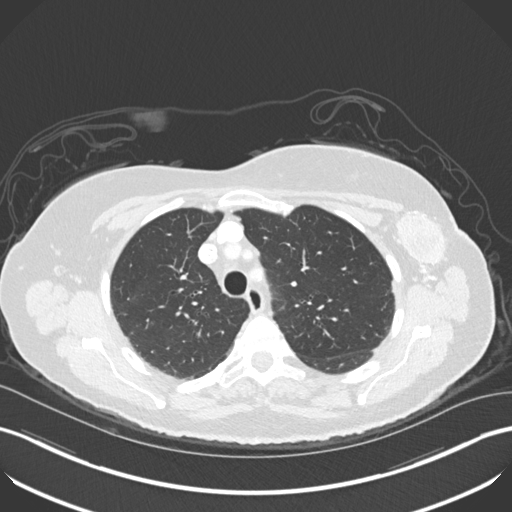
[im 134/174  lung]
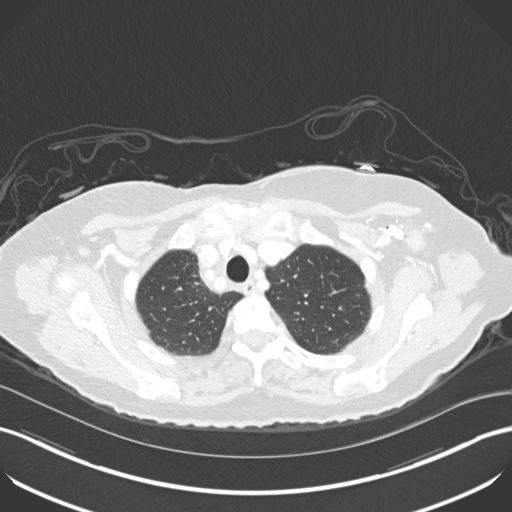
[im 147/174  lung]
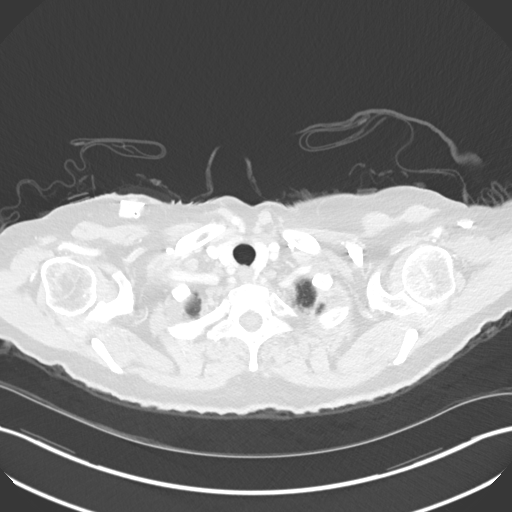
[im 160/174  lung]
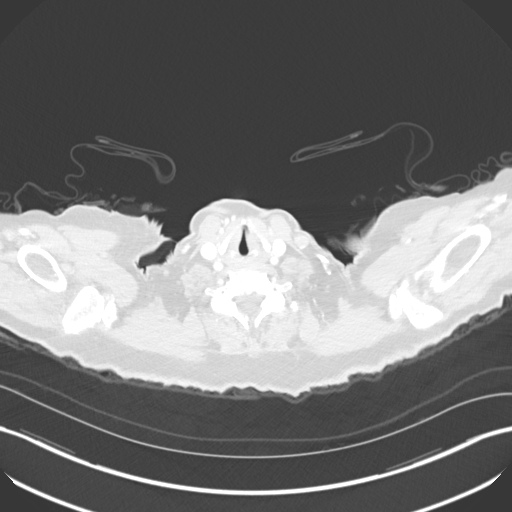

[Series 5: coronal · coronal · 0.69mm/px · 3 of 121 slices shown]
[im 25/121  lung]
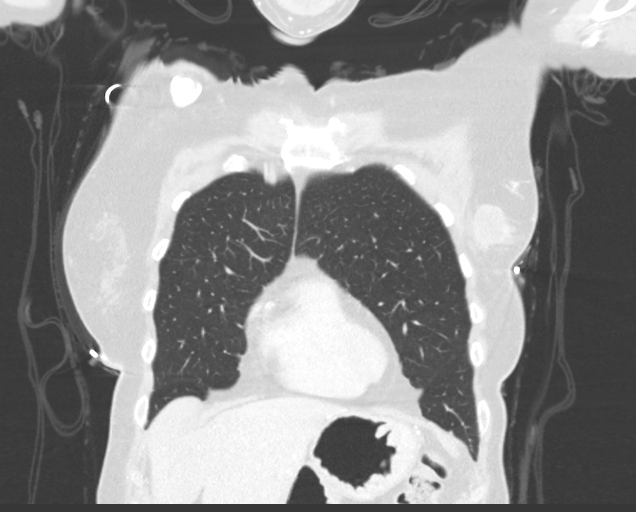
[im 49/121  lung]
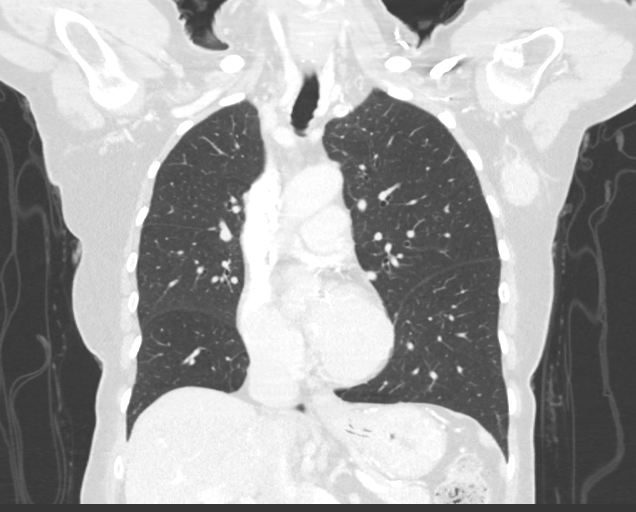
[im 73/121  lung]
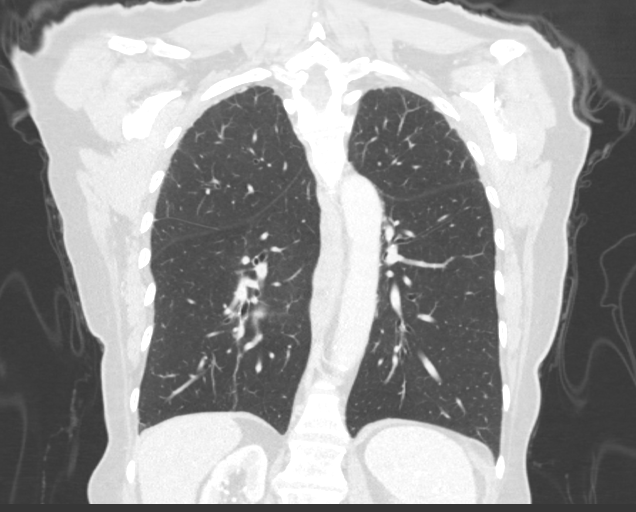

[15 of 36 positions shown; findings below may reference images not displayed]

FINDINGS: Cardiovascular: RIGHT-sided Port-A-Cath in situ terminates at the
caval to atrial junction.

Three-vessel coronary artery calcification. Heart size is normal
without pericardial effusion. Calcified atheromatous plaque and
noncalcified plaque in the thoracic aorta, moderate without
aneurysmal dilation. Central pulmonary vasculature is unremarkable.

Mediastinum/Nodes: Bulky LEFT axillary lymph node with marker from
recent biopsy in the central portion of this large, 4.9 x 3.1 cm
LEFT axillary lymph node (image 61/2)

High LEFT axillary lymph node along the margin of the pectoralis
musculature (image 48/2) 1 cm.

No thoracic inlet lymphadenopathy. No internal mammary
lymphadenopathy. No mediastinal lymphadenopathy. No hilar
lymphadenopathy.

Lungs/Pleura: Pulmonary nodules in the RIGHT upper lobe (image 53/7)
3 mm.

(Image 80/7) 3-4 mm. No additional pulmonary nodules. No
consolidation. No pleural effusion. Airways are patent.

Upper Abdomen: No acute findings in the upper abdomen. Imaged
portions of liver, spleen, pancreas, adrenal glands and kidneys are
unremarkable. No acute gastrointestinal findings to the extent
evaluated on limited assessment. No upper abdominal lymphadenopathy.

Musculoskeletal: No acute musculoskeletal process. Spinal
degenerative changes.

LEFT breast mass with heterogeneous enhancement, biopsy marker in
the central portion measuring 4.2 x 2.9 cm.
IMPRESSION: 1. LEFT breast mass with bulky LEFT axillary lymphadenopathy as
described.
2. Small RIGHT upper lobe pulmonary nodules, nonspecific. Attention
on follow-up, consider short interval follow-up.
3. Three-vessel coronary artery calcification.
4. Aortic atherosclerosis.

Aortic Atherosclerosis ([id number]).

## 2022-08-24 ENCOUNTER — Encounter: Payer: Self-pay | Admitting: Adult Health

## 2022-08-24 ENCOUNTER — Other Ambulatory Visit: Payer: Self-pay

## 2022-08-24 ENCOUNTER — Inpatient Hospital Stay: Payer: Medicare Other | Attending: Adult Health | Admitting: Adult Health

## 2022-08-24 VITALS — BP 140/75 | HR 74 | Temp 97.9°F | Resp 16 | Ht 66.0 in | Wt 141.2 lb

## 2022-08-24 DIAGNOSIS — C50412 Malignant neoplasm of upper-outer quadrant of left female breast: Secondary | ICD-10-CM | POA: Insufficient documentation

## 2022-08-24 DIAGNOSIS — C773 Secondary and unspecified malignant neoplasm of axilla and upper limb lymph nodes: Secondary | ICD-10-CM | POA: Insufficient documentation

## 2022-08-24 DIAGNOSIS — Z171 Estrogen receptor negative status [ER-]: Secondary | ICD-10-CM | POA: Diagnosis not present

## 2022-08-24 NOTE — Progress Notes (Signed)
SURVIVORSHIP VISIT:  BRIEF ONCOLOGIC HISTORY:  Oncology History  Malignant neoplasm of upper-outer quadrant of left breast in female, estrogen receptor negative ([place])  06/23/2021 Initial Diagnosis   Patient felt left axillary lump which led to mammogram and ultrasound that revealed 4.1 cm left breast mass with a 6.7 cm left axillary lymph node.  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  Lymphovascular invasion noted, ER 0%, PR 0%, HER2 negative   07/12/2021 Cancer Staging   Staging form: Breast, AJCC 8th Edition - Clinical: Stage IIIC (cT2, cN2, cM0, G3, ER-, PR-, HER2-) - Signed by Nicholas Lose, MD on 07/12/2021 Histologic grading system: 3 grade system   07/21/2021 - 07/21/2021 Chemotherapy   1 cycle of Taxol carboplatin and Keytruda.  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 The report date is 08/25/2021.  The Multi-Cancer Panel + RNA offered by [facility] includes sequencing and/or deletion duplication testing of the following 84 genes: AIP, ALK, APC, ATM, AXIN2,BAP1,  BARD1, BLM, BMPR1A, BRCA1, BRCA2, BRIP1, CASR, CDC73, CDH1, CDK4, CDKN1B, CDKN1C, CDKN2A (p14ARF), CDKN2A (p16INK4a), CEBPA, CHEK2, CTNNA1, DICER1, DIS3L2, EGFR (c.2369C>T, p.Thr790Met variant only), EPCAM (Deletion/duplication testing only), FH, FLCN, GATA2, GPC3, GREM1 (Promoter region deletion/duplication testing only), HOXB13 (c.251G>A, p.Gly84Glu), HRAS, KIT, MAX, MEN1, MET, MITF (c.952G>A, p.Glu318Lys variant only), MLH1, MSH2, MSH3, MSH6, MUTYH, NBN, NF1, NF2, NTHL1, PALB2, PDGFRA, PHOX2B, PMS2, POLD1, POLE, POT1, PRKAR1A, PTCH1, PTEN, RAD50, RAD51C, RAD51D, RB1,  RECQL4, RET, RUNX1, SDHAF2, SDHA (sequence changes only), SDHB, SDHC, SDHD, SMAD4, SMARCA4, SMARCB1, SMARCE1, STK11, SUFU, TERC, TERT, TMEM127, TP53, TSC1, TSC2, VHL, WRN and WT1.   10/10/2021 - 11/11/2021 Radiation Therapy   Site Technique Total Dose (Gy) Dose per Fx (Gy) Completed Fx Beam Energies  Chest Wall, Left: CW_Lt 3D 50/50 2 25/25 6X  Chest Wall, Left: CW_Lt_SCV_PAB 3D 50/50 2 25/25 6X, 10X       INTERVAL HISTORY:  [name] to review her survivorship care plan detailing her treatment course for breast cancer, as well as monitoring long-term side effects of that treatment, education regarding health maintenance, screening, and overall wellness and health promotion.     Overall, [name] reports feeling quite well.  She denies any significant issues today other than a mild intermittent chronic cough.    REVIEW OF SYSTEMS:  Review of Systems  Constitutional:  Negative for appetite change, chills, fatigue, fever and unexpected weight change.  HENT:   Negative for hearing loss, lump/mass and trouble swallowing.   Eyes:  Negative for eye problems and icterus.  Respiratory:  Positive for cough. Negative for chest tightness and shortness of breath.   Cardiovascular:  Negative for chest pain, leg swelling and palpitations.  Gastrointestinal:  Negative for abdominal distention, abdominal pain, constipation, diarrhea, nausea and vomiting.  Endocrine: Negative for hot flashes.  Genitourinary:  Negative for difficulty urinating.   Musculoskeletal:  Negative for arthralgias.  Skin:  Negative for itching and rash.  Neurological:  Negative for dizziness, extremity weakness, headaches and numbness.  Hematological:  Negative for adenopathy. Does not bruise/bleed easily.  Psychiatric/Behavioral:  Negative for depression. The patient is not nervous/anxious.   Breast: Denies any new nodularity, masses, tenderness, nipple changes, or nipple discharge.  ONCOLOGY TREATMENT TEAM:  1.  Surgeon:  Dr. Jackson [facility] at [facility] 2. Medical Oncologist: Dr. Lindi Adie  3. Radiation Oncologist: Dr. Isidore Moos    PAST MEDICAL/SURGICAL HISTORY:  Past Medical History:  Diagnosis Date   Family history of breast cancer    Family history of colon cancer    Family history of lung cancer    GERD (gastroesophageal reflux disease)    Hypercholesteremia    Hypertension    left breast ca 06/2021   Seasonal allergies    TIA (transient ischemic attack)    Past Surgical History:  Procedure Laterality Date   BACK SURGERY     x 2   CHOLECYSTECTOMY     PARTIAL HYSTERECTOMY       ALLERGIES:  Allergies  Allergen Reactions   Conjugated Estrogens Other (See Comments)   Fish Oil Rash   Nitrofurantoin Nausea And Vomiting   Benadryl Itch Stopping [Diphenhydramine Hcl] Other (See Comments)    Tingling in hands and feet. Trouble speaking    Other     Other reaction(s): nausea   Pneumococcal 13-Val Conj Vacc Other (See Comments)     CURRENT MEDICATIONS:  Outpatient Encounter Medications as of 08/24/2022  Medication Sig   amLODipine (NORVASC) 2.5 MG tablet Take 2.5 mg by mouth daily.   aspirin EC 81 MG EC tablet Take 1 tablet (81 mg total) by mouth daily.   carvedilol (COREG) 12.5 MG tablet Take 1 tablet (12.5 mg total) by mouth 2 (two) times daily with a meal. (Patient taking differently: Take 3.125 mg by mouth 2 (two) times daily with a meal.)   cetirizine (ZYRTEC) 10 MG tablet Take 10 mg by mouth at bedtime.   cholecalciferol (VITAMIN D) 1000 UNITS tablet Take 2,000 Units by mouth every morning.   hydrochlorothiazide (HYDRODIURIL) 12.5 MG tablet Take 12.5 mg by mouth daily.   losartan (COZAAR) 100 MG tablet Take 1 tablet by mouth daily.   omeprazole (PRILOSEC) 20 MG capsule Take 20 mg by mouth every morning.    rosuvastatin (CRESTOR) 10 MG tablet Take 1 tablet (10 mg total) by mouth every evening.   [DISCONTINUED] prochlorperazine (COMPAZINE) 10 MG tablet Take 1 tablet (10 mg  total) by mouth every 6 (six) hours as needed (Nausea or vomiting).   No facility-administered encounter medications on file as of 08/24/2022.     ONCOLOGIC FAMILY HISTORY:  Family History  Problem Relation Age of Onset   Colon cancer Mother    Lung cancer Father    Crohn's disease Brother      SOCIAL HISTORY:  Social History   Socioeconomic History   Marital status: Widowed    Spouse name: Not on file   Number of children: 2   Years of education: HS   Highest education level: Not on file  Occupational History   Occupation: Part-time Press photographer  Tobacco Use   Smoking status: Never   Smokeless tobacco: Never  Vaping Use   Vaping Use: Never used  Substance and Sexual Activity   Alcohol use: No   Drug use: No   Sexual activity: Not Currently  Other Topics Concern   Not on file  Social History Narrative   Lives at home with son and grandson.   Right-handed.   1-2 cups caffeine per day.   Social Determinants of Health   Financial Resource Strain: Not on file  Food Insecurity: Not on file  Transportation Needs: Not on file  Physical Activity: Not on file  Stress: Not on file  Social Connections: Not on file  Intimate Partner Violence: Not on file     OBSERVATIONS/OBJECTIVE:  BP (!) 140/75 (BP Location: Left Arm, Patient Position: Sitting)   Pulse 74   Temp 97.9 F (36.6 C) (Temporal)   Resp 16   Ht '5\' 6"'  (1.676 m)   Wt 141 lb 3.2 oz (64 kg)   SpO2 98%   BMI 22.79 kg/m  GENERAL: Patient is a well appearing female in no acute distress HEENT:  Sclerae anicteric.  Oropharynx clear and moist. No ulcerations or evidence of oropharyngeal candidiasis. Neck is supple.  NODES:  No cervical, supraclavicular, or axillary lymphadenopathy palpated.  BREAST EXAM:  s/p left mastectomy and radiation, no sign of local recurrence, right breast benign LUNGS:  Clear to auscultation bilaterally.  No wheezes or rhonchi. HEART:  Regular rate and rhythm. No murmur  appreciated. ABDOMEN:  Soft, nontender.  Positive, normoactive bowel sounds. No organomegaly palpated. MSK:  No focal spinal tenderness to palpation. Full range of motion bilaterally in the upper extremities. EXTREMITIES:  No peripheral edema.   SKIN:  Clear with no obvious rashes or skin changes. No nail dyscrasia. NEURO:  Nonfocal. Well oriented.  Appropriate affect.   LABORATORY DATA:  None for this visit.  DIAGNOSTIC IMAGING:  None for this visit.      ASSESSMENT AND PLAN:  Ms.. Tina Ellis is a pleasant 81 y.o. 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  She presents to the Survivorship Clinic for our initial meeting and routine follow-up post-completion of treatment for breast cancer.    1. Stage IIIC left breast cancer:  Ms. Dunkley is continuing to recover from definitive treatment for breast cancer. She will follow-up with her medical oncologist, Dr. Lindi Adie in 6 months with history and physical exam per surveillance protocol.  Recommended that she continue with annual right breast screening mammograms.. Today, a comprehensive survivorship care plan and treatment summary was reviewed with the patient today detailing her breast cancer diagnosis, treatment course, potential late/long-term effects of treatment, appropriate follow-up care with recommendations for the future, and patient education resources.  A copy of this summary, along with a letter will be sent to the patient's primary care provider via mail/fax/In Basket message after today's visit.    2.  Chronic cough: CT chest from 1 year ago demonstrates some very small pulmonary nodules that were indeterminate.  We will repeat CT chest to ensure stability and ensure no other etiology for this chronic cough.  3. Bone health:   She was given education on specific activities to promote bone health.  4.  Cancer screening:  Due to Ms. Meetze's history and her age, she should receive screening for skin cancers.  The information and recommendations are listed on the patient's comprehensive care plan/treatment summary and were reviewed in detail with the patient.    5. Health maintenance and wellness promotion: Ms. Osburn was encouraged to consume 5-7 servings of fruits and vegetables per day. We reviewed the "Nutrition Rainbow" handout.  She was also encouraged to engage in moderate to vigorous exercise for 30 minutes per day most days of the week. We discussed the [employer] fitness program, which is designed for cancer survivors to help them become more physically fit after cancer treatments.  She was instructed to limit her alcohol consumption and continue to abstain from tobacco use.     6. Support services/counseling: It is not uncommon for this  period of the patient's cancer care trajectory to be one of many emotions and stressors.  She was given information regarding our available services and encouraged to contact me with any questions or for help enrolling in any of our support group/programs.    Follow up instructions:    -Return to cancer center in 6 months for f/u with Dr. Lindi Adie --Will see me in 1 year for f/u as Dr. Jackson Latino is no longer practicing in [facility]    The patient was provided an opportunity to ask questions and all were answered. The patient agreed with the plan and demonstrated an understanding of the instructions.   Total encounter time:40 minutes*in face-to-face visit time, chart review, lab review, care coordination, order entry, and documentation of the encounter time.  Wilber Bihari, NP 08/24/22 2:34 PM Medical Oncology and Hematology [facility] [place], [place] [address] Tel. [phone]    Fax. [phone]  *Total Encounter Time as defined by the Centers for Medicare and Medicaid Services includes, in addition to the  face-to-face time of a patient visit (documented in the note above) non-face-to-face time: obtaining and reviewing outside history, ordering and reviewing medications, tests or procedures, care coordination (communications with other health care professionals or caregivers) and documentation in the medical record.

## 2022-08-29 ENCOUNTER — Telehealth: Payer: Self-pay | Admitting: Adult Health

## 2022-08-29 NOTE — Telephone Encounter (Signed)
Scheduled appointment per 8/31 los. Patient is aware.

## 2022-09-05 ENCOUNTER — Ambulatory Visit (HOSPITAL_COMMUNITY)
Admission: RE | Admit: 2022-09-05 | Discharge: 2022-09-05 | Disposition: A | Discharge status: Home / Self Care | Payer: Medicare Other | Source: Ambulatory Visit | Attending: Adult Health | Admitting: Adult Health

## 2022-09-05 DIAGNOSIS — R918 Other nonspecific abnormal finding of lung field: Secondary | ICD-10-CM | POA: Diagnosis not present

## 2022-09-05 DIAGNOSIS — C50412 Malignant neoplasm of upper-outer quadrant of left female breast: Secondary | ICD-10-CM | POA: Diagnosis not present

## 2022-09-05 DIAGNOSIS — Z171 Estrogen receptor negative status [ER-]: Secondary | ICD-10-CM | POA: Insufficient documentation

## 2022-09-05 DIAGNOSIS — J9811 Atelectasis: Secondary | ICD-10-CM | POA: Diagnosis not present

## 2022-09-05 LAB — POCT I-STAT CREATININE: Creatinine, Ser: 1 mg/dL (ref 0.44–1.00)

## 2022-09-05 MED ORDER — SODIUM CHLORIDE (PF) 0.9 % IJ SOLN
INTRAMUSCULAR | Status: AC
  Filled 2022-09-05: qty 50

## 2022-09-05 MED ORDER — IOHEXOL 300 MG/ML  SOLN
75.0000 mL | Freq: Once | INTRAMUSCULAR | Status: AC | PRN
  Administered 2022-09-05: 75 mL via INTRAVENOUS

## 2022-09-08 ENCOUNTER — Telehealth: Payer: Self-pay

## 2022-09-08 DIAGNOSIS — C50412 Malignant neoplasm of upper-outer quadrant of left female breast: Secondary | ICD-10-CM

## 2022-09-08 NOTE — Telephone Encounter (Signed)
Called pt to give results per NP. She verbalized thanks and understanding. She is aware she will need another CT CHEST in 6 mos. Orders entered per NP.

## 2022-09-08 NOTE — Telephone Encounter (Signed)
-----   Message from Gardenia Phlegm, NP sent at 09/08/2022 10:42 AM EDT ----- No cancer, please notify patient we need to repeat CT in 6 months to f/u on the non specific areas ----- Message ----- From: Interface, Rad Results In Sent: 09/06/2022   2:33 PM EDT To: Gardenia Phlegm, NP

## 2022-10-17 DIAGNOSIS — Z23 Encounter for immunization: Secondary | ICD-10-CM | POA: Diagnosis not present

## 2022-11-14 DIAGNOSIS — H2522 Age-related cataract, morgagnian type, left eye: Secondary | ICD-10-CM | POA: Diagnosis not present

## 2022-11-14 DIAGNOSIS — H2520 Age-related cataract, morgagnian type, unspecified eye: Secondary | ICD-10-CM | POA: Diagnosis not present

## 2022-11-14 DIAGNOSIS — H524 Presbyopia: Secondary | ICD-10-CM | POA: Diagnosis not present

## 2022-11-14 DIAGNOSIS — H52223 Regular astigmatism, bilateral: Secondary | ICD-10-CM | POA: Diagnosis not present

## 2022-11-14 DIAGNOSIS — H5203 Hypermetropia, bilateral: Secondary | ICD-10-CM | POA: Diagnosis not present

## 2022-11-19 DIAGNOSIS — R051 Acute cough: Secondary | ICD-10-CM | POA: Diagnosis not present

## 2022-11-19 DIAGNOSIS — J069 Acute upper respiratory infection, unspecified: Secondary | ICD-10-CM | POA: Diagnosis not present

## 2022-11-19 DIAGNOSIS — R110 Nausea: Secondary | ICD-10-CM | POA: Diagnosis not present

## 2022-11-28 DIAGNOSIS — L299 Pruritus, unspecified: Secondary | ICD-10-CM | POA: Diagnosis not present

## 2022-11-28 DIAGNOSIS — M6281 Muscle weakness (generalized): Secondary | ICD-10-CM | POA: Diagnosis not present

## 2022-11-28 DIAGNOSIS — R051 Acute cough: Secondary | ICD-10-CM | POA: Diagnosis not present

## 2022-11-28 DIAGNOSIS — R110 Nausea: Secondary | ICD-10-CM | POA: Diagnosis not present

## 2022-11-28 DIAGNOSIS — R5383 Other fatigue: Secondary | ICD-10-CM | POA: Diagnosis not present

## 2022-12-22 DIAGNOSIS — E559 Vitamin D deficiency, unspecified: Secondary | ICD-10-CM | POA: Diagnosis not present

## 2022-12-22 DIAGNOSIS — R7303 Prediabetes: Secondary | ICD-10-CM | POA: Diagnosis not present

## 2022-12-22 DIAGNOSIS — Z923 Personal history of irradiation: Secondary | ICD-10-CM | POA: Diagnosis not present

## 2022-12-22 DIAGNOSIS — Z79899 Other long term (current) drug therapy: Secondary | ICD-10-CM | POA: Diagnosis not present

## 2022-12-22 DIAGNOSIS — Z853 Personal history of malignant neoplasm of breast: Secondary | ICD-10-CM | POA: Diagnosis not present

## 2022-12-22 DIAGNOSIS — G9331 Postviral fatigue syndrome: Secondary | ICD-10-CM | POA: Diagnosis not present

## 2022-12-22 DIAGNOSIS — E7800 Pure hypercholesterolemia, unspecified: Secondary | ICD-10-CM | POA: Diagnosis not present

## 2022-12-22 DIAGNOSIS — G9339 Other post infection and related fatigue syndromes: Secondary | ICD-10-CM | POA: Diagnosis not present

## 2023-01-18 DIAGNOSIS — R29898 Other symptoms and signs involving the musculoskeletal system: Secondary | ICD-10-CM | POA: Diagnosis not present

## 2023-01-18 DIAGNOSIS — W19XXXA Unspecified fall, initial encounter: Secondary | ICD-10-CM | POA: Diagnosis not present

## 2023-01-18 DIAGNOSIS — R300 Dysuria: Secondary | ICD-10-CM | POA: Diagnosis not present

## 2023-01-18 DIAGNOSIS — M791 Myalgia, unspecified site: Secondary | ICD-10-CM | POA: Diagnosis not present

## 2023-02-05 ENCOUNTER — Other Ambulatory Visit: Payer: Self-pay | Admitting: Registered Nurse

## 2023-02-05 ENCOUNTER — Ambulatory Visit: Payer: Medicare Other | Admitting: Physical Therapy

## 2023-02-05 DIAGNOSIS — S0990XA Unspecified injury of head, initial encounter: Secondary | ICD-10-CM | POA: Diagnosis not present

## 2023-02-05 DIAGNOSIS — I10 Essential (primary) hypertension: Secondary | ICD-10-CM | POA: Diagnosis not present

## 2023-02-05 DIAGNOSIS — R519 Headache, unspecified: Secondary | ICD-10-CM | POA: Diagnosis not present

## 2023-02-06 ENCOUNTER — Ambulatory Visit
Admission: RE | Admit: 2023-02-06 | Discharge: 2023-02-06 | Disposition: A | Discharge status: Home / Self Care | Payer: Medicare Other | Source: Ambulatory Visit | Attending: Registered Nurse | Admitting: Registered Nurse

## 2023-02-06 DIAGNOSIS — R519 Headache, unspecified: Secondary | ICD-10-CM

## 2023-02-06 DIAGNOSIS — S0990XA Unspecified injury of head, initial encounter: Secondary | ICD-10-CM | POA: Diagnosis not present

## 2023-02-13 DIAGNOSIS — H2513 Age-related nuclear cataract, bilateral: Secondary | ICD-10-CM | POA: Diagnosis not present

## 2023-02-13 DIAGNOSIS — H25043 Posterior subcapsular polar age-related cataract, bilateral: Secondary | ICD-10-CM | POA: Diagnosis not present

## 2023-02-13 DIAGNOSIS — H25013 Cortical age-related cataract, bilateral: Secondary | ICD-10-CM | POA: Diagnosis not present

## 2023-02-13 DIAGNOSIS — H18413 Arcus senilis, bilateral: Secondary | ICD-10-CM | POA: Diagnosis not present

## 2023-02-13 DIAGNOSIS — H2512 Age-related nuclear cataract, left eye: Secondary | ICD-10-CM | POA: Diagnosis not present

## 2023-02-19 DIAGNOSIS — R29898 Other symptoms and signs involving the musculoskeletal system: Secondary | ICD-10-CM | POA: Diagnosis not present

## 2023-02-19 DIAGNOSIS — I10 Essential (primary) hypertension: Secondary | ICD-10-CM | POA: Diagnosis not present

## 2023-02-19 DIAGNOSIS — I6529 Occlusion and stenosis of unspecified carotid artery: Secondary | ICD-10-CM | POA: Diagnosis not present

## 2023-02-19 DIAGNOSIS — M542 Cervicalgia: Secondary | ICD-10-CM | POA: Diagnosis not present

## 2023-02-21 DIAGNOSIS — H2512 Age-related nuclear cataract, left eye: Secondary | ICD-10-CM | POA: Diagnosis not present

## 2023-02-21 NOTE — Progress Notes (Incomplete)
Patient Care Team: [name], MD as PCP - General (Internal Medicine) Nicholas Lose, MD as Consulting Physician (Hematology and Oncology) Eppie Gibson, MD as Attending Physician (Radiation Oncology)  DIAGNOSIS: No diagnosis found.  SUMMARY OF ONCOLOGIC HISTORY: Oncology History  Malignant neoplasm of upper-outer quadrant of left breast in female, estrogen receptor negative ([place])  06/23/2021 Initial Diagnosis   Patient felt left axillary lump which led to mammogram and ultrasound that revealed 4.1 cm left breast mass with a 6.7 cm left axillary lymph node.  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  Lymphovascular invasion noted, ER 0%, PR 0%, HER2 negative   07/12/2021 Cancer Staging   Staging form: Breast, AJCC 8th Edition - Clinical: Stage IIIC (cT2, cN2, cM0, G3, ER-, PR-, HER2-) - Signed by Nicholas Lose, MD on 07/12/2021 Histologic grading system: 3 grade system   07/21/2021 - 07/21/2021 Chemotherapy   1 cycle of Taxol carboplatin and Keytruda.  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 The report date is 08/25/2021.  The Multi-Cancer Panel + RNA offered by [facility] includes sequencing and/or deletion duplication testing of the following 84 genes: AIP, ALK, APC, ATM, AXIN2,BAP1,  BARD1, BLM, BMPR1A, BRCA1, BRCA2, BRIP1, CASR, CDC73, CDH1, CDK4, CDKN1B, CDKN1C, CDKN2A (p14ARF), CDKN2A (p16INK4a), CEBPA, CHEK2, CTNNA1, DICER1, DIS3L2, EGFR (c.2369C>T, p.Thr790Met variant only), EPCAM (Deletion/duplication testing only), FH, FLCN, GATA2, GPC3, GREM1 (Promoter region deletion/duplication testing only), HOXB13 (c.251G>A,  p.Gly84Glu), HRAS, KIT, MAX, MEN1, MET, MITF (c.952G>A, p.Glu318Lys variant only), MLH1, MSH2, MSH3, MSH6, MUTYH, NBN, NF1, NF2, NTHL1, PALB2, PDGFRA, PHOX2B, PMS2, POLD1, POLE, POT1, PRKAR1A, PTCH1, PTEN, RAD50, RAD51C, RAD51D, RB1, RECQL4, RET, RUNX1, SDHAF2, SDHA (sequence changes only), SDHB, SDHC, SDHD, SMAD4, SMARCA4, SMARCB1, SMARCE1, STK11, SUFU, TERC, TERT, TMEM127, TP53, TSC1, TSC2, VHL, WRN and WT1.   10/10/2021 - 11/11/2021 Radiation Therapy   Site Technique Total Dose (Gy) Dose per Fx (Gy) Completed Fx Beam Energies  Chest Wall, Left: CW_Lt 3D 50/50 2 25/25 6X  Chest Wall, Left: CW_Lt_SCV_PAB 3D 50/50 2 25/25 6X, 10X       CHIEF COMPLIANT: Follow-up of left breast cancer   INTERVAL HISTORY: Tina Ellis is a  82 y.o. with above-mentioned history of left breast cancer having undergone left mastectomy, currently undergoing radiation therapy. She presents to the clinic today for follow-up.      ALLERGIES:  is allergic to conjugated estrogens, fish oil, nitrofurantoin, benadryl itch stopping [diphenhydramine hcl], other, and pneumococcal 13-val conj vacc.  MEDICATIONS:  Current Outpatient Medications  Medication Sig Dispense Refill   amLODipine (NORVASC) 2.5 MG tablet Take 2.5 mg by mouth daily.     aspirin EC 81 MG EC tablet Take 1 tablet (81 mg total) by mouth daily.     carvedilol (COREG) 12.5 MG tablet Take 1 tablet (12.5 mg total) by mouth 2 (two) times daily with a meal. (Patient taking differently: Take 3.125 mg by mouth 2 (two) times daily with a meal.) 60 tablet 0   cetirizine (ZYRTEC) 10 MG tablet Take 10 mg by mouth at bedtime.     cholecalciferol (VITAMIN D) 1000 UNITS tablet Take 2,000 Units by mouth every morning.     hydrochlorothiazide (HYDRODIURIL) 12.5 MG tablet Take 12.5  mg by mouth daily.     losartan (COZAAR) 100 MG tablet Take 1 tablet by mouth daily.     omeprazole (PRILOSEC) 20 MG capsule Take 20 mg by mouth every morning.      rosuvastatin (CRESTOR) 10  MG tablet Take 1 tablet (10 mg total) by mouth every evening. 30 tablet 0   No current facility-administered medications for this visit.    PHYSICAL EXAMINATION: ECOG PERFORMANCE STATUS: {CHL ONC ECOG [phone]}  There were no vitals filed for this visit. There were no vitals filed for this visit.  BREAST:*** No palpable masses or nodules in either right or left breasts. No palpable axillary supraclavicular or infraclavicular adenopathy no breast tenderness or nipple discharge. (exam performed in the presence of a chaperone)  LABORATORY DATA:  I have reviewed the data as listed    Latest Ref Rng & Units 09/05/2022    2:50 PM 07/26/2021    9:50 AM 07/21/2021   11:34 AM  CMP  Glucose 70 - 99 mg/dL  193  95   BUN 8 - 23 mg/dL  29  21   Creatinine 0.44 - 1.00 mg/dL 1.00  0.98  0.98   Sodium 135 - 145 mmol/L  135  140   Potassium 3.5 - 5.1 mmol/L  3.6  4.0   Chloride 98 - 111 mmol/L  100  106   CO2 22 - 32 mmol/L  25  26   Calcium 8.9 - 10.3 mg/dL  9.2  9.8   Total Protein 6.5 - 8.1 g/dL  6.6  7.0   Total Bilirubin 0.3 - 1.2 mg/dL  1.0  0.5   Alkaline Phos 38 - 126 U/L  41  53   AST 15 - 41 U/L  20  15   ALT 0 - 44 U/L  11  <6     Lab Results  Component Value Date   WBC 5.4 07/26/2021   HGB 13.4 07/26/2021   HCT 38.6 07/26/2021   MCV 87.1 07/26/2021   PLT 234 07/26/2021   NEUTROABS 3.6 07/26/2021    ASSESSMENT & PLAN:  No problem-specific Assessment & Plan notes found for this encounter.    No orders of the defined types were placed in this encounter.  The patient has a good understanding of the overall plan. she agrees with it. she will call with any problems that may develop before the next visit here. Total time spent: 30 mins including face to face time and time spent for planning, charting and co-ordination of care   Suzzette Righter, [place] 02/21/23    I Gardiner Coins am acting as a Education administrator for [employer]  ***

## 2023-02-22 ENCOUNTER — Inpatient Hospital Stay: Payer: Medicare Other | Attending: Hematology and Oncology | Admitting: Hematology and Oncology

## 2023-02-22 NOTE — Assessment & Plan Note (Deleted)
06/23/2021: Patient felt left axillary lump which led to mammogram and ultrasound that revealed 4.1 cm left breast mass with a 6.7 cm left axillary lymph node.  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  Lymphovascular invasion noted, ER 0%, PR 0%, HER2 negative   Staging: CT abdomen pelvis 04/27/2021: No evidence of metastatic disease. CT head 04/27/2021: Performed for dizziness: Stable chronic small vessel ischemic changes    Recommendation 1.  Neoadjuvant chemotherapy and immunotherapy with Taxol carbo Keytruda  X 1 (discontinued for adverse effects) 2. followed by surgery with left mastectomy with ALND: At [facility]: Complete PR (Dr.Tjoe) 3.  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 The report date is 08/25/2021. ____________________________________________________ Breast cancer surveillance: Breast exam 02/22/2023: Benign Right mammogram 07/05/2022: Benign at [place]  Return to clinic in 1 year for follow-up

## 2023-02-28 DIAGNOSIS — H5202 Hypermetropia, left eye: Secondary | ICD-10-CM | POA: Diagnosis not present

## 2023-02-28 DIAGNOSIS — H52222 Regular astigmatism, left eye: Secondary | ICD-10-CM | POA: Diagnosis not present

## 2023-02-28 DIAGNOSIS — H2512 Age-related nuclear cataract, left eye: Secondary | ICD-10-CM | POA: Diagnosis not present

## 2023-03-06 ENCOUNTER — Ambulatory Visit (HOSPITAL_COMMUNITY): Payer: Medicare Other

## 2023-03-20 ENCOUNTER — Ambulatory Visit (HOSPITAL_COMMUNITY)
Admission: RE | Admit: 2023-03-20 | Discharge: 2023-03-20 | Disposition: A | Discharge status: Home / Self Care | Payer: Medicare Other | Source: Ambulatory Visit | Attending: Adult Health | Admitting: Adult Health

## 2023-03-20 DIAGNOSIS — C50412 Malignant neoplasm of upper-outer quadrant of left female breast: Secondary | ICD-10-CM | POA: Diagnosis not present

## 2023-03-20 DIAGNOSIS — Z171 Estrogen receptor negative status [ER-]: Secondary | ICD-10-CM | POA: Insufficient documentation

## 2023-03-20 DIAGNOSIS — R918 Other nonspecific abnormal finding of lung field: Secondary | ICD-10-CM | POA: Diagnosis not present

## 2023-03-20 MED ORDER — SODIUM CHLORIDE (PF) 0.9 % IJ SOLN
INTRAMUSCULAR | Status: AC
  Filled 2023-03-20: qty 50

## 2023-03-20 MED ORDER — IOHEXOL 300 MG/ML  SOLN
75.0000 mL | Freq: Once | INTRAMUSCULAR | Status: AC | PRN
  Administered 2023-03-20: 75 mL via INTRAVENOUS

## 2023-03-21 ENCOUNTER — Telehealth: Payer: Self-pay

## 2023-03-21 NOTE — Telephone Encounter (Signed)
Called pt X2 no answer and no VM available to leave a message. Routing to oncoming nurse to follow up.

## 2023-03-21 NOTE — Telephone Encounter (Signed)
-----   Message from Gardenia Phlegm, NP sent at 03/21/2023  3:21 PM EDT ----- Please let patient know that the scan looks stable ----- Message ----- From: Interface, Rad Results In Sent: 03/21/2023  12:20 PM EDT To: Gardenia Phlegm, NP

## 2023-03-21 NOTE — Telephone Encounter (Signed)
Pt called back for results. Pt given results per NP. She verbalized thanks and understanding.

## 2023-04-05 DIAGNOSIS — I10 Essential (primary) hypertension: Secondary | ICD-10-CM | POA: Diagnosis not present

## 2023-04-05 DIAGNOSIS — R29898 Other symptoms and signs involving the musculoskeletal system: Secondary | ICD-10-CM | POA: Diagnosis not present

## 2023-04-05 DIAGNOSIS — R634 Abnormal weight loss: Secondary | ICD-10-CM | POA: Diagnosis not present

## 2023-04-26 DIAGNOSIS — Z853 Personal history of malignant neoplasm of breast: Secondary | ICD-10-CM | POA: Diagnosis not present

## 2023-04-26 DIAGNOSIS — I10 Essential (primary) hypertension: Secondary | ICD-10-CM | POA: Diagnosis not present

## 2023-05-07 DIAGNOSIS — E7800 Pure hypercholesterolemia, unspecified: Secondary | ICD-10-CM | POA: Diagnosis not present

## 2023-05-07 DIAGNOSIS — R319 Hematuria, unspecified: Secondary | ICD-10-CM | POA: Diagnosis not present

## 2023-05-07 DIAGNOSIS — E559 Vitamin D deficiency, unspecified: Secondary | ICD-10-CM | POA: Diagnosis not present

## 2023-05-07 DIAGNOSIS — I10 Essential (primary) hypertension: Secondary | ICD-10-CM | POA: Diagnosis not present

## 2023-05-14 DIAGNOSIS — N390 Urinary tract infection, site not specified: Secondary | ICD-10-CM | POA: Diagnosis not present

## 2023-05-14 DIAGNOSIS — I10 Essential (primary) hypertension: Secondary | ICD-10-CM | POA: Diagnosis not present

## 2023-05-14 DIAGNOSIS — K219 Gastro-esophageal reflux disease without esophagitis: Secondary | ICD-10-CM | POA: Diagnosis not present

## 2023-05-14 DIAGNOSIS — E7800 Pure hypercholesterolemia, unspecified: Secondary | ICD-10-CM | POA: Diagnosis not present

## 2023-05-14 DIAGNOSIS — Z0000 Encounter for general adult medical examination without abnormal findings: Secondary | ICD-10-CM | POA: Diagnosis not present

## 2023-05-14 DIAGNOSIS — E559 Vitamin D deficiency, unspecified: Secondary | ICD-10-CM | POA: Diagnosis not present

## 2023-05-31 ENCOUNTER — Other Ambulatory Visit: Payer: Self-pay | Admitting: Hematology and Oncology

## 2023-05-31 DIAGNOSIS — Z1231 Encounter for screening mammogram for malignant neoplasm of breast: Secondary | ICD-10-CM

## 2023-05-31 DIAGNOSIS — Z853 Personal history of malignant neoplasm of breast: Secondary | ICD-10-CM

## 2023-06-11 DIAGNOSIS — K649 Unspecified hemorrhoids: Secondary | ICD-10-CM | POA: Diagnosis not present

## 2023-06-12 ENCOUNTER — Encounter (HOSPITAL_COMMUNITY): Payer: Self-pay

## 2023-06-12 ENCOUNTER — Emergency Department (HOSPITAL_COMMUNITY): Payer: Medicare Other

## 2023-06-12 ENCOUNTER — Emergency Department (HOSPITAL_COMMUNITY)
Admission: EM | Admit: 2023-06-12 | Discharge: 2023-06-12 | Disposition: A | Discharge status: Home / Self Care | Payer: Medicare Other | Attending: Emergency Medicine | Admitting: Emergency Medicine

## 2023-06-12 ENCOUNTER — Other Ambulatory Visit: Payer: Self-pay

## 2023-06-12 DIAGNOSIS — R197 Diarrhea, unspecified: Secondary | ICD-10-CM

## 2023-06-12 DIAGNOSIS — Z7982 Long term (current) use of aspirin: Secondary | ICD-10-CM | POA: Diagnosis not present

## 2023-06-12 DIAGNOSIS — K6289 Other specified diseases of anus and rectum: Secondary | ICD-10-CM | POA: Insufficient documentation

## 2023-06-12 DIAGNOSIS — K626 Ulcer of anus and rectum: Secondary | ICD-10-CM

## 2023-06-12 DIAGNOSIS — K51911 Ulcerative colitis, unspecified with rectal bleeding: Secondary | ICD-10-CM | POA: Insufficient documentation

## 2023-06-12 DIAGNOSIS — Z79899 Other long term (current) drug therapy: Secondary | ICD-10-CM | POA: Insufficient documentation

## 2023-06-12 DIAGNOSIS — Z853 Personal history of malignant neoplasm of breast: Secondary | ICD-10-CM | POA: Diagnosis not present

## 2023-06-12 DIAGNOSIS — K5730 Diverticulosis of large intestine without perforation or abscess without bleeding: Secondary | ICD-10-CM | POA: Diagnosis not present

## 2023-06-12 DIAGNOSIS — D72829 Elevated white blood cell count, unspecified: Secondary | ICD-10-CM | POA: Insufficient documentation

## 2023-06-12 LAB — CBC WITH DIFFERENTIAL/PLATELET
Abs Immature Granulocytes: 0.05 10*3/uL (ref 0.00–0.07)
Basophils Absolute: 0 10*3/uL (ref 0.0–0.1)
Basophils Relative: 0 %
Eosinophils Absolute: 0 10*3/uL (ref 0.0–0.5)
Eosinophils Relative: 0 %
HCT: 42 % (ref 36.0–46.0)
Hemoglobin: 14.1 g/dL (ref 12.0–15.0)
Immature Granulocytes: 0 %
Lymphocytes Relative: 8 %
Lymphs Abs: 1.1 10*3/uL (ref 0.7–4.0)
MCH: 30.6 pg (ref 26.0–34.0)
MCHC: 33.6 g/dL (ref 30.0–36.0)
MCV: 91.1 fL (ref 80.0–100.0)
Monocytes Absolute: 1.3 10*3/uL — ABNORMAL HIGH (ref 0.1–1.0)
Monocytes Relative: 10 %
Neutro Abs: 11.6 10*3/uL — ABNORMAL HIGH (ref 1.7–7.7)
Neutrophils Relative %: 82 %
Platelets: 254 10*3/uL (ref 150–400)
RBC: 4.61 MIL/uL (ref 3.87–5.11)
RDW: 13.2 % (ref 11.5–15.5)
WBC: 14.1 10*3/uL — ABNORMAL HIGH (ref 4.0–10.5)
nRBC: 0 % (ref 0.0–0.2)

## 2023-06-12 LAB — COMPREHENSIVE METABOLIC PANEL
ALT: 15 U/L (ref 0–44)
AST: 62 U/L — ABNORMAL HIGH (ref 15–41)
Albumin: 3.9 g/dL (ref 3.5–5.0)
Alkaline Phosphatase: 43 U/L (ref 38–126)
Anion gap: 11 (ref 5–15)
BUN: 18 mg/dL (ref 8–23)
CO2: 25 mmol/L (ref 22–32)
Calcium: 9.5 mg/dL (ref 8.9–10.3)
Chloride: 100 mmol/L (ref 98–111)
Creatinine, Ser: 0.93 mg/dL (ref 0.44–1.00)
GFR, Estimated: >60 mL/min (ref >60)
Glucose, Bld: 134 mg/dL — ABNORMAL HIGH (ref 70–99)
Potassium: 3.3 mmol/L — ABNORMAL LOW (ref 3.5–5.1)
Sodium: 136 mmol/L (ref 135–145)
Total Bilirubin: 1 mg/dL (ref 0.3–1.2)
Total Protein: 7.4 g/dL (ref 6.5–8.1)

## 2023-06-12 MED ORDER — SODIUM CHLORIDE 0.9 % IV BOLUS
500.0000 mL | Freq: Once | INTRAVENOUS | Status: AC
  Administered 2023-06-12: 500 mL via INTRAVENOUS

## 2023-06-12 MED ORDER — CEFDINIR 300 MG PO CAPS: 300.0000 mg | ORAL_CAPSULE | Freq: Two times a day (BID) | ORAL | 0 refills | Status: DC

## 2023-06-12 MED ORDER — IOHEXOL 300 MG/ML  SOLN
100.0000 mL | Freq: Once | INTRAMUSCULAR | Status: AC | PRN
  Administered 2023-06-12: 100 mL via INTRAVENOUS

## 2023-06-12 MED ORDER — METRONIDAZOLE 500 MG PO TABS: 500.0000 mg | ORAL_TABLET | Freq: Two times a day (BID) | ORAL | 0 refills | Status: AC

## 2023-06-12 NOTE — ED Triage Notes (Signed)
Patient is here for evaluation of diarrhea X 24 hours. Reports pain when having bowel movement. Reports her diarrhea has a very bad smell, was on antibiotics but has been off of meds for 2 weeks.

## 2023-06-12 NOTE — Discharge Instructions (Addendum)
Please make sure that you follow-up with your primary care doctor.  Make sure you are using sitz bath's, to help with your anal discomfort.  Please do not use the hydrocortisone anymore, you can use a lubricated jelly to help with the moisture around your anus given the ulceration.  If you develop fever, chills, worsening diarrhea, severe abdominal pain please return to the ER.

## 2023-06-12 NOTE — ED Provider Notes (Signed)
[place] EMERGENCY DEPARTMENT AT [facility] Provider Note   CSN: [id number] Arrival date & time: 06/12/23  1443     History  Chief Complaint  Patient presents with   Diarrhea    Tina Ellis is a 82 y.o. female, history of breast cancer in remission, who presents to the ED secondary to diffuse diarrhea for the last 24 hours.  She states that about 24 hours ago she started having yellow diarrhea, that is just feeling out of her, she states he is having hourly episodes of diarrhea, and that her anus hurts a lot.  She went to urgent care, and was told to use some cream on her anus.  She is use a cream without any relief.  She reports that she does not have any abdominal pain, but does endorse nausea, reduced appetite.  Has not had any kind of new foods, but does endorse being on antibiotic for a UTI about 2 weeks ago.  Denies any fevers or chills.     Home Medications Prior to Admission medications   Medication Sig Start Date End Date Taking? Authorizing Provider  cefdinir (OMNICEF) 300 MG capsule Take 1 capsule (300 mg total) by mouth 2 (two) times daily. 06/12/23  Yes Alyiah Ulloa L, PA  metroNIDAZOLE (FLAGYL) 500 MG tablet Take 1 tablet (500 mg total) by mouth 2 (two) times daily. 06/12/23  Yes Ahmaad Neidhardt L, PA  amLODipine (NORVASC) 2.5 MG tablet Take 2.5 mg by mouth daily. 06/30/21   [provider]  aspirin EC 81 MG EC tablet Take 1 tablet (81 mg total) by mouth daily. 07/12/21   Serena Croissant, MD  carvedilol (COREG) 12.5 MG tablet Take 1 tablet (12.5 mg total) by mouth 2 (two) times daily with a meal. Patient taking differently: Take 3.125 mg by mouth 2 (two) times daily with a meal. 04/10/17   Maxie Barb, MD  cetirizine (ZYRTEC) 10 MG tablet Take 10 mg by mouth at bedtime.    [provider]  cholecalciferol (VITAMIN D) 1000 UNITS tablet Take 2,000 Units by mouth every morning.    [provider]  hydrochlorothiazide (HYDRODIURIL)  12.5 MG tablet Take 12.5 mg by mouth daily. 06/30/21   [provider]  losartan (COZAAR) 100 MG tablet Take 1 tablet by mouth daily.    [provider]  omeprazole (PRILOSEC) 20 MG capsule Take 20 mg by mouth every morning.     [provider]  rosuvastatin (CRESTOR) 10 MG tablet Take 1 tablet (10 mg total) by mouth every evening. 04/10/17   Maxie Barb, MD  prochlorperazine (COMPAZINE) 10 MG tablet Take 1 tablet (10 mg total) by mouth every 6 (six) hours as needed (Nausea or vomiting). 07/15/21 07/26/21  Serena Croissant, MD      Allergies    Conjugated estrogens, Fish oil, Nitrofurantoin, Benadryl itch stopping [diphenhydramine hcl], Other, and Pneumococcal 13-val conj vacc    Review of Systems   Review of Systems  Gastrointestinal:  Positive for diarrhea and nausea. Negative for abdominal pain.    Physical Exam Updated Vital Signs BP (!) 110/53 (BP Location: Left Arm)   Pulse 77   Temp 98 F (36.7 C) (Oral)   Resp 17   Ht 5\' 6"  (1.676 m)   Wt 60.8 kg   SpO2 100%   BMI 21.63 kg/m  Physical Exam Vitals and nursing note reviewed.  Constitutional:      General: She is not in acute distress.  Appearance: She is well-developed.  HENT:     Head: Normocephalic and atraumatic.  Eyes:     Conjunctiva/sclera: Conjunctivae normal.  Cardiovascular:     Rate and Rhythm: Normal rate and regular rhythm.     Heart sounds: No murmur heard. Pulmonary:     Effort: Pulmonary effort is normal. No respiratory distress.     Breath sounds: Normal breath sounds.  Abdominal:     Palpations: Abdomen is soft.     Tenderness: There is no abdominal tenderness.  Genitourinary:    Comments: Rectum: inflamed, no evidence of hemorrhoids or prolapse, yellow liquid stool around anus. Chaperone present. Severe ttp of the anus. Musculoskeletal:        General: No swelling.     Cervical back: Neck supple.  Skin:    General: Skin is warm and dry.     Capillary Refill:  Capillary refill takes less than 2 seconds.  Neurological:     Mental Status: She is alert.  Psychiatric:        Mood and Affect: Mood normal.     ED Results / Procedures / Treatments   Labs (all labs ordered are listed, but only abnormal results are displayed) Labs Reviewed  CBC WITH DIFFERENTIAL/PLATELET - Abnormal; Notable for the following components:      Result Value   WBC 14.1 (*)    Neutro Abs 11.6 (*)    Monocytes Absolute 1.3 (*)    All other components within normal limits  COMPREHENSIVE METABOLIC PANEL - Abnormal; Notable for the following components:   Potassium 3.3 (*)    Glucose, Bld 134 (*)    AST 62 (*)    All other components within normal limits  C DIFFICILE QUICK SCREEN W PCR REFLEX      EKG None  Radiology CT ABDOMEN PELVIS W CONTRAST  Result Date: 06/12/2023 CLINICAL DATA:  Stage IV breast cancer. Painful and malodorous diarrhea over the last 24 hours; recent antibiotic therapy. * Tracking Code: BO * EXAM: CT ABDOMEN AND PELVIS WITH CONTRAST TECHNIQUE: Multidetector CT imaging of the abdomen and pelvis was performed using the standard protocol following bolus administration of intravenous contrast. RADIATION DOSE REDUCTION: This exam was performed according to the departmental dose-optimization program which includes automated exposure control, adjustment of the mA and/or kV according to patient size and/or use of iterative reconstruction technique. CONTRAST:  OMNIPAQUE IOHEXOL 300 MG/ML  SOLN COMPARISON:  04/27/2021 FINDINGS: Lower chest: Descending thoracic aortic atherosclerotic vascular calcification. Hepatobiliary: Cholecystectomy. Common bile duct 0.8 cm in diameter, within normal limits for age and cholecystectomy status. Pancreas: Nesiah Jump benign lipoma along the pancreatic margin on image 38 series 7, otherwise unremarkable. Spleen: Unremarkable Adrenals/Urinary Tract: Scarring of the right kidney upper pole. No acute findings. Adrenal glands  unremarkable. Stomach/Bowel: Substantial abnormal mucosal enhancement and wall thickening in the lower rectum and anus with associated presacral and perirectal edema and questionable anterior ulceration along the anorectal junction for example on image 90 of series 9. Given the degree of inflammation in the region, perianal fistula is difficult to completely exclude. There is sigmoid colon diverticulosis but no findings of active diverticulitis. Formed stool is present in the colon extending to the sigmoid, although the inflamed rectum is relatively empty. There is some chronic redundancy of the stomach along the lesser curvature Vascular/Lymphatic: Atherosclerosis is present, including aortoiliac atherosclerotic disease. Atheromatous plaque contributes to narrowing of the proximal celiac trunk. There is mild dorsal atheromatous plaque proximally in the superior mesenteric artery along with  marginal atheromatous plaque further distally in the SMA. No overt occlusion. No pathologic adenopathy. Reproductive: Uterus absent.  Adnexa unremarkable. Other: No supplemental non-categorized findings. Musculoskeletal: Grade 1 degenerative anterolisthesis at L4-5. Lumbar spondylosis and degenerative disc disease with mild left foraminal impingement at the L3-4 level. IMPRESSION: 1. Substantial abnormal mucosal enhancement and wall thickening in the lower rectum and anus with associated presacral and perirectal edema and questionable anterior ulceration along the anorectal junction. Appearance compatible with proctitis. 2. Sigmoid colon diverticulosis without active diverticulitis. 3. Aortic and systemic atherosclerosis. 4. Lumbar spondylosis and degenerative disc disease with mild left foraminal impingement at L3-4. Aortic Atherosclerosis (ICD10-I70.0). Electronically Signed   By: Gaylyn Rong M.D.   On: 06/12/2023 17:42    Procedures Procedures    Medications Ordered in ED Medications  sodium chloride 0.9 % bolus  500 mL (0 mLs Intravenous Stopped 06/12/23 1820)  iohexol (OMNIPAQUE) 300 MG/ML solution 100 mL (100 mLs Intravenous Contrast Given 06/12/23 1648)    ED Course/ Medical Decision Making/ A&P                             Medical Decision Making Patient is an 82 year old female, here for 24 episodes of diarrhea in the last 24 hours.  She states that she is having profuse diarrhea, but denies any nausea, vomiting, abdominal pain.  Denies any new foods current, or recent illnesses.  Does state that she took an antibiotic a couple weeks ago, but was fine until now.  We will obtain a CT abdomen pelvis, given the tenderness to palpation of her anus, as well as obtain some blood work.  Amount and/or Complexity of Data Reviewed Labs: ordered.    Details: Mild leukocytosis of 14.1 K Radiology: ordered.    Details: CT abdomen pelvis, shows proctitis, with possible anal ulceration. Discussion of management or test interpretation with external provider(s): Discussed with patient and she does not engage in anal sex, she denies any kind of trauma to her anus.  Just has been having the diarrhea.  She was unable to supply a stool sample in the 4 hours that she was here thus I think this is very unlikely to be C. difficile.  Given the proctitis, elderly status, and ulceration we will treat with cefdinir, Flagyl, and have her follow-up with her primary care doctor.  I stressed the importance of not continuing to use the hydrocortisone cream as this will breakdown her skin further, and to use sits baths, to help with this as well as water-based lubricant.  We discussed the importance of follow-up with primary care doctor and return precautions and she voiced understanding.  Risk Prescription drug management.    Final Clinical Impression(s) / ED Diagnoses Final diagnoses:  Proctitis  Ulceration of rectal mucous membrane  Diarrhea, unspecified type    Rx / DC Orders ED Discharge Orders          Ordered     cefdinir (OMNICEF) 300 MG capsule  2 times daily        06/12/23 1835    metroNIDAZOLE (FLAGYL) 500 MG tablet  2 times daily        06/12/23 1835              Manvi Guilliams, Harley [place], PA 06/12/23 1842    Melene Plan, DO 06/12/23 1845

## 2023-06-14 DIAGNOSIS — K6289 Other specified diseases of anus and rectum: Secondary | ICD-10-CM | POA: Diagnosis not present

## 2023-06-14 DIAGNOSIS — R197 Diarrhea, unspecified: Secondary | ICD-10-CM | POA: Diagnosis not present

## 2023-06-14 DIAGNOSIS — E876 Hypokalemia: Secondary | ICD-10-CM | POA: Diagnosis not present

## 2023-06-19 ENCOUNTER — Telehealth: Payer: Self-pay

## 2023-06-19 NOTE — Telephone Encounter (Signed)
Transition Care Management Follow-up Telephone Call Date of discharge and from where: 06/12/2023 [facility] How have you been since you were released from the hospital? Patient is feeling a little better. Any questions or concerns? No  Items Reviewed: Did the pt receive and understand the discharge instructions provided? Yes  Medications obtained and verified? Yes  Other?  Patient consented to [facility] referral being sent to local food pantries. Any new allergies since your discharge? No  Dietary orders reviewed? Yes Do you have support at home? Yes   Follow up appointments reviewed:  PCP Hospital f/u appt confirmed? No  Scheduled to see  on  @ . Specialist Hospital f/u app confirmed? Yes  Scheduled to see  on 07/09/2023 @ [facility]. Are transportation arrangements needed? No  If their condition worsens, is the pt aware to call PCP or go to the Emergency Dept.? Yes Was the patient provided with contact information for the PCP's office or ED? Yes Was to pt encouraged to call back with questions or concerns? Yes  Militza Devery [facility]  [facility] Population Health Community Resource Care Guide   ??[email or web address]  ?? [phone]   Website: [email or web address]  [email or web address]

## 2023-06-25 DIAGNOSIS — R9389 Abnormal findings on diagnostic imaging of other specified body structures: Secondary | ICD-10-CM | POA: Diagnosis not present

## 2023-06-25 DIAGNOSIS — K6289 Other specified diseases of anus and rectum: Secondary | ICD-10-CM | POA: Diagnosis not present

## 2023-06-25 DIAGNOSIS — K626 Ulcer of anus and rectum: Secondary | ICD-10-CM | POA: Diagnosis not present

## 2023-06-27 DIAGNOSIS — K5730 Diverticulosis of large intestine without perforation or abscess without bleeding: Secondary | ICD-10-CM | POA: Diagnosis not present

## 2023-06-27 DIAGNOSIS — R933 Abnormal findings on diagnostic imaging of other parts of digestive tract: Secondary | ICD-10-CM | POA: Diagnosis not present

## 2023-07-03 DIAGNOSIS — R933 Abnormal findings on diagnostic imaging of other parts of digestive tract: Secondary | ICD-10-CM | POA: Diagnosis not present

## 2023-07-09 ENCOUNTER — Ambulatory Visit: Payer: Medicare Other

## 2023-07-10 ENCOUNTER — Ambulatory Visit
Admission: RE | Admit: 2023-07-10 | Discharge: 2023-07-10 | Disposition: A | Discharge status: Home / Self Care | Payer: Medicare Other | Source: Ambulatory Visit | Attending: Hematology and Oncology | Admitting: Hematology and Oncology

## 2023-07-10 DIAGNOSIS — Z1231 Encounter for screening mammogram for malignant neoplasm of breast: Secondary | ICD-10-CM | POA: Diagnosis not present

## 2023-07-10 DIAGNOSIS — Z853 Personal history of malignant neoplasm of breast: Secondary | ICD-10-CM

## 2023-08-16 DIAGNOSIS — I10 Essential (primary) hypertension: Secondary | ICD-10-CM | POA: Diagnosis not present

## 2023-08-21 DIAGNOSIS — L820 Inflamed seborrheic keratosis: Secondary | ICD-10-CM | POA: Diagnosis not present

## 2023-08-21 DIAGNOSIS — I10 Essential (primary) hypertension: Secondary | ICD-10-CM | POA: Diagnosis not present

## 2023-08-25 ENCOUNTER — Telehealth: Payer: Self-pay | Admitting: Adult Health

## 2023-08-28 ENCOUNTER — Inpatient Hospital Stay: Payer: Medicare Other | Admitting: Adult Health

## 2023-09-10 DIAGNOSIS — J029 Acute pharyngitis, unspecified: Secondary | ICD-10-CM | POA: Diagnosis not present

## 2023-09-10 DIAGNOSIS — I10 Essential (primary) hypertension: Secondary | ICD-10-CM | POA: Diagnosis not present

## 2023-10-08 ENCOUNTER — Inpatient Hospital Stay: Payer: Medicare Other | Admitting: Adult Health

## 2023-11-13 DIAGNOSIS — R319 Hematuria, unspecified: Secondary | ICD-10-CM | POA: Diagnosis not present

## 2023-11-13 DIAGNOSIS — E7800 Pure hypercholesterolemia, unspecified: Secondary | ICD-10-CM | POA: Diagnosis not present

## 2023-11-13 DIAGNOSIS — N3000 Acute cystitis without hematuria: Secondary | ICD-10-CM | POA: Diagnosis not present

## 2023-11-16 ENCOUNTER — Encounter: Payer: Self-pay | Admitting: Adult Health

## 2023-11-16 ENCOUNTER — Inpatient Hospital Stay: Payer: Medicare Other | Attending: Adult Health | Admitting: Adult Health

## 2023-11-16 VITALS — BP 129/68 | HR 72 | Temp 97.5°F | Resp 16 | Wt 131.7 lb

## 2023-11-16 DIAGNOSIS — Z171 Estrogen receptor negative status [ER-]: Secondary | ICD-10-CM

## 2023-11-16 DIAGNOSIS — Z9221 Personal history of antineoplastic chemotherapy: Secondary | ICD-10-CM | POA: Insufficient documentation

## 2023-11-16 DIAGNOSIS — Z853 Personal history of malignant neoplasm of breast: Secondary | ICD-10-CM | POA: Insufficient documentation

## 2023-11-16 DIAGNOSIS — R918 Other nonspecific abnormal finding of lung field: Secondary | ICD-10-CM | POA: Insufficient documentation

## 2023-11-16 DIAGNOSIS — Z9012 Acquired absence of left breast and nipple: Secondary | ICD-10-CM | POA: Insufficient documentation

## 2023-11-16 DIAGNOSIS — C50412 Malignant neoplasm of upper-outer quadrant of left female breast: Secondary | ICD-10-CM | POA: Diagnosis not present

## 2023-11-16 DIAGNOSIS — R634 Abnormal weight loss: Secondary | ICD-10-CM | POA: Insufficient documentation

## 2023-11-16 NOTE — Assessment & Plan Note (Signed)
Breast Cancer Mammogram on July 17th showed no evidence of malignancy. Breast density category C. No new symptoms reported. -Continue annual mammograms. Next due July 2025.  Lung Nodules No new symptoms reported. Last CT scan in March showed no growth, suggesting non-malignant nodules. -Repeat CT scan in March 2025 to monitor nodules.  Unintentional Weight Loss Patient reports some weight loss. No new symptoms reported. Recent scans have been reassuring. -Monitor weight closely. If continued weight loss, consider further evaluation.  RTC in 1 year for continued follow up.
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
## 2023-11-20 DIAGNOSIS — I10 Essential (primary) hypertension: Secondary | ICD-10-CM | POA: Diagnosis not present

## 2023-11-20 DIAGNOSIS — N3001 Acute cystitis with hematuria: Secondary | ICD-10-CM | POA: Diagnosis not present

## 2023-11-20 DIAGNOSIS — E7800 Pure hypercholesterolemia, unspecified: Secondary | ICD-10-CM | POA: Diagnosis not present

## 2023-11-20 DIAGNOSIS — I700 Atherosclerosis of aorta: Secondary | ICD-10-CM | POA: Diagnosis not present

## 2023-11-20 DIAGNOSIS — K219 Gastro-esophageal reflux disease without esophagitis: Secondary | ICD-10-CM | POA: Diagnosis not present

## 2023-11-20 DIAGNOSIS — E559 Vitamin D deficiency, unspecified: Secondary | ICD-10-CM | POA: Diagnosis not present

## 2023-12-24 DIAGNOSIS — Z23 Encounter for immunization: Secondary | ICD-10-CM | POA: Diagnosis not present

## 2024-03-17 ENCOUNTER — Ambulatory Visit (HOSPITAL_COMMUNITY)
Admission: RE | Admit: 2024-03-17 | Discharge: 2024-03-17 | Disposition: A | Discharge status: Home / Self Care | Payer: Medicare Other | Source: Ambulatory Visit | Attending: Adult Health | Admitting: Adult Health

## 2024-03-17 DIAGNOSIS — R918 Other nonspecific abnormal finding of lung field: Secondary | ICD-10-CM | POA: Diagnosis not present

## 2024-03-17 DIAGNOSIS — I700 Atherosclerosis of aorta: Secondary | ICD-10-CM | POA: Diagnosis not present

## 2024-03-17 DIAGNOSIS — Z853 Personal history of malignant neoplasm of breast: Secondary | ICD-10-CM | POA: Diagnosis not present

## 2024-03-17 DIAGNOSIS — C50412 Malignant neoplasm of upper-outer quadrant of left female breast: Secondary | ICD-10-CM | POA: Insufficient documentation

## 2024-03-17 DIAGNOSIS — J8410 Pulmonary fibrosis, unspecified: Secondary | ICD-10-CM | POA: Diagnosis not present

## 2024-03-17 DIAGNOSIS — Z171 Estrogen receptor negative status [ER-]: Secondary | ICD-10-CM | POA: Diagnosis not present

## 2024-03-17 MED ORDER — IOHEXOL 300 MG/ML  SOLN
75.0000 mL | Freq: Once | INTRAMUSCULAR | Status: AC | PRN
  Administered 2024-03-17: 75 mL via INTRAVENOUS

## 2024-03-21 DIAGNOSIS — R5381 Other malaise: Secondary | ICD-10-CM | POA: Diagnosis not present

## 2024-03-21 DIAGNOSIS — Z9989 Dependence on other enabling machines and devices: Secondary | ICD-10-CM | POA: Diagnosis not present

## 2024-03-21 DIAGNOSIS — R29898 Other symptoms and signs involving the musculoskeletal system: Secondary | ICD-10-CM | POA: Diagnosis not present

## 2024-03-21 DIAGNOSIS — R350 Frequency of micturition: Secondary | ICD-10-CM | POA: Diagnosis not present

## 2024-04-01 ENCOUNTER — Telehealth: Payer: Self-pay | Admitting: *Deleted

## 2024-04-01 NOTE — Telephone Encounter (Signed)
Per Lindsey Causey, NP, called pt with message below. Pt verbalized understanding.  

## 2024-04-01 NOTE — Telephone Encounter (Signed)
-----   Message from Noreene Filbert sent at 03/31/2024 10:30 AM EDT ----- Scan demonstrates the lung nodules are stable and benign.  Please let patient know.  Thanks so much, LC ----- Message ----- From: Interface, Rad Results In Sent: 03/29/2024  12:18 AM EDT To: Loa Socks, NP

## 2024-04-03 ENCOUNTER — Ambulatory Visit: Admission: EM | Admit: 2024-04-03 | Discharge: 2024-04-03 | Disposition: A | Discharge status: Home / Self Care

## 2024-04-03 ENCOUNTER — Other Ambulatory Visit: Payer: Self-pay

## 2024-04-03 DIAGNOSIS — S0011XA Contusion of right eyelid and periocular area, initial encounter: Secondary | ICD-10-CM | POA: Diagnosis not present

## 2024-04-03 NOTE — Discharge Instructions (Addendum)
 You were seen today for bruising along the right eye.  At this time your exam is overall reassuring.  The bruising should start to resolve over the next week or so on its own.  You can use a cool compress to the area, 15 minutes on with at least 30 minutes off.  I recommend cool compress for the next day or so to help with swelling.  After this you can switch over to warm compresses on the area with the same time restrictions.  You can use Tylenol as needed for pain management.  If at any point you feel you are unable to move your eyes or there is intense pain with eye movement, vision changes, swelling to the area, intense headaches please go to the emergency room as these could be signs of a medical emergency.

## 2024-04-03 NOTE — ED Provider Notes (Signed)
 [name]    CSN: [id number] Arrival date & time: 04/03/24  1430      History   Chief Complaint Chief Complaint  Patient presents with   Eye Problem    HPI Tina Ellis is a 83 y.o. female.   HPI  She reports she was babysitting a dog on Monday and he accidentally hit her in the right eye  She reports having bruising around the area   She has applied cool compresses and ice packs  She denies falling or hitting her head. She denies injuring any other part of her body during the event   Past Medical History:  Diagnosis Date   Family history of breast cancer    Family history of colon cancer    Family history of lung cancer    GERD (gastroesophageal reflux disease)    Hypercholesteremia    Hypertension    left breast ca 06/2021   Port-A-Cath in place 07/21/2021   Seasonal allergies    TIA (transient ischemic attack)     Patient Active Problem List   Diagnosis Date Noted   Genetic testing 09/06/2021   Family history of colon cancer 07/18/2021   Family history of breast cancer 07/18/2021   Family history of lung cancer 07/18/2021   Malignant neoplasm of upper-outer quadrant of left breast in female, estrogen receptor negative (HCC) 07/12/2021   Elevated troponin 04/09/2017   TIA (transient ischemic attack) 04/08/2017   Abdominal pain    Stroke-like symptom 04/06/2017   HTN (hypertension) 04/06/2017   GERD (gastroesophageal reflux disease) 04/06/2017   Influenza A 12/15/2013   Hypotension 12/14/2013   Acute renal failure (HCC) 12/14/2013   Lower urinary tract infectious disease 12/13/2013   Hypotension, unspecified 12/13/2013   Dehydration 12/13/2013    Past Surgical History:  Procedure Laterality Date   BACK SURGERY     x 2   CHOLECYSTECTOMY     MASTECTOMY     PARTIAL HYSTERECTOMY      OB History   No obstetric history on file.      Home Medications    Prior to Admission medications   Medication Sig Start Date End Date  Taking? Authorizing Provider  fluticasone (FLONASE) 50 MCG/ACT nasal spray Place 1 spray into both nostrils daily. 12/24/23  Yes [provider]  amLODipine (NORVASC) 2.5 MG tablet Take 2.5 mg by mouth daily. 06/30/21   [provider]  aspirin EC 81 MG EC tablet Take 1 tablet (81 mg total) by mouth daily. 07/12/21   Serena Croissant, MD  carvedilol (COREG) 12.5 MG tablet Take 1 tablet (12.5 mg total) by mouth 2 (two) times daily with a meal. Patient taking differently: Take 3.125 mg by mouth 2 (two) times daily with a meal. 04/10/17   Maxie Barb, MD  cefdinir (OMNICEF) 300 MG capsule Take 1 capsule (300 mg total) by mouth 2 (two) times daily. 06/12/23   Small, Brooke L, PA  cetirizine (ZYRTEC) 10 MG tablet Take 10 mg by mouth at bedtime.    [provider]  cholecalciferol (VITAMIN D) 1000 UNITS tablet Take 2,000 Units by mouth every morning.    [provider]  Cholecalciferol 50 MCG (2000 UT) CAPS 2,000 capsules.    [provider]  hydrochlorothiazide (HYDRODIURIL) 12.5 MG tablet Take 12.5 mg by mouth daily. 1.5 only when it is high 06/30/21   [provider]  losartan (COZAAR) 100 MG tablet Take 1 tablet by mouth daily.    [provider]  metroNIDAZOLE (FLAGYL) 500 MG tablet Take 1 tablet (500 mg total) by mouth 2 (two) times daily. 06/12/23   Small, Brooke L, PA  omeprazole (PRILOSEC) 20 MG capsule Take 20 mg by mouth every morning.     [provider]  rosuvastatin (CRESTOR) 10 MG tablet Take 1 tablet (10 mg total) by mouth every evening. 04/10/17   Maxie Barb, MD  prochlorperazine (COMPAZINE) 10 MG tablet Take 1 tablet (10 mg total) by mouth every 6 (six) hours as needed (Nausea or vomiting). 07/15/21 07/26/21  Serena Croissant, MD    Family History Family History  Problem Relation Age of Onset   Colon cancer Mother    Lung cancer Father    Crohn's disease Brother     Social History Social History    Tobacco Use   Smoking status: Never   Smokeless tobacco: Never  Vaping Use   Vaping status: Never Used  Substance Use Topics   Alcohol use: No   Drug use: No     Allergies   Conjugated estrogens, Fish oil, Nitrofurantoin, Benadryl itch stopping [diphenhydramine hcl], Other, Pneumococcal 13-val conj vacc, and Pneumococcal 13-val conj vacc   Review of Systems Review of Systems  HENT:         Bruising along right eye      Physical Exam Triage Vital Signs ED Triage Vitals  Encounter Vitals Group     BP 04/03/24 1453 (!) 158/81     Systolic BP Percentile --      Diastolic BP Percentile --      Pulse Rate 04/03/24 1453 64     Resp 04/03/24 1453 17     Temp 04/03/24 1453 (!) 97.3 F (36.3 C)     Temp Source 04/03/24 1453 Oral     SpO2 04/03/24 1453 99 %     Weight 04/03/24 1524 135 lb (61.2 kg)     Height 04/03/24 1524 5\' 6"  (1.676 m)     Head Circumference --      Peak Flow --      Pain Score 04/03/24 1524 2     Pain Loc --      Pain Education --      Exclude from Growth Chart --    No data found.  Updated Vital Signs BP (!) 158/81 (BP Location: Right Arm)   Pulse 64   Temp (!) 97.3 F (36.3 C) (Oral)   Resp 17   Ht 5\' 6"  (1.676 m)   Wt 135 lb (61.2 kg)   SpO2 99%   BMI 21.79 kg/m   Visual Acuity Right Eye Distance:   Left Eye Distance:   Bilateral Distance:    Right Eye Near:   Left Eye Near:    Bilateral Near:     Physical Exam Vitals reviewed.  Constitutional:      General: She is awake.     Appearance: Normal appearance. She is well-developed and well-groomed.  HENT:     Head: Normocephalic. Contusion present.   Eyes:     General: Gaze aligned appropriately.        Right eye: No foreign body, discharge or hordeolum.        Left eye: No foreign body, discharge or hordeolum.     Extraocular Movements: Extraocular movements intact.     Right eye: Normal extraocular motion and no nystagmus.     Left eye: Normal extraocular motion and no  nystagmus.     Conjunctiva/sclera: Conjunctivae normal.  Right eye: Right conjunctiva is not injected. No chemosis, exudate or hemorrhage.    Left eye: Left conjunctiva is not injected. No chemosis, exudate or hemorrhage.    Pupils: Pupils are equal, round, and reactive to light.  Pulmonary:     Effort: Pulmonary effort is normal.  Neurological:     General: No focal deficit present.     Mental Status: She is alert and oriented to person, place, and time.     GCS: GCS eye subscore is 4. GCS verbal subscore is 5. GCS motor subscore is 6.     Cranial Nerves: No cranial nerve deficit, dysarthria or facial asymmetry.  Psychiatric:        Attention and Perception: Attention and perception normal.        Mood and Affect: Mood and affect normal.        Speech: Speech normal.        Behavior: Behavior normal. Behavior is cooperative.      UC Treatments / Results  Labs (all labs ordered are listed, but only abnormal results are displayed) Labs Reviewed - No data to display  EKG   Radiology No results found.  Procedures Procedures (including critical care time)  Medications Ordered in UC Medications - No data to display  Initial Impression / Assessment and Plan / UC Course  I have reviewed the triage vital signs and the nursing notes.  Pertinent labs & imaging results that were available during my care of the patient were reviewed by me and considered in my medical decision making (see chart for details).      Final Clinical Impressions(s) / UC Diagnoses   Final diagnoses:  Bruise of periocular tissue, right, initial encounter   Patient presents today with concerns for bruising along the right eye.  She reports that a dog that she was babysitting accidentally hit her in the face causing the bruising and swelling.  She denies obvious visual acuity changes, significant pain to the area.  She reports mild headache but no pain with EOM.  Physical exam is overall reassuring.   There is mild bruising along the lateral aspect of the right eye but EOMs are intact and there does not appear to be any damage to the eye itself.  Reviewed that bruising may continue for 1 to 2 weeks but she can do a few home measures to assist with recovery.  Recommend cool compresses for the next day or so followed by warm to help with blood flow.  Recommend Tylenol as needed for pain management.  ED and return precautions were reviewed and provided in after visit summary.  Follow-up as needed.    Discharge Instructions      You were seen today for bruising along the right eye.  At this time your exam is overall reassuring.  The bruising should start to resolve over the next week or so on its own.  You can use a cool compress to the area, 15 minutes on with at least 30 minutes off.  I recommend cool compress for the next day or so to help with swelling.  After this you can switch over to warm compresses on the area with the same time restrictions.  You can use Tylenol as needed for pain management.  If at any point you feel you are unable to move your eyes or there is intense pain with eye movement, vision changes, swelling to the area, intense headaches please go to the emergency room as these could be  signs of a medical emergency.     ED Prescriptions   None    PDMP not reviewed this encounter.   Providence Crosby, PA-C 04/03/24 1715

## 2024-04-03 NOTE — ED Triage Notes (Addendum)
 Pt presents with complaints of right eye swelling since Monday 4/7 after dog's nose clashed with patient's eye. Pt states she has little pain when blinking. When rubbing the area, it feels sore. Pt has been applying cold packs to area with minimal relief. Denies vision changes. Tylenol taken on Monday following incident.

## 2024-04-23 ENCOUNTER — Other Ambulatory Visit: Payer: Self-pay

## 2024-04-23 ENCOUNTER — Ambulatory Visit

## 2024-04-23 DIAGNOSIS — R29898 Other symptoms and signs involving the musculoskeletal system: Secondary | ICD-10-CM | POA: Diagnosis not present

## 2024-04-23 DIAGNOSIS — R2689 Other abnormalities of gait and mobility: Secondary | ICD-10-CM | POA: Diagnosis not present

## 2024-04-23 DIAGNOSIS — R262 Difficulty in walking, not elsewhere classified: Secondary | ICD-10-CM | POA: Diagnosis not present

## 2024-04-23 NOTE — Therapy (Signed)
 OUTPATIENT PHYSICAL THERAPY LOWER EXTREMITY EVALUATION   Patient Name: Tina Ellis MRN: [id number] DOB:1941-07-19, 83 y.o., female Today's Date: 04/23/2024  END OF SESSION:  PT End of Session - 04/23/24 1710     Visit Number 1    Date for PT Re-Evaluation 07/16/24    Progress Note Due on Visit 10    PT Start Time 1600    PT Stop Time 1643    PT Time Calculation (min) 43 min    Activity Tolerance Patient tolerated treatment well    Behavior During Therapy [facility] for tasks assessed/performed             Past Medical History:  Diagnosis Date   Family history of breast cancer    Family history of colon cancer    Family history of lung cancer    GERD (gastroesophageal reflux disease)    Hypercholesteremia    Hypertension    left breast ca 06/2021   Port-A-Cath in place 07/21/2021   Seasonal allergies    TIA (transient ischemic attack)    Past Surgical History:  Procedure Laterality Date   BACK SURGERY     x 2   CHOLECYSTECTOMY     MASTECTOMY     PARTIAL HYSTERECTOMY     Patient Active Problem List   Diagnosis Date Noted   Genetic testing 09/06/2021   Family history of colon cancer 07/18/2021   Family history of breast cancer 07/18/2021   Family history of lung cancer 07/18/2021   Malignant neoplasm of upper-outer quadrant of left breast in female, estrogen receptor negative (HCC) 07/12/2021   Elevated troponin 04/09/2017   TIA (transient ischemic attack) 04/08/2017   Abdominal pain    Stroke-like symptom 04/06/2017   HTN (hypertension) 04/06/2017   GERD (gastroesophageal reflux disease) 04/06/2017   Influenza A 12/15/2013   Hypotension 12/14/2013   Acute renal failure (HCC) 12/14/2013   Lower urinary tract infectious disease 12/13/2013   Hypotension, unspecified 12/13/2013   Dehydration 12/13/2013    PCP: Felipe Horton, MD  REFERRING PROVIDER: Yvonnie Heritage NP  REFERRING DIAG: B LE weakness  THERAPY DIAG:  Difficulty in walking, not elsewhere  classified  Balance problem  Leg weakness, bilateral  Rationale for Evaluation and Treatment: Rehabilitation  ONSET DATE: 3 months  SUBJECTIVE:   SUBJECTIVE STATEMENT: 3 months of general decline in balance and particularly walking, gets tires, has trouble getting up from a seated position  PERTINENT HISTORY: Referred by PCP due to new use of cane and recent onset increased  leg weakness PAIN:  Are you having pain? No, just some mild back pain at times  PRECAUTIONS: Fall  RED FLAGS: None   WEIGHT BEARING RESTRICTIONS: No  FALLS:  Has patient fallen in last 6 months? No  LIVING ENVIRONMENT: Lives with: lives with their family Lives in: House/apartment Stairs: Yes: External: 2 steps; on right going up Has following equipment at home: Single point cane  OCCUPATION: retired  PLOF: Independent  PATIENT GOALS: to get more stable, avoid falling, be able to get up easier out of chair  NEXT MD VISIT: end of May  OBJECTIVE:  Note: Objective measures were completed at Evaluation unless otherwise noted.  DIAGNOSTIC FINDINGS: na   COGNITION: Overall cognitive status: Within functional limits for tasks assessed     SENSATION: WFL  EDEMA: B ankles mod edema, no discoloration noted      POSTURE:  mild flexion B hips and knees in standing  PALPATION: na  LOWER EXTREMITY [id number] wfl  LOWER EXTREMITY MMT:  MMT Right eval Left eval  Hip flexion 4- 4-  Hip extension    Hip abduction 3+ 3+  Hip adduction    Hip internal rotation    Hip external rotation    Knee flexion    Knee extension 3+ 3+  Ankle dorsiflexion 3+ 3+  Ankle plantarflexion 4 4  Ankle inversion    Ankle eversion     (Blank rows = not tested)   FUNCTIONAL TESTS:  30 seconds chair stand test 10 Timed up and go (TUG): 13.38  FGA 20/30 GAIT: Distance walked: in clinic up to 50' Assistive device utilized: Single point cane and Grab bars Level of assistance: Modified  independence Comments: some increased deviation outside of path                                                                                                                                TREATMENT DATE: 04/23/24 evaluation    PATIENT EDUCATION:  Education details: POC, goals Person educated: Patient Education method: Explanation, Demonstration, Tactile cues, and Verbal cues Education comprehension: verbalized understanding  HOME EXERCISE PROGRAM: TBD  ASSESSMENT:  CLINICAL IMPRESSION: Patient is an 83 y.o. female who was evaluate today by physical therapy  for recent onset of lower extremity weakness.  Noted B quads weakness primarily, actually scored well with TUG and 30 sec sit to stand, but score on FGA indicates high fall risk.  Noted loss of balance especially with horizontal head turns.  She should benefit from skilled PT to address her deficits, she does not participate in any formal exercise routine, so initially she should improve her strength and endurance, balance with structured cardiovascular training and B LE strengthening.  She will be obtaining lab work in a few weeks and then follow up with her MD to determine whether there are any other underlying conditions contributing to her recent onset of weakness.   OBJECTIVE IMPAIRMENTS: decreased activity tolerance, decreased balance, decreased endurance, difficulty walking, and decreased strength.   ACTIVITY LIMITATIONS: carrying, lifting, standing, squatting, stairs, and transfers  PARTICIPATION LIMITATIONS: cleaning, laundry, shopping, and community activity  PERSONAL FACTORS: Age, Behavior pattern, Fitness, Past/current experiences, Time since onset of injury/illness/exacerbation, and 1-2 comorbidities: h/o breast Ca, HTN, H/o TIA  are also affecting patient's functional outcome.   REHAB POTENTIAL: Good  CLINICAL DECISION MAKING: Evolving/moderate complexity  EVALUATION COMPLEXITY: Moderate   GOALS: Goals  reviewed with patient? Yes  SHORT TERM GOALS: Target date: 2 weeks ,05/07/24 I HEP Baseline: Goal status: INITIAL   LONG TERM GOALS: Target date: 12 weeks, 07/16/24  Improve FGA from 20/30 to 27/30 Baseline:  Goal status: INITIAL  2.  Improve MMT B quads to 5/5 for improved ease with sit to stand movement Baseline:  Goal status: INITIAL  3.  Improve 30 sec sit to stand from 10 reps to 13 reps  Baseline:  Goal status: INITIAL  4.  Complete 6 min walk  test and establish goal Baseline:  Goal status: INITIAL   PLAN:  PT FREQUENCY: 2x/week  PT DURATION: 12 weeks  PLANNED INTERVENTIONS: 97110-Therapeutic exercises, 97530- Therapeutic activity, [id number]- Neuromuscular re-education, 97535- Self Care, and 11914- Manual therapy  PLAN FOR NEXT SESSION: introduce to cardiovascular machines, LE strengthening , balance challenges   Adoria Kawamoto L Airlie Blumenberg, PT, DPT, OCS 04/23/2024, 5:11 PM

## 2024-04-29 ENCOUNTER — Ambulatory Visit

## 2024-04-29 DIAGNOSIS — R2689 Other abnormalities of gait and mobility: Secondary | ICD-10-CM | POA: Diagnosis not present

## 2024-04-29 DIAGNOSIS — R29898 Other symptoms and signs involving the musculoskeletal system: Secondary | ICD-10-CM | POA: Insufficient documentation

## 2024-04-29 DIAGNOSIS — R262 Difficulty in walking, not elsewhere classified: Secondary | ICD-10-CM | POA: Diagnosis not present

## 2024-04-29 NOTE — Therapy (Signed)
 OUTPATIENT PHYSICAL THERAPY LOWER EXTREMITY TREATMENT   Patient Name: Tina Ellis MRN: [id number] DOB:1941/04/02, 83 y.o., female Today's Date: 04/29/2024  END OF SESSION:  PT End of Session - 04/29/24 1745     Visit Number 2    Date for PT Re-Evaluation 07/16/24    Progress Note Due on Visit 10    PT Start Time 1745    PT Stop Time 1830    PT Time Calculation (min) 45 min    Activity Tolerance Patient tolerated treatment well    Behavior During Therapy [facility] for tasks assessed/performed              Past Medical History:  Diagnosis Date   Family history of breast cancer    Family history of colon cancer    Family history of lung cancer    GERD (gastroesophageal reflux disease)    Hypercholesteremia    Hypertension    left breast ca 06/2021   Port-A-Cath in place 07/21/2021   Seasonal allergies    TIA (transient ischemic attack)    Past Surgical History:  Procedure Laterality Date   BACK SURGERY     x 2   CHOLECYSTECTOMY     MASTECTOMY     PARTIAL HYSTERECTOMY     Patient Active Problem List   Diagnosis Date Noted   Genetic testing 09/06/2021   Family history of colon cancer 07/18/2021   Family history of breast cancer 07/18/2021   Family history of lung cancer 07/18/2021   Malignant neoplasm of upper-outer quadrant of left breast in female, estrogen receptor negative (HCC) 07/12/2021   Elevated troponin 04/09/2017   TIA (transient ischemic attack) 04/08/2017   Abdominal pain    Stroke-like symptom 04/06/2017   HTN (hypertension) 04/06/2017   GERD (gastroesophageal reflux disease) 04/06/2017   Influenza A 12/15/2013   Hypotension 12/14/2013   Acute renal failure (HCC) 12/14/2013   Lower urinary tract infectious disease 12/13/2013   Hypotension, unspecified 12/13/2013   Dehydration 12/13/2013    PCP: Felipe Horton, MD  REFERRING PROVIDER: Yvonnie Heritage NP  REFERRING DIAG: B LE weakness  THERAPY DIAG:  Difficulty in walking, not elsewhere  classified  Balance problem  Leg weakness, bilateral  Rationale for Evaluation and Treatment: Rehabilitation  ONSET DATE: 3 months  SUBJECTIVE:   SUBJECTIVE STATEMENT: Just tired, legs feel like rubber bands.   PERTINENT HISTORY: Referred by PCP due to new use of cane and recent onset increased  leg weakness  Hx of breast cancer and surgery   PAIN:  Are you having pain? No, just some mild back pain at times  PRECAUTIONS: Fall  RED FLAGS: None   WEIGHT BEARING RESTRICTIONS: No  FALLS:  Has patient fallen in last 6 months? No  LIVING ENVIRONMENT: Lives with: lives with their family Lives in: House/apartment Stairs: Yes: External: 2 steps; on right going up Has following equipment at home: Single point cane  OCCUPATION: retired  PLOF: Independent  PATIENT GOALS: to get more stable, avoid falling, be able to get up easier out of chair  NEXT MD VISIT: end of May  OBJECTIVE:  Note: Objective measures were completed at Evaluation unless otherwise noted.  DIAGNOSTIC FINDINGS: na   COGNITION: Overall cognitive status: Within functional limits for tasks assessed     SENSATION: WFL  EDEMA: B ankles mod edema, no discoloration noted      POSTURE:  mild flexion B hips and knees in standing  PALPATION: na  LOWER EXTREMITY [id number] wfl   LOWER  EXTREMITY MMT:  MMT Right eval Left eval  Hip flexion 4- 4-  Hip extension    Hip abduction 3+ 3+  Hip adduction    Hip internal rotation    Hip external rotation    Knee flexion    Knee extension 3+ 3+  Ankle dorsiflexion 3+ 3+  Ankle plantarflexion 4 4  Ankle inversion    Ankle eversion     (Blank rows = not tested)   FUNCTIONAL TESTS:  30 seconds chair stand test 10 Timed up and go (TUG): 13.38  FGA 20/30  GAIT: Distance walked: in clinic up to 50' Assistive device utilized: Single point cane and Grab bars Level of assistance: Modified independence Comments: some increased deviation  outside of path                                                                                                                                TREATMENT DATE:  04/29/24 6 min walk test- 773ft with one rest stop due to dizziness  LAQ 2# 2x10 HS curls red 2x10 STS 2x10 Box taps 4"  Step ups 4"  NuStep L4 x60mins   04/23/24 evaluation    PATIENT EDUCATION:  Education details: POC, goals Person educated: Patient Education method: Explanation, Demonstration, Tactile cues, and Verbal cues Education comprehension: verbalized understanding  HOME EXERCISE PROGRAM: TBD  ASSESSMENT:  CLINICAL IMPRESSION: Patient continues to have c/o weakness and decreased balance in BLE. With 6 min walk test, she ambulated with a cane. She needed 1 seated rest break due to getting lightheaded and had a slight LOB. We started some light strengthening for her legs today. Cues needed with STS to lean forward instead of shifting weight onto heels or using back of legs against table. Was warned about possible soreness in the days to follow. Will continue to work on balance and strength.    Patient is an 83 y.o. female who was evaluate today by physical therapy  for recent onset of lower extremity weakness.  Noted B quads weakness primarily, actually scored well with TUG and 30 sec sit to stand, but score on FGA indicates high fall risk.  Noted loss of balance especially with horizontal head turns.  She should benefit from skilled PT to address her deficits, she does not participate in any formal exercise routine, so initially she should improve her strength and endurance, balance with structured cardiovascular training and B LE strengthening.  She will be obtaining lab work in a few weeks and then follow up with her MD to determine whether there are any other underlying conditions contributing to her recent onset of weakness.   OBJECTIVE IMPAIRMENTS: decreased activity tolerance, decreased balance, decreased  endurance, difficulty walking, and decreased strength.   ACTIVITY LIMITATIONS: carrying, lifting, standing, squatting, stairs, and transfers  PARTICIPATION LIMITATIONS: cleaning, laundry, shopping, and community activity  PERSONAL FACTORS: Age, Behavior pattern, Fitness, Past/current experiences, Time since onset of injury/illness/exacerbation, and 1-2 comorbidities:  h/o breast Ca, HTN, H/o TIA  are also affecting patient's functional outcome.   REHAB POTENTIAL: Good  CLINICAL DECISION MAKING: Evolving/moderate complexity  EVALUATION COMPLEXITY: Moderate   GOALS: Goals reviewed with patient? Yes  SHORT TERM GOALS: Target date: 2 weeks, 05/07/24 I HEP Baseline: Goal status: INITIAL   LONG TERM GOALS: Target date: 12 weeks, 07/16/24  Improve FGA from 20/30 to 27/30 Baseline:  Goal status: INITIAL  2.  Improve MMT B quads to 5/5 for improved ease with sit to stand movement Baseline:  Goal status: INITIAL  3.  Improve 30 sec sit to stand from 10 reps to 13 reps  Baseline:  Goal status: INITIAL  4.  Will complete 815ft in 6 min walk test no rest breaks Baseline: 754ft with 1 rest break, got lightheaded and needed to sit Goal status: INITIAL   PLAN:  PT FREQUENCY: 2x/week  PT DURATION: 12 weeks  PLANNED INTERVENTIONS: 97110-Therapeutic exercises, 97530- Therapeutic activity, 97112- Neuromuscular re-education, 97535- Self Care, and 16109- Manual therapy  PLAN FOR NEXT SESSION: introduce to cardiovascular machines, LE strengthening , balance challenges   Donavon Fudge, PT, DPT 04/29/2024, 6:31 PM

## 2024-05-05 ENCOUNTER — Ambulatory Visit: Admitting: Physical Therapy

## 2024-05-05 ENCOUNTER — Encounter: Payer: Self-pay | Admitting: Physical Therapy

## 2024-05-05 DIAGNOSIS — R262 Difficulty in walking, not elsewhere classified: Secondary | ICD-10-CM | POA: Diagnosis not present

## 2024-05-05 DIAGNOSIS — R2689 Other abnormalities of gait and mobility: Secondary | ICD-10-CM | POA: Diagnosis not present

## 2024-05-05 DIAGNOSIS — R29898 Other symptoms and signs involving the musculoskeletal system: Secondary | ICD-10-CM | POA: Diagnosis not present

## 2024-05-05 NOTE — Therapy (Signed)
 OUTPATIENT PHYSICAL THERAPY LOWER EXTREMITY TREATMENT   Patient Name: Tina Ellis MRN: [id number] DOB:03-20-1941, 83 y.o., female Today's Date: 05/05/2024  END OF SESSION:  PT End of Session - 05/05/24 1111     Visit Number 3    Date for PT Re-Evaluation 07/16/24    Authorization Type MCR    Progress Note Due on Visit 10    PT Start Time 1100    PT Stop Time 1139    PT Time Calculation (min) 39 min    Activity Tolerance Patient tolerated treatment well    Behavior During Therapy [facility] for tasks assessed/performed               Past Medical History:  Diagnosis Date   Family history of breast cancer    Family history of colon cancer    Family history of lung cancer    GERD (gastroesophageal reflux disease)    Hypercholesteremia    Hypertension    left breast ca 06/2021   Port-A-Cath in place 07/21/2021   Seasonal allergies    TIA (transient ischemic attack)    Past Surgical History:  Procedure Laterality Date   BACK SURGERY     x 2   CHOLECYSTECTOMY     MASTECTOMY     PARTIAL HYSTERECTOMY     Patient Active Problem List   Diagnosis Date Noted   Genetic testing 09/06/2021   Family history of colon cancer 07/18/2021   Family history of breast cancer 07/18/2021   Family history of lung cancer 07/18/2021   Malignant neoplasm of upper-outer quadrant of left breast in female, estrogen receptor negative (HCC) 07/12/2021   Elevated troponin 04/09/2017   TIA (transient ischemic attack) 04/08/2017   Abdominal pain    Stroke-like symptom 04/06/2017   HTN (hypertension) 04/06/2017   GERD (gastroesophageal reflux disease) 04/06/2017   Influenza A 12/15/2013   Hypotension 12/14/2013   Acute renal failure (HCC) 12/14/2013   Lower urinary tract infectious disease 12/13/2013   Hypotension, unspecified 12/13/2013   Dehydration 12/13/2013    PCP: Felipe Horton, MD  REFERRING PROVIDER: Yvonnie Heritage NP  REFERRING DIAG: B LE weakness  THERAPY DIAG:  Difficulty in  walking, not elsewhere classified  Balance problem  Leg weakness, bilateral  Rationale for Evaluation and Treatment: Rehabilitation  ONSET DATE: 3 months  SUBJECTIVE:   SUBJECTIVE STATEMENT:  Legs are still just feeling very weak, barely making it. Have a physical coming up at the end of the month, hope they can figure out why I feel so weak. Don't like not knowing why I feel like this.    PERTINENT HISTORY:    Hx of breast cancer and back surgery   PAIN:  Are you having pain? No 0/10 just sore from exercises   PRECAUTIONS: Fall  RED FLAGS: None   WEIGHT BEARING RESTRICTIONS: No  FALLS:  Has patient fallen in last 6 months? No  LIVING ENVIRONMENT: Lives with: lives with their family Lives in: House/apartment Stairs: Yes: External: 2 steps; on right going up Has following equipment at home: Single point cane  OCCUPATION: retired  PLOF: Independent  PATIENT GOALS: to get more stable, avoid falling, be able to get up easier out of chair  NEXT MD VISIT: end of May  OBJECTIVE:  Note: Objective measures were completed at Evaluation unless otherwise noted.  DIAGNOSTIC FINDINGS: na   COGNITION: Overall cognitive status: Within functional limits for tasks assessed     SENSATION: WFL  EDEMA: B ankles mod edema, no  discoloration noted      POSTURE: mild flexion B hips and knees in standing  PALPATION: na  LOWER EXTREMITY [id number] wfl   LOWER EXTREMITY MMT:  MMT Right eval Left eval  Hip flexion 4- 4-  Hip extension    Hip abduction 3+ 3+  Hip adduction    Hip internal rotation    Hip external rotation    Knee flexion    Knee extension 3+ 3+  Ankle dorsiflexion 3+ 3+  Ankle plantarflexion 4 4  Ankle inversion    Ankle eversion     (Blank rows = not tested)   FUNCTIONAL TESTS:  30 seconds chair stand test 10 Timed up and go (TUG): 13.38  FGA 20/30  GAIT: Distance walked: in clinic up to 50' Assistive device utilized: Single  point cane and Grab bars Level of assistance: Modified independence Comments: some increased deviation outside of path                                                                                                                                TREATMENT DATE:   05/05/24  Nustep L4x8 minutes all four extremities seat 9 STS red TB around knees 2x10  Gait with 3# each LE for general conditioning 2x172ft Tandem stance solid surface 3x30 seconds B  Bridges red TB 2x10 Sidelying clams red TB 1x10  LAQs 3# 2x10 B    04/29/24 6 min walk test- 735ft with one rest stop due to dizziness  LAQ 2# 2x10 HS curls red 2x10 STS 2x10 Box taps 4"  Step ups 4"  NuStep L4 x22mins   04/23/24 evaluation    PATIENT EDUCATION:  Education details: POC, goals Person educated: Patient Education method: Programmer, multimedia, Demonstration, Tactile cues, and Verbal cues Education comprehension: verbalized understanding  HOME EXERCISE PROGRAM: TBD  ASSESSMENT:  CLINICAL IMPRESSION:  Still feeling very weak, we continued working on functional strengthening and conditioning primarily per her request. Encouraged her that strength and endurance gains will take some time before significant gains are noted functionally. Will continue to assess and progress.     EVAL: Patient is an 83 y.o. female who was evaluate today by physical therapy  for recent onset of lower extremity weakness.  Noted B quads weakness primarily, actually scored well with TUG and 30 sec sit to stand, but score on FGA indicates high fall risk.  Noted loss of balance especially with horizontal head turns.  She should benefit from skilled PT to address her deficits, she does not participate in any formal exercise routine, so initially she should improve her strength and endurance, balance with structured cardiovascular training and B LE strengthening.  She will be obtaining lab work in a few weeks and then follow up with her MD to determine  whether there are any other underlying conditions contributing to her recent onset of weakness.   OBJECTIVE IMPAIRMENTS: decreased activity tolerance, decreased balance, decreased endurance, difficulty walking, and decreased  strength.   ACTIVITY LIMITATIONS: carrying, lifting, standing, squatting, stairs, and transfers  PARTICIPATION LIMITATIONS: cleaning, laundry, shopping, and community activity  PERSONAL FACTORS: Age, Behavior pattern, Fitness, Past/current experiences, Time since onset of injury/illness/exacerbation, and 1-2 comorbidities: h/o breast Ca, HTN, H/o TIA are also affecting patient's functional outcome.   REHAB POTENTIAL: Good  CLINICAL DECISION MAKING: Evolving/moderate complexity  EVALUATION COMPLEXITY: Moderate   GOALS: Goals reviewed with patient? Yes  SHORT TERM GOALS: Target date: 2 weeks, 05/07/24 I HEP Baseline: Goal status: INITIAL   LONG TERM GOALS: Target date: 12 weeks, 07/16/24  Improve FGA from 20/30 to 27/30 Baseline:  Goal status: INITIAL  2.  Improve MMT B quads to 5/5 for improved ease with sit to stand movement Baseline:  Goal status: INITIAL  3.  Improve 30 sec sit to stand from 10 reps to 13 reps  Baseline:  Goal status: INITIAL  4.  Will complete 842ft in 6 min walk test no rest breaks Baseline: 797ft with 1 rest break, got lightheaded and needed to sit Goal status: INITIAL   PLAN:  PT FREQUENCY: 2x/week  PT DURATION: 12 weeks  PLANNED INTERVENTIONS: 97110-Therapeutic exercises, 97530- Therapeutic activity, 97112- Neuromuscular re-education, 97535- Self Care, and 16109- Manual therapy  PLAN FOR NEXT SESSION: progress cardiovascular machines, LE strengthening , balance challenges  Terrel Ferries, PT, DPT 05/05/24 11:40 AM

## 2024-05-07 ENCOUNTER — Ambulatory Visit: Admitting: Physical Therapy

## 2024-05-13 ENCOUNTER — Encounter: Payer: Self-pay | Admitting: Physical Therapy

## 2024-05-13 ENCOUNTER — Ambulatory Visit: Admitting: Physical Therapy

## 2024-05-13 DIAGNOSIS — E782 Mixed hyperlipidemia: Secondary | ICD-10-CM | POA: Diagnosis not present

## 2024-05-13 DIAGNOSIS — R29898 Other symptoms and signs involving the musculoskeletal system: Secondary | ICD-10-CM

## 2024-05-13 DIAGNOSIS — I10 Essential (primary) hypertension: Secondary | ICD-10-CM | POA: Diagnosis not present

## 2024-05-13 DIAGNOSIS — R262 Difficulty in walking, not elsewhere classified: Secondary | ICD-10-CM

## 2024-05-13 DIAGNOSIS — Z0000 Encounter for general adult medical examination without abnormal findings: Secondary | ICD-10-CM | POA: Diagnosis not present

## 2024-05-13 DIAGNOSIS — E7800 Pure hypercholesterolemia, unspecified: Secondary | ICD-10-CM | POA: Diagnosis not present

## 2024-05-13 DIAGNOSIS — R2689 Other abnormalities of gait and mobility: Secondary | ICD-10-CM | POA: Diagnosis not present

## 2024-05-13 DIAGNOSIS — E559 Vitamin D deficiency, unspecified: Secondary | ICD-10-CM | POA: Diagnosis not present

## 2024-05-13 NOTE — Therapy (Signed)
 OUTPATIENT PHYSICAL THERAPY LOWER EXTREMITY TREATMENT   Patient Name: Tina Ellis MRN: [id number] DOB:[birth year], 83 y.o., female Today's Date: 05/13/2024  END OF SESSION:  PT End of Session - 05/13/24 1427     Visit Number 4    Date for PT Re-Evaluation 07/16/24    PT Start Time 1430    PT Stop Time 1515    PT Time Calculation (min) 45 min    Activity Tolerance Patient tolerated treatment well    Behavior During Therapy [facility] for tasks assessed/performed               Past Medical History:  Diagnosis Date   Family history of breast cancer    Family history of colon cancer    Family history of lung cancer    GERD (gastroesophageal reflux disease)    Hypercholesteremia    Hypertension    left breast ca 06/2021   Port-A-Cath in place 07/21/2021   Seasonal allergies    TIA (transient ischemic attack)    Past Surgical History:  Procedure Laterality Date   BACK SURGERY     x 2   CHOLECYSTECTOMY     MASTECTOMY     PARTIAL HYSTERECTOMY     Patient Active Problem List   Diagnosis Date Noted   Genetic testing 09/06/2021   Family history of colon cancer 07/18/2021   Family history of breast cancer 07/18/2021   Family history of lung cancer 07/18/2021   Malignant neoplasm of upper-outer quadrant of left breast in female, estrogen receptor negative (HCC) 07/12/2021   Elevated troponin 04/09/2017   TIA (transient ischemic attack) 04/08/2017   Abdominal pain    Stroke-like symptom 04/06/2017   HTN (hypertension) 04/06/2017   GERD (gastroesophageal reflux disease) 04/06/2017   Influenza A 12/15/2013   Hypotension 12/14/2013   Acute renal failure (HCC) 12/14/2013   Lower urinary tract infectious disease 12/13/2013   Hypotension, unspecified 12/13/2013   Dehydration 12/13/2013    PCP: Felipe Horton, MD  REFERRING PROVIDER: Yvonnie Heritage NP  REFERRING DIAG: B LE weakness  THERAPY DIAG:  Difficulty in walking, not elsewhere classified  Balance problem  Leg  weakness, bilateral  Rationale for Evaluation and Treatment: Rehabilitation  ONSET DATE: 3 months  SUBJECTIVE:   SUBJECTIVE STATEMENT:  "Can you be easy on me today" Pt reports some back pain (Two back surgeries in the past), legs are really weak and trebly   PERTINENT HISTORY:    Hx of breast cancer and back surgery   PAIN:  Are you having pain? No 5/10 Back  PRECAUTIONS: Fall  RED FLAGS: None   WEIGHT BEARING RESTRICTIONS: No  FALLS:  Has patient fallen in last 6 months? No  LIVING ENVIRONMENT: Lives with: lives with their family Lives in: House/apartment Stairs: Yes: External: 2 steps; on right going up Has following equipment at home: Single point cane  OCCUPATION: retired  PLOF: Independent  PATIENT GOALS: to get more stable, avoid falling, be able to get up easier out of chair  NEXT MD VISIT: end of May  OBJECTIVE:  Note: Objective measures were completed at Evaluation unless otherwise noted.  DIAGNOSTIC FINDINGS: na   COGNITION: Overall cognitive status: Within functional limits for tasks assessed     SENSATION: WFL  EDEMA: B ankles mod edema, no discoloration noted      POSTURE: mild flexion B hips and knees in standing  PALPATION: na  LOWER EXTREMITY [id number] wfl   LOWER EXTREMITY MMT:  MMT Right eval Left eval  Hip flexion 4- 4-  Hip extension    Hip abduction 3+ 3+  Hip adduction    Hip internal rotation    Hip external rotation    Knee flexion    Knee extension 3+ 3+  Ankle dorsiflexion 3+ 3+  Ankle plantarflexion 4 4  Ankle inversion    Ankle eversion     (Blank rows = not tested)   FUNCTIONAL TESTS:  30 seconds chair stand test 10 Timed up and go (TUG): 13.38  FGA 20/30  GAIT: Distance walked: in clinic up to 50' Assistive device utilized: Single point cane and Grab bars Level of assistance: Modified independence Comments: some increased deviation outside of path                                                                                                                                 TREATMENT DATE:  05/13/24 NuStep L 5 x 6 min Sit to stand from elevated surface 2x5 LAQ 1lb 2x10  Seated March 1lb 2x10 total HS curls red 2x10 Gait 1 lap no AD  05/05/24  Nustep L4x8 minutes all four extremities seat 9 STS red TB around knees 2x10  Gait with 3# each LE for general conditioning 2x163ft Tandem stance solid surface 3x30 seconds B  Bridges red TB 2x10 Sidelying clams red TB 1x10  LAQs 3# 2x10 B   04/29/24 6 min walk test- 789ft with one rest stop due to dizziness  LAQ 2# 2x10 HS curls red 2x10 STS 2x10 Box taps 4"  Step ups 4"  NuStep L4 x31mins   04/23/24 evaluation    PATIENT EDUCATION:  Education details: POC, goals Person educated: Patient Education method: Explanation, Demonstration, Tactile cues, and Verbal cues Education comprehension: verbalized understanding  HOME EXERCISE PROGRAM: TBD  ASSESSMENT:  CLINICAL IMPRESSION: Shorten session at less intensity due to pt's request. Still feeling very weak, we continued working on functional strengthening and conditioning. Pt did report less back pain post session. Again informed her that strength and endurance gains will take some time before significant gains are noted functionally. Will continue to assess and progress.     EVAL: Patient is an 83 y.o. female who was evaluate today by physical therapy  for recent onset of lower extremity weakness.  Noted B quads weakness primarily, actually scored well with TUG and 30 sec sit to stand, but score on FGA indicates high fall risk.  Noted loss of balance especially with horizontal head turns.  She should benefit from skilled PT to address her deficits, she does not participate in any formal exercise routine, so initially she should improve her strength and endurance, balance with structured cardiovascular training and B LE strengthening.  She will be obtaining lab work in  a few weeks and then follow up with her MD to determine whether there are any other underlying conditions contributing to her recent onset of weakness.   OBJECTIVE IMPAIRMENTS: decreased activity tolerance,  decreased balance, decreased endurance, difficulty walking, and decreased strength.   ACTIVITY LIMITATIONS: carrying, lifting, standing, squatting, stairs, and transfers  PARTICIPATION LIMITATIONS: cleaning, laundry, shopping, and community activity  PERSONAL FACTORS: Age, Behavior pattern, Fitness, Past/current experiences, Time since onset of injury/illness/exacerbation, and 1-2 comorbidities: h/o breast Ca, HTN, H/o TIA are also affecting patient's functional outcome.   REHAB POTENTIAL: Good  CLINICAL DECISION MAKING: Evolving/moderate complexity  EVALUATION COMPLEXITY: Moderate   GOALS: Goals reviewed with patient? Yes  SHORT TERM GOALS: Target date: 2 weeks, 05/07/24 I HEP Baseline: Goal status: INITIAL   LONG TERM GOALS: Target date: 12 weeks, 07/16/24  Improve FGA from 20/30 to 27/30 Baseline:  Goal status: INITIAL  2.  Improve MMT B quads to 5/5 for improved ease with sit to stand movement Baseline:  Goal status: INITIAL  3.  Improve 30 sec sit to stand from 10 reps to 13 reps  Baseline:  Goal status: INITIAL  4.  Will complete 896ft in 6 min walk test no rest breaks Baseline: 722ft with 1 rest break, got lightheaded and needed to sit Goal status: INITIAL   PLAN:  PT FREQUENCY: 2x/week  PT DURATION: 12 weeks  PLANNED INTERVENTIONS: 97110-Therapeutic exercises, 97530- Therapeutic activity, 97112- Neuromuscular re-education, 97535- Self Care, and 91478- Manual therapy  PLAN FOR NEXT SESSION: progress cardiovascular machines, LE strengthening , balance challenges  Terrel Ferries, PT, DPT 05/13/24 2:28 PM

## 2024-05-20 ENCOUNTER — Other Ambulatory Visit: Payer: Self-pay | Admitting: Registered Nurse

## 2024-05-20 DIAGNOSIS — Z853 Personal history of malignant neoplasm of breast: Secondary | ICD-10-CM | POA: Diagnosis not present

## 2024-05-20 DIAGNOSIS — I6529 Occlusion and stenosis of unspecified carotid artery: Secondary | ICD-10-CM

## 2024-05-20 DIAGNOSIS — R3129 Other microscopic hematuria: Secondary | ICD-10-CM | POA: Diagnosis not present

## 2024-05-20 DIAGNOSIS — Z0000 Encounter for general adult medical examination without abnormal findings: Secondary | ICD-10-CM | POA: Diagnosis not present

## 2024-05-20 DIAGNOSIS — I700 Atherosclerosis of aorta: Secondary | ICD-10-CM | POA: Diagnosis not present

## 2024-05-20 DIAGNOSIS — E7800 Pure hypercholesterolemia, unspecified: Secondary | ICD-10-CM | POA: Diagnosis not present

## 2024-05-20 DIAGNOSIS — E559 Vitamin D deficiency, unspecified: Secondary | ICD-10-CM | POA: Diagnosis not present

## 2024-05-20 DIAGNOSIS — K219 Gastro-esophageal reflux disease without esophagitis: Secondary | ICD-10-CM | POA: Diagnosis not present

## 2024-05-20 DIAGNOSIS — I10 Essential (primary) hypertension: Secondary | ICD-10-CM | POA: Diagnosis not present

## 2024-05-21 ENCOUNTER — Ambulatory Visit

## 2024-05-21 ENCOUNTER — Other Ambulatory Visit: Payer: Self-pay

## 2024-05-21 DIAGNOSIS — R262 Difficulty in walking, not elsewhere classified: Secondary | ICD-10-CM | POA: Diagnosis not present

## 2024-05-21 DIAGNOSIS — R29898 Other symptoms and signs involving the musculoskeletal system: Secondary | ICD-10-CM | POA: Diagnosis not present

## 2024-05-21 DIAGNOSIS — R2689 Other abnormalities of gait and mobility: Secondary | ICD-10-CM

## 2024-05-21 NOTE — Therapy (Signed)
 OUTPATIENT PHYSICAL THERAPY LOWER EXTREMITY TREATMENT   Patient Name: Tina Ellis MRN: [id number] DOB:[birth year], 83 y.o., female Today's Date: 05/21/2024  END OF SESSION:  PT End of Session - 05/21/24 1523     Visit Number 5    Date for PT Re-Evaluation 07/16/24    Authorization Type MCR    Progress Note Due on Visit 10    PT Start Time 1519    PT Stop Time 1601    PT Time Calculation (min) 42 min    Activity Tolerance Patient tolerated treatment well    Behavior During Therapy [facility] for tasks assessed/performed                Past Medical History:  Diagnosis Date   Family history of breast cancer    Family history of colon cancer    Family history of lung cancer    GERD (gastroesophageal reflux disease)    Hypercholesteremia    Hypertension    left breast ca 06/2021   Port-A-Cath in place 07/21/2021   Seasonal allergies    TIA (transient ischemic attack)    Past Surgical History:  Procedure Laterality Date   BACK SURGERY     x 2   CHOLECYSTECTOMY     MASTECTOMY     PARTIAL HYSTERECTOMY     Patient Active Problem List   Diagnosis Date Noted   Genetic testing 09/06/2021   Family history of colon cancer 07/18/2021   Family history of breast cancer 07/18/2021   Family history of lung cancer 07/18/2021   Malignant neoplasm of upper-outer quadrant of left breast in female, estrogen receptor negative (HCC) 07/12/2021   Elevated troponin 04/09/2017   TIA (transient ischemic attack) 04/08/2017   Abdominal pain    Stroke-like symptom 04/06/2017   HTN (hypertension) 04/06/2017   GERD (gastroesophageal reflux disease) 04/06/2017   Influenza A 12/15/2013   Hypotension 12/14/2013   Acute renal failure (HCC) 12/14/2013   Lower urinary tract infectious disease 12/13/2013   Hypotension, unspecified 12/13/2013   Dehydration 12/13/2013    PCP: Felipe Horton, MD  REFERRING PROVIDER: Yvonnie Heritage NP  REFERRING DIAG: B LE weakness  THERAPY DIAG:  Difficulty  in walking, not elsewhere classified  Balance problem  Leg weakness, bilateral  Rationale for Evaluation and Treatment: Rehabilitation  ONSET DATE: 3 months  SUBJECTIVE:   SUBJECTIVE STATEMENT: trying to be careful of my back, afraid of over doing it.  Having ultrasound of carotids tomorrow .  Legs still feel weak.  " PERTINENT HISTORY:    Hx of breast cancer and back surgery   PAIN:  Are you having pain? No 5/10 Back  PRECAUTIONS: Fall  RED FLAGS: None   WEIGHT BEARING RESTRICTIONS: No  FALLS:  Has patient fallen in last 6 months? No  LIVING ENVIRONMENT: Lives with: lives with their family Lives in: House/apartment Stairs: Yes: External: 2 steps; on right going up Has following equipment at home: Single point cane  OCCUPATION: retired  PLOF: Independent  PATIENT GOALS: to get more stable, avoid falling, be able to get up easier out of chair  NEXT MD VISIT: end of May  OBJECTIVE:  Note: Objective measures were completed at Evaluation unless otherwise noted.  DIAGNOSTIC FINDINGS: na   COGNITION: Overall cognitive status: Within functional limits for tasks assessed     SENSATION: WFL  EDEMA: B ankles mod edema, no discoloration noted      POSTURE: mild flexion B hips and knees in standing  PALPATION: na  LOWER  EXTREMITY [id number] wfl   LOWER EXTREMITY MMT:  MMT Right eval Left eval  Hip flexion 4- 4-  Hip extension    Hip abduction 3+ 3+  Hip adduction    Hip internal rotation    Hip external rotation    Knee flexion    Knee extension 3+ 3+  Ankle dorsiflexion 3+ 3+  Ankle plantarflexion 4 4  Ankle inversion    Ankle eversion     (Blank rows = not tested)   FUNCTIONAL TESTS:  30 seconds chair stand test 10 Timed up and go (TUG): 13.38  FGA 20/30  GAIT: Distance walked: in clinic up to 50' Assistive device utilized: Single point cane and Grab bars Level of assistance: Modified independence Comments: some increased  deviation outside of path                                                                                                                                TREATMENT DATE:  05/21/24:  Nustep level 5 x 6 min Long arc quads 1 1/2 # 10 x 2 each  Seated hip abd/ER green t band 15 x 1 Seated red t band ankle pumps 12 x each leg  Sit to stand from elevated mat 5 x 2 sets Red t band hamstring curls 12 x 2  Standing in ll bars for semi tandem standing, 15 sec each foot    05/13/24 NuStep L 5 x 6 min Sit to stand from elevated surface 2x5 LAQ 1lb 2x10  Seated March 1lb 2x10 total HS curls red 2x10 Gait 1 lap no AD  05/05/24  Nustep L4x8 minutes all four extremities seat 9 STS red TB around knees 2x10  Gait with 3# each LE for general conditioning 2x166ft Tandem stance solid surface 3x30 seconds B  Bridges red TB 2x10 Sidelying clams red TB 1x10  LAQs 3# 2x10 B   04/29/24 6 min walk test- 734ft with one rest stop due to dizziness  LAQ 2# 2x10 HS curls red 2x10 STS 2x10 Box taps 4"  Step ups 4"  NuStep L4 x60mins   04/23/24 evaluation    PATIENT EDUCATION:  Education details: POC, goals Person educated: Patient Education method: Programmer, multimedia, Demonstration, Tactile cues, and Verbal cues Education comprehension: verbalized understanding  HOME EXERCISE PROGRAM: TBD  ASSESSMENT:  CLINICAL IMPRESSION: Patient concerned about the weakness in her legs, reports she has stopped some statin medication for a trial per her MD 's request to trial.  She is concerned about overdoing, continued with gentle progression today of therex.   She tolerated well.  Needed frequent intermittent rest breaks. She is to continue 1 x week to address her LE strength and activity tolerance.     EVAL: Patient is an 83 y.o. female who was evaluate today by physical therapy  for recent onset of lower extremity weakness.  Noted B quads weakness primarily, actually scored well with TUG and 30 sec sit to  stand, but score on FGA indicates high fall risk.  Noted loss of balance especially with horizontal head turns.  She should benefit from skilled PT to address her deficits, she does not participate in any formal exercise routine, so initially she should improve her strength and endurance, balance with structured cardiovascular training and B LE strengthening.  She will be obtaining lab work in a few weeks and then follow up with her MD to determine whether there are any other underlying conditions contributing to her recent onset of weakness.   OBJECTIVE IMPAIRMENTS: decreased activity tolerance, decreased balance, decreased endurance, difficulty walking, and decreased strength.   ACTIVITY LIMITATIONS: carrying, lifting, standing, squatting, stairs, and transfers  PARTICIPATION LIMITATIONS: cleaning, laundry, shopping, and community activity  PERSONAL FACTORS: Age, Behavior pattern, Fitness, Past/current experiences, Time since onset of injury/illness/exacerbation, and 1-2 comorbidities: h/o breast Ca, HTN, H/o TIA are also affecting patient's functional outcome.   REHAB POTENTIAL: Good  CLINICAL DECISION MAKING: Evolving/moderate complexity  EVALUATION COMPLEXITY: Moderate   GOALS: Goals reviewed with patient? Yes  SHORT TERM GOALS: Target date: 2 weeks, 05/07/24 I HEP Baseline: Goal status: INITIAL   LONG TERM GOALS: Target date: 12 weeks, 07/16/24  Improve FGA from 20/30 to 27/30 Baseline:  Goal status: INITIAL  2.  Improve MMT B quads to 5/5 for improved ease with sit to stand movement Baseline:  Goal status: INITIAL  3.  Improve 30 sec sit to stand from 10 reps to 13 reps  Baseline:  Goal status: INITIAL  4.  Will complete 842ft in 6 min walk test no rest breaks Baseline: 764ft with 1 rest break, got lightheaded and needed to sit Goal status: INITIAL   PLAN:  PT FREQUENCY: 2x/week  PT DURATION: 12 weeks  PLANNED INTERVENTIONS: 97110-Therapeutic exercises, 97530-  Therapeutic activity, 97112- Neuromuscular re-education, 97535- Self Care, and 16109- Manual therapy  PLAN FOR NEXT SESSION: progress cardiovascular machines, LE strengthening , balance challenges  Curtistine Pettitt, PT, DPT, OCS 05/21/24 4:25 PM

## 2024-05-22 ENCOUNTER — Ambulatory Visit
Admission: RE | Admit: 2024-05-22 | Discharge: 2024-05-22 | Discharge status: Home / Self Care | Source: Ambulatory Visit | Attending: Registered Nurse | Admitting: Registered Nurse

## 2024-05-22 DIAGNOSIS — I6523 Occlusion and stenosis of bilateral carotid arteries: Secondary | ICD-10-CM | POA: Diagnosis not present

## 2024-05-22 DIAGNOSIS — I6529 Occlusion and stenosis of unspecified carotid artery: Secondary | ICD-10-CM

## 2024-05-27 ENCOUNTER — Encounter: Payer: Self-pay | Admitting: Registered Nurse

## 2024-05-27 ENCOUNTER — Ambulatory Visit: Admitting: Physical Therapy

## 2024-05-27 ENCOUNTER — Encounter: Payer: Self-pay | Admitting: Physical Therapy

## 2024-05-27 DIAGNOSIS — R262 Difficulty in walking, not elsewhere classified: Secondary | ICD-10-CM | POA: Insufficient documentation

## 2024-05-27 DIAGNOSIS — R29898 Other symptoms and signs involving the musculoskeletal system: Secondary | ICD-10-CM | POA: Insufficient documentation

## 2024-05-27 DIAGNOSIS — R2689 Other abnormalities of gait and mobility: Secondary | ICD-10-CM | POA: Insufficient documentation

## 2024-05-27 NOTE — Therapy (Signed)
 OUTPATIENT PHYSICAL THERAPY LOWER EXTREMITY TREATMENT   Patient Name: Tina Ellis MRN: [id number] DOB:1941/11/06, 83 y.o., female Today's Date: 05/27/2024  END OF SESSION:  PT End of Session - 05/27/24 1428     Visit Number 6    Date for PT Re-Evaluation 07/16/24    PT Start Time 1430    PT Stop Time 1515    PT Time Calculation (min) 45 min    Activity Tolerance Patient tolerated treatment well    Behavior During Therapy [facility] for tasks assessed/performed                Past Medical History:  Diagnosis Date   Family history of breast cancer    Family history of colon cancer    Family history of lung cancer    GERD (gastroesophageal reflux disease)    Hypercholesteremia    Hypertension    left breast ca 06/2021   Port-A-Cath in place 07/21/2021   Seasonal allergies    TIA (transient ischemic attack)    Past Surgical History:  Procedure Laterality Date   BACK SURGERY     x 2   CHOLECYSTECTOMY     MASTECTOMY     PARTIAL HYSTERECTOMY     Patient Active Problem List   Diagnosis Date Noted   Genetic testing 09/06/2021   Family history of colon cancer 07/18/2021   Family history of breast cancer 07/18/2021   Family history of lung cancer 07/18/2021   Malignant neoplasm of upper-outer quadrant of left breast in female, estrogen receptor negative (HCC) 07/12/2021   Elevated troponin 04/09/2017   TIA (transient ischemic attack) 04/08/2017   Abdominal pain    Stroke-like symptom 04/06/2017   HTN (hypertension) 04/06/2017   GERD (gastroesophageal reflux disease) 04/06/2017   Influenza A 12/15/2013   Hypotension 12/14/2013   Acute renal failure (HCC) 12/14/2013   Lower urinary tract infectious disease 12/13/2013   Hypotension, unspecified 12/13/2013   Dehydration 12/13/2013    PCP: Felipe Horton, MD  REFERRING PROVIDER: Yvonnie Heritage NP  REFERRING DIAG: B LE weakness  THERAPY DIAG:  Difficulty in walking, not elsewhere classified  Balance problem  Leg  weakness, bilateral  Rationale for Evaluation and Treatment: Rehabilitation  ONSET DATE: 3 months  SUBJECTIVE:   SUBJECTIVE STATEMENT:  Bp is low 107/60, having a little pain in the low back and L arm   PERTINENT HISTORY:    Hx of breast cancer and back surgery   PAIN:  Are you having pain? No 5/10 Back  PRECAUTIONS: Fall  RED FLAGS: None   WEIGHT BEARING RESTRICTIONS: No  FALLS:  Has patient fallen in last 6 months? No  LIVING ENVIRONMENT: Lives with: lives with their family Lives in: House/apartment Stairs: Yes: External: 2 steps; on right going up Has following equipment at home: Single point cane  OCCUPATION: retired  PLOF: Independent  PATIENT GOALS: to get more stable, avoid falling, be able to get up easier out of chair  NEXT MD VISIT: end of May  OBJECTIVE:  Note: Objective measures were completed at Evaluation unless otherwise noted.  DIAGNOSTIC FINDINGS: na   COGNITION: Overall cognitive status: Within functional limits for tasks assessed     SENSATION: WFL  EDEMA: B ankles mod edema, no discoloration noted      POSTURE: mild flexion B hips and knees in standing  PALPATION: na  LOWER EXTREMITY [id number] wfl   LOWER EXTREMITY MMT:  MMT Right eval Left eval  Hip flexion 4- 4-  Hip extension  Hip abduction 3+ 3+  Hip adduction    Hip internal rotation    Hip external rotation    Knee flexion    Knee extension 3+ 3+  Ankle dorsiflexion 3+ 3+  Ankle plantarflexion 4 4  Ankle inversion    Ankle eversion     (Blank rows = not tested)   FUNCTIONAL TESTS:  30 seconds chair stand test 10 Timed up and go (TUG): 13.38  FGA 20/30  GAIT: Distance walked: in clinic up to 50' Assistive device utilized: Single point cane and Grab bars Level of assistance: Modified independence Comments: some increased deviation outside of path                                                                                                                                 TREATMENT DATE:  05/27/24 Nustep level 5 x 6 min Sit to stand 2x10 some UE assist  LAQ 2lb 2x10 Standing hip abd 2lb x10 each HS curls green 2x10 Seated Hip Abd green 2x10 Hip add ball squeeze 2x10  05/21/24:  Nustep level 5 x 6 min Long arc quads 1 1/2 # 10 x 2 each  Seated hip abd/ER green t band 15 x 1 Seated red t band ankle pumps 12 x each leg  Sit to stand from elevated mat 5 x 2 sets Red t band hamstring curls 12 x 2  Standing in ll bars for semi tandem standing, 15 sec each foot    05/13/24 NuStep L 5 x 6 min Sit to stand from elevated surface 2x5 LAQ 1lb 2x10  Seated March 1lb 2x10 total HS curls red 2x10 Gait 1 lap no AD  05/05/24  Nustep L4x8 minutes all four extremities seat 9 STS red TB around knees 2x10  Gait with 3# each LE for general conditioning 2x139ft Tandem stance solid surface 3x30 seconds B  Bridges red TB 2x10 Sidelying clams red TB 1x10  LAQs 3# 2x10 B   04/29/24 6 min walk test- 744ft with one rest stop due to dizziness  LAQ 2# 2x10 HS curls red 2x10 STS 2x10 Box taps 4"  Step ups 4"  NuStep L4 x82mins   04/23/24 evaluation    PATIENT EDUCATION:  Education details: POC, goals Person educated: Patient Education method: Explanation, Demonstration, Tactile cues, and Verbal cues Education comprehension: verbalized understanding  HOME EXERCISE PROGRAM: TBD  ASSESSMENT:  CLINICAL IMPRESSION: Patient concerned because her BP was on the lower end. Interventions kept to a minium. continued with gentle progression today of therex.   She tolerated well.  Needed frequent intermittent rest breaks due to increase fatigue. She is to continue 1 x week to address her LE strength and activity tolerance.     EVAL: Patient is an 83 y.o. female who was evaluate today by physical therapy  for recent onset of lower extremity weakness.  Noted B quads weakness primarily, actually scored well with TUG and 30 sec  sit to  stand, but score on FGA indicates high fall risk.  Noted loss of balance especially with horizontal head turns.  She should benefit from skilled PT to address her deficits, she does not participate in any formal exercise routine, so initially she should improve her strength and endurance, balance with structured cardiovascular training and B LE strengthening.  She will be obtaining lab work in a few weeks and then follow up with her MD to determine whether there are any other underlying conditions contributing to her recent onset of weakness.   OBJECTIVE IMPAIRMENTS: decreased activity tolerance, decreased balance, decreased endurance, difficulty walking, and decreased strength.   ACTIVITY LIMITATIONS: carrying, lifting, standing, squatting, stairs, and transfers  PARTICIPATION LIMITATIONS: cleaning, laundry, shopping, and community activity  PERSONAL FACTORS: Age, Behavior pattern, Fitness, Past/current experiences, Time since onset of injury/illness/exacerbation, and 1-2 comorbidities: h/o breast Ca, HTN, H/o TIA are also affecting patient's functional outcome.   REHAB POTENTIAL: Good  CLINICAL DECISION MAKING: Evolving/moderate complexity  EVALUATION COMPLEXITY: Moderate   GOALS: Goals reviewed with patient? Yes  SHORT TERM GOALS: Target date: 2 weeks, 05/07/24 I HEP Baseline: Goal status: INITIAL   LONG TERM GOALS: Target date: 12 weeks, 07/16/24  Improve FGA from 20/30 to 27/30 Baseline:  Goal status: INITIAL  2.  Improve MMT B quads to 5/5 for improved ease with sit to stand movement Baseline:  Goal status: INITIAL  3.  Improve 30 sec sit to stand from 10 reps to 13 reps  Baseline:  Goal status: INITIAL  4.  Will complete 859ft in 6 min walk test no rest breaks Baseline: 744ft with 1 rest break, got lightheaded and needed to sit Goal status: INITIAL   PLAN:  PT FREQUENCY: 2x/week  PT DURATION: 12 weeks  PLANNED INTERVENTIONS: 97110-Therapeutic exercises, 97530-  Therapeutic activity, 97112- Neuromuscular re-education, 97535- Self Care, and 40981- Manual therapy  PLAN FOR NEXT SESSION: progress cardiovascular machines, LE strengthening , balance challenges  Towanda Fret, PTA 05/27/24 2:28 PM

## 2024-06-04 ENCOUNTER — Encounter: Payer: Self-pay | Admitting: Emergency Medicine

## 2024-06-04 ENCOUNTER — Ambulatory Visit: Admission: EM | Admit: 2024-06-04 | Discharge: 2024-06-04 | Disposition: A | Discharge status: Home / Self Care

## 2024-06-04 DIAGNOSIS — M7989 Other specified soft tissue disorders: Secondary | ICD-10-CM | POA: Diagnosis not present

## 2024-06-04 NOTE — ED Provider Notes (Signed)
 [facility]  Note:  This document was prepared using Dragon voice recognition software and may include unintentional dictation errors.  MRN: [id number] DOB: 12-09-1941  Subjective:   Tina Ellis is a 83 y.o. female presenting for bilateral ankle swelling x 1 week.  Patient has been wearing compression stockings but states by the end of the day cannot tell that she has been wearing any stockings because it and ankles are so swollen.  Patient denies any redness, warmth, pain with palpation of her lower leg or ankle.  Patient denies any known trauma or injury.  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  Patient also takes amlodipine  for blood pressure control and was told by her primary care physician this medication may cause some lower extremity swelling and edema.  No shortness of breath, chest pain, weakness, dizziness.  Patient admits that her feet sometimes feel very heavy due to the swelling does not believe that she has any profound weakness.  No current facility-administered medications for this encounter.  Current Outpatient Medications:    amLODipine  (NORVASC ) 2.5 MG tablet, Take 2.5 mg by mouth daily., Disp: , Rfl:    aspirin  EC 81 MG EC tablet, Take 1 tablet (81 mg total) by mouth daily., Disp: , Rfl:    carvedilol  (COREG ) 12.5 MG tablet, Take 1 tablet (12.5 mg total) by mouth 2 (two) times daily with a meal. (Patient taking differently: Take 3.125 mg by mouth 2 (two) times daily with a meal.), Disp: 60 tablet, Rfl: 0   cefdinir  (OMNICEF ) 300 MG capsule, Take 1 capsule (300 mg total) by mouth 2 (two) times daily., Disp: 14 capsule, Rfl: 0   cetirizine (ZYRTEC) 10 MG tablet, Take 10 mg by mouth at bedtime., Disp: , Rfl:    cholecalciferol (VITAMIN D ) 1000 UNITS tablet, Take 2,000 Units by mouth every morning., Disp: , Rfl:    Cholecalciferol 50 MCG (2000 UT) CAPS, 2,000 capsules., Disp: , Rfl:     fluticasone (FLONASE) 50 MCG/ACT nasal spray, Place 1 spray into both nostrils daily., Disp: , Rfl:    hydrochlorothiazide  (HYDRODIURIL ) 12.5 MG tablet, Take 12.5 mg by mouth daily. 1.5 only when it is high, Disp: , Rfl:    losartan  (COZAAR ) 100 MG tablet, Take 1 tablet by mouth daily., Disp: , Rfl:    metroNIDAZOLE  (FLAGYL ) 500 MG tablet, Take 1 tablet (500 mg total) by mouth 2 (two) times daily., Disp: 14 tablet, Rfl: 0   omeprazole (PRILOSEC) 20 MG capsule, Take 20 mg by mouth every morning. , Disp: , Rfl:    rosuvastatin  (CRESTOR ) 10 MG tablet, Take 1 tablet (10 mg total) by mouth every evening., Disp: 30 tablet, Rfl: 0   Allergies  Allergen Reactions   Conjugated Estrogens Other (See Comments)   Fish Oil Rash   Nitrofurantoin Nausea And Vomiting   Benadryl  Itch Stopping [Diphenhydramine  Hcl] Other (See Comments)    Tingling in hands and feet. Trouble speaking    Other     Other reaction(s): nausea   Pneumococcal 13-Val Conj Vacc Other (See Comments)   Pneumococcal 13-Val Conj Vacc     Other Reaction(s): URI, local swelling, redness    Past Medical History:  Diagnosis Date   Family history of breast cancer    Family history of colon cancer    Family history of lung cancer    GERD (gastroesophageal reflux disease)    Hypercholesteremia    Hypertension  left breast ca 06/2021   Port-A-Cath in place 07/21/2021   Seasonal allergies    TIA (transient ischemic attack)      Past Surgical History:  Procedure Laterality Date   BACK SURGERY     x 2   CHOLECYSTECTOMY     MASTECTOMY     PARTIAL HYSTERECTOMY      Family History  Problem Relation Age of Onset   Colon cancer Mother    Lung cancer Father    Crohn's disease Brother     Social History   Tobacco Use   Smoking status: Never   Smokeless tobacco: Never  Vaping Use   Vaping status: Never Used  Substance Use Topics   Alcohol use: No   Drug use: No    ROS Refer to HPI for ROS details.  Objective:     Vitals: BP 126/76 (BP Location: Right Arm)   Pulse 67   Temp 97.8 F (36.6 C) (Oral)   Resp 19   Ht 5' 6 (1.676 m)   Wt 134 lb (60.8 kg)   SpO2 98%   BMI 21.63 kg/m   Physical Exam Vitals and nursing note reviewed.  Constitutional:      General: She is not in acute distress.    Appearance: She is well-developed. She is not ill-appearing or toxic-appearing.  HENT:     Head: Normocephalic and atraumatic.  Cardiovascular:     Rate and Rhythm: Normal rate.  Pulmonary:     Effort: Pulmonary effort is normal. No respiratory distress.  Musculoskeletal:     Right ankle: Swelling present. No deformity. No tenderness. Normal range of motion. Normal pulse.     Left ankle: Swelling present. No deformity. No tenderness. Normal range of motion. Normal pulse.     Right foot: Normal range of motion and normal capillary refill. Swelling present. No deformity, tenderness or bony tenderness. Normal pulse.     Left foot: Normal range of motion and normal capillary refill. Swelling present. No deformity, tenderness or bony tenderness. Normal pulse.  Skin:    General: Skin is warm and dry.  Neurological:     General: No focal deficit present.     Mental Status: She is alert and oriented to person, place, and time.  Psychiatric:        Mood and Affect: Mood normal.        Behavior: Behavior normal.     Procedures  No results found for this or any previous visit (from the past 24 hours).  Assessment and Plan :     Discharge Instructions       1. Localized swelling of lower extremity (Primary) - Ambulatory referral to Vascular Surgery for further evaluation of acute lower extremity swelling and evaluation of varicose veins - Continue to monitor swelling and follow-up with primary care provider about the possibility of amlodipine  being secondary causes of lower extremity swelling. - Keep your legs elevated when resting to minimize swelling, Wear compression stockings while working  to decrease inflammation. -Continue to monitor symptoms for any change in severity if there is any escalation of current symptoms or development of new symptoms follow-up in ER for further evaluation and management.    Basel Defalco B Pakou Rainbow   Ermal Haberer, [place], [place] 06/04/24 1519

## 2024-06-04 NOTE — Discharge Instructions (Signed)
  1. Localized swelling of lower extremity (Primary) - Ambulatory referral to Vascular Surgery for further evaluation of acute lower extremity swelling and evaluation of varicose veins - Continue to monitor swelling and follow-up with primary care provider about the possibility of amlodipine  being secondary causes of lower extremity swelling. - Keep your legs elevated when resting to minimize swelling, Wear compression stockings while working to decrease inflammation. -Continue to monitor symptoms for any change in severity if there is any escalation of current symptoms or development of new symptoms follow-up in ER for further evaluation and management.

## 2024-06-04 NOTE — ED Triage Notes (Signed)
 Pt c/o leg weakness and foot swelling for 1 week.

## 2024-06-09 DIAGNOSIS — I10 Essential (primary) hypertension: Secondary | ICD-10-CM | POA: Diagnosis not present

## 2024-06-09 DIAGNOSIS — R2241 Localized swelling, mass and lump, right lower limb: Secondary | ICD-10-CM | POA: Diagnosis not present

## 2024-06-09 DIAGNOSIS — R600 Localized edema: Secondary | ICD-10-CM | POA: Diagnosis not present

## 2024-06-09 DIAGNOSIS — R2242 Localized swelling, mass and lump, left lower limb: Secondary | ICD-10-CM | POA: Diagnosis not present

## 2024-06-09 DIAGNOSIS — I8390 Asymptomatic varicose veins of unspecified lower extremity: Secondary | ICD-10-CM | POA: Diagnosis not present

## 2024-06-10 ENCOUNTER — Ambulatory Visit: Admitting: Physical Therapy

## 2024-06-10 ENCOUNTER — Encounter: Payer: Self-pay | Admitting: Physical Therapy

## 2024-06-10 DIAGNOSIS — R2689 Other abnormalities of gait and mobility: Secondary | ICD-10-CM

## 2024-06-10 DIAGNOSIS — R29898 Other symptoms and signs involving the musculoskeletal system: Secondary | ICD-10-CM | POA: Diagnosis not present

## 2024-06-10 DIAGNOSIS — R262 Difficulty in walking, not elsewhere classified: Secondary | ICD-10-CM

## 2024-06-10 NOTE — Therapy (Signed)
 OUTPATIENT PHYSICAL THERAPY LOWER EXTREMITY TREATMENT   Patient Name: Tina Ellis MRN: [id number] DOB:1941-09-12, 83 y.o., female Today's Date: 06/10/2024  END OF SESSION:  PT End of Session - 06/10/24 1521     Visit Number 7    Date for PT Re-Evaluation 07/16/24    PT Start Time 1521    PT Stop Time 1600    PT Time Calculation (min) 39 min    Activity Tolerance Patient tolerated treatment well    Behavior During Therapy [facility] for tasks assessed/performed             Past Medical History:  Diagnosis Date   Family history of breast cancer    Family history of colon cancer    Family history of lung cancer    GERD (gastroesophageal reflux disease)    Hypercholesteremia    Hypertension    left breast ca 06/2021   Port-A-Cath in place 07/21/2021   Seasonal allergies    TIA (transient ischemic attack)    Past Surgical History:  Procedure Laterality Date   BACK SURGERY     x 2   CHOLECYSTECTOMY     MASTECTOMY     PARTIAL HYSTERECTOMY     Patient Active Problem List   Diagnosis Date Noted   Genetic testing 09/06/2021   Family history of colon cancer 07/18/2021   Family history of breast cancer 07/18/2021   Family history of lung cancer 07/18/2021   Malignant neoplasm of upper-outer quadrant of left breast in female, estrogen receptor negative (HCC) 07/12/2021   Elevated troponin 04/09/2017   TIA (transient ischemic attack) 04/08/2017   Abdominal pain    Stroke-like symptom 04/06/2017   HTN (hypertension) 04/06/2017   GERD (gastroesophageal reflux disease) 04/06/2017   Influenza A 12/15/2013   Hypotension 12/14/2013   Acute renal failure (HCC) 12/14/2013   Lower urinary tract infectious disease 12/13/2013   Hypotension, unspecified 12/13/2013   Dehydration 12/13/2013    PCP: Felipe Horton, MD  REFERRING PROVIDER: Yvonnie Heritage NP  REFERRING DIAG: B LE weakness  THERAPY DIAG:  Difficulty in walking, not elsewhere classified  Balance problem  Leg  weakness, bilateral  Rationale for Evaluation and Treatment: Rehabilitation  ONSET DATE: 3 months  SUBJECTIVE:   SUBJECTIVE STATEMENT:  Bp is low 107/60, having a little pain in the low back and L arm   PERTINENT HISTORY:    Hx of breast cancer and back surgery   PAIN:  Are you having pain? No 5/10 Back  PRECAUTIONS: Fall  RED FLAGS: None   WEIGHT BEARING RESTRICTIONS: No  FALLS:  Has patient fallen in last 6 months? No  LIVING ENVIRONMENT: Lives with: lives with their family Lives in: House/apartment Stairs: Yes: External: 2 steps; on right going up Has following equipment at home: Single point cane  OCCUPATION: retired  PLOF: Independent  PATIENT GOALS: to get more stable, avoid falling, be able to get up easier out of chair  NEXT MD VISIT: end of May  OBJECTIVE:  Note: Objective measures were completed at Evaluation unless otherwise noted.  DIAGNOSTIC FINDINGS: na   COGNITION: Overall cognitive status: Within functional limits for tasks assessed     SENSATION: WFL  EDEMA: B ankles mod edema, no discoloration noted      POSTURE: mild flexion B hips and knees in standing  PALPATION: na  LOWER EXTREMITY [id number] wfl   LOWER EXTREMITY MMT:  MMT Right eval Left eval Right 06/10/24 Left 06/10/24  Hip flexion 4- 4- 4 4  Hip extension      Hip abduction 3+ 3+ 4+ 4+  Hip adduction      Hip internal rotation      Hip external rotation      Knee flexion      Knee extension 3+ 3+ 4 4  Ankle dorsiflexion 3+ 3+ 4+ 4+  Ankle plantarflexion 4 4 4+ 4+  Ankle inversion      Ankle eversion       (Blank rows = not tested)   FUNCTIONAL TESTS:  30 seconds chair stand test 10 Timed up and go (TUG): 13.38  FGA 20/30  GAIT: Distance walked: in clinic up to 50' Assistive device utilized: Single point cane and Grab bars Level of assistance: Modified independence Comments: some increased deviation outside of path                                                                                                                                 TREATMENT DATE:  06/10/24 NuStep L 4 x 7 min Goals   MMT  30 sec sit to stand Hs curls 20lb 2x10 Leg Ext 5lb 2x10 6in Step ups 2x5 some UE use  Leg press 20lb 2x10  05/27/24 Nustep level 5 x 6 min Sit to stand 2x10 some UE assist  LAQ 2lb 2x10 Standing hip abd 2lb x10 each HS curls green 2x10 Seated Hip Abd green 2x10 Hip add ball squeeze 2x10  05/21/24:  Nustep level 5 x 6 min Long arc quads 1 1/2 # 10 x 2 each  Seated hip abd/ER green t band 15 x 1 Seated red t band ankle pumps 12 x each leg  Sit to stand from elevated mat 5 x 2 sets Red t band hamstring curls 12 x 2  Standing in ll bars for semi tandem standing, 15 sec each foot    05/13/24 NuStep L 5 x 6 min Sit to stand from elevated surface 2x5 LAQ 1lb 2x10  Seated March 1lb 2x10 total HS curls red 2x10 Gait 1 lap no AD  05/05/24  Nustep L4x8 minutes all four extremities seat 9 STS red TB around knees 2x10  Gait with 3# each LE for general conditioning 2x137ft Tandem stance solid surface 3x30 seconds B  Bridges red TB 2x10 Sidelying clams red TB 1x10  LAQs 3# 2x10 B   04/29/24 6 min walk test- 777ft with one rest stop due to dizziness  LAQ 2# 2x10 HS curls red 2x10 STS 2x10 Box taps 4  Step ups 4  NuStep L4 x50mins   04/23/24 evaluation    PATIENT EDUCATION:  Education details: POC, goals Person educated: Patient Education method: Explanation, Demonstration, Tactile cues, and Verbal cues Education comprehension: verbalized understanding  HOME EXERCISE PROGRAM: TBDAccess Code: [email or web address] URL: [email or web address] Date: 06/10/2024 Prepared by: Towanda Fret  Exercises - Supine Bridge  - 1 x daily - 7 x weekly - 3 sets - 10 reps - Sit  to Stand  - 1 x daily - 7 x weekly - 3 sets - 10 reps  ASSESSMENT:  CLINICAL IMPRESSION: Patient enters doing well, HEP established She has  progressed towards goals increasing her LE strength and increasing her 30 second sit to stand reps. Progressed session with machine level interventions.  Instability with step ups, pt requiring contact guard to min assist at times..  Needed frequent intermittent rest breaks due to increase fatigue. She is to continue 1 x week to address her LE strength and activity tolerance.     EVAL: Patient is an 83 y.o. female who was evaluate today by physical therapy  for recent onset of lower extremity weakness.  Noted B quads weakness primarily, actually scored well with TUG and 30 sec sit to stand, but score on FGA indicates high fall risk.  Noted loss of balance especially with horizontal head turns.  She should benefit from skilled PT to address her deficits, she does not participate in any formal exercise routine, so initially she should improve her strength and endurance, balance with structured cardiovascular training and B LE strengthening.  She will be obtaining lab work in a few weeks and then follow up with her MD to determine whether there are any other underlying conditions contributing to her recent onset of weakness.   OBJECTIVE IMPAIRMENTS: decreased activity tolerance, decreased balance, decreased endurance, difficulty walking, and decreased strength.   ACTIVITY LIMITATIONS: carrying, lifting, standing, squatting, stairs, and transfers  PARTICIPATION LIMITATIONS: cleaning, laundry, shopping, and community activity  PERSONAL FACTORS: Age, Behavior pattern, Fitness, Past/current experiences, Time since onset of injury/illness/exacerbation, and 1-2 comorbidities: h/o breast Ca, HTN, H/o TIA are also affecting patient's functional outcome.   REHAB POTENTIAL: Good  CLINICAL DECISION MAKING: Evolving/moderate complexity  EVALUATION COMPLEXITY: Moderate   GOALS: Goals reviewed with patient? Yes  SHORT TERM GOALS: Target date: 2 weeks, 05/07/24 I HEP Baseline: Goal status: INITIAL   LONG  TERM GOALS: Target date: 12 weeks, 07/16/24  Improve FGA from 20/30 to 27/30 Baseline:  Goal status: INITIAL  2.  Improve MMT B quads to 5/5 for improved ease with sit to stand movement Baseline:  Goal status: Progressing 06/10/24  3.  Improve 30 sec sit to stand from 10 reps to 13 reps  Baseline:  Goal status: Met 06/10/24  4.  Will complete 851ft in 6 min walk test no rest breaks Baseline: 780ft with 1 rest break, got lightheaded and needed to sit Goal status: INITIAL   PLAN:  PT FREQUENCY: 2x/week  PT DURATION: 12 weeks  PLANNED INTERVENTIONS: 97110-Therapeutic exercises, 97530- Therapeutic activity, 97112- Neuromuscular re-education, 97535- Self Care, and 09811- Manual therapy  PLAN FOR NEXT SESSION: progress cardiovascular machines, LE strengthening , balance challenges  Towanda Fret, PTA 06/10/24 3:21 PM

## 2024-06-13 ENCOUNTER — Other Ambulatory Visit: Payer: Self-pay | Admitting: Hematology and Oncology

## 2024-06-13 DIAGNOSIS — Z1231 Encounter for screening mammogram for malignant neoplasm of breast: Secondary | ICD-10-CM

## 2024-06-16 DIAGNOSIS — I83891 Varicose veins of right lower extremities with other complications: Secondary | ICD-10-CM | POA: Diagnosis not present

## 2024-06-17 ENCOUNTER — Ambulatory Visit: Admitting: Physical Therapy

## 2024-06-17 ENCOUNTER — Encounter: Payer: Self-pay | Admitting: Physical Therapy

## 2024-06-17 DIAGNOSIS — R262 Difficulty in walking, not elsewhere classified: Secondary | ICD-10-CM

## 2024-06-17 DIAGNOSIS — R29898 Other symptoms and signs involving the musculoskeletal system: Secondary | ICD-10-CM | POA: Diagnosis not present

## 2024-06-17 DIAGNOSIS — R2689 Other abnormalities of gait and mobility: Secondary | ICD-10-CM | POA: Diagnosis not present

## 2024-06-17 NOTE — Therapy (Signed)
 OUTPATIENT PHYSICAL THERAPY LOWER EXTREMITY TREATMENT   Patient Name: Tina Ellis MRN: [id number] DOB:May 08, 1941, 83 y.o., female Today's Date: 06/17/2024  END OF SESSION:  PT End of Session - 06/17/24 1549     Visit Number 8    Date for PT Re-Evaluation 07/16/24    PT Start Time 1550    PT Stop Time 1635    PT Time Calculation (min) 45 min    Activity Tolerance Patient tolerated treatment well    Behavior During Therapy [facility] for tasks assessed/performed             Past Medical History:  Diagnosis Date   Family history of breast cancer    Family history of colon cancer    Family history of lung cancer    GERD (gastroesophageal reflux disease)    Hypercholesteremia    Hypertension    left breast ca 06/2021   Port-A-Cath in place 07/21/2021   Seasonal allergies    TIA (transient ischemic attack)    Past Surgical History:  Procedure Laterality Date   BACK SURGERY     x 2   CHOLECYSTECTOMY     MASTECTOMY     PARTIAL HYSTERECTOMY     Patient Active Problem List   Diagnosis Date Noted   Genetic testing 09/06/2021   Family history of colon cancer 07/18/2021   Family history of breast cancer 07/18/2021   Family history of lung cancer 07/18/2021   Malignant neoplasm of upper-outer quadrant of left breast in female, estrogen receptor negative (HCC) 07/12/2021   Elevated troponin 04/09/2017   TIA (transient ischemic attack) 04/08/2017   Abdominal pain    Stroke-like symptom 04/06/2017   HTN (hypertension) 04/06/2017   GERD (gastroesophageal reflux disease) 04/06/2017   Influenza A 12/15/2013   Hypotension 12/14/2013   Acute renal failure (HCC) 12/14/2013   Lower urinary tract infectious disease 12/13/2013   Hypotension, unspecified 12/13/2013   Dehydration 12/13/2013    PCP: Clarice Finder, MD  REFERRING PROVIDER: Corlis Pagan NP  REFERRING DIAG: B LE weakness  THERAPY DIAG:  Difficulty in walking, not elsewhere classified  Balance problem  Leg  weakness, bilateral  Rationale for Evaluation and Treatment: Rehabilitation  ONSET DATE: 3 months  SUBJECTIVE:   SUBJECTIVE STATEMENT:  Just tired Went to a vein specialist yesterday, made her stand for over an hour. Legs are so weak   PERTINENT HISTORY:    Hx of breast cancer and back surgery   PAIN:  Are you having pain? No 5/10 Both legs  PRECAUTIONS: Fall  RED FLAGS: None   WEIGHT BEARING RESTRICTIONS: No  FALLS:  Has patient fallen in last 6 months? No  LIVING ENVIRONMENT: Lives with: lives with their family Lives in: House/apartment Stairs: Yes: External: 2 steps; on right going up Has following equipment at home: Single point cane  OCCUPATION: retired  PLOF: Independent  PATIENT GOALS: to get more stable, avoid falling, be able to get up easier out of chair  NEXT MD VISIT: end of May  OBJECTIVE:  Note: Objective measures were completed at Evaluation unless otherwise noted.  DIAGNOSTIC FINDINGS: na   COGNITION: Overall cognitive status: Within functional limits for tasks assessed     SENSATION: WFL  EDEMA: B ankles mod edema, no discoloration noted      POSTURE: mild flexion B hips and knees in standing  PALPATION: na  LOWER EXTREMITY MNF:hmnddob wfl   LOWER EXTREMITY MMT:  MMT Right eval Left eval Right 06/10/24 Left 06/10/24  Hip flexion  4- 4- 4 4  Hip extension      Hip abduction 3+ 3+ 4+ 4+  Hip adduction      Hip internal rotation      Hip external rotation      Knee flexion      Knee extension 3+ 3+ 4 4  Ankle dorsiflexion 3+ 3+ 4+ 4+  Ankle plantarflexion 4 4 4+ 4+  Ankle inversion      Ankle eversion       (Blank rows = not tested)   FUNCTIONAL TESTS:  30 seconds chair stand test 10 Timed up and go (TUG): 13.38  FGA 20/30  GAIT: Distance walked: in clinic up to 50' Assistive device utilized: Single point cane and Grab bars Level of assistance: Modified independence Comments: some increased deviation  outside of path                                                                                                                                TREATMENT DATE:  06/17/24 NuStep L4 x 7 min Hs curls 20lb 2x10 Leg Ext 5lb 2x10 4in lateal step ups x10 each CGA  Sit to stand holding red ball 2x10 Hip abd  green 2x10 Hip add ball squeeze 2x10  06/10/24 NuStep L 4 x 7 min Goals   MMT  30 sec sit to stand Hs curls 20lb 2x10 Leg Ext 5lb 2x10 6in Step ups 2x5 some UE use  Leg press 20lb 2x10  05/27/24 Nustep level 5 x 6 min Sit to stand 2x10 some UE assist  LAQ 2lb 2x10 Standing hip abd 2lb x10 each HS curls green 2x10 Seated Hip Abd green 2x10 Hip add ball squeeze 2x10  05/21/24:  Nustep level 5 x 6 min Long arc quads 1 1/2 # 10 x 2 each  Seated hip abd/ER green t band 15 x 1 Seated red t band ankle pumps 12 x each leg  Sit to stand from elevated mat 5 x 2 sets Red t band hamstring curls 12 x 2  Standing in ll bars for semi tandem standing, 15 sec each foot    05/13/24 NuStep L 5 x 6 min Sit to stand from elevated surface 2x5 LAQ 1lb 2x10  Seated March 1lb 2x10 total HS curls red 2x10 Gait 1 lap no AD  05/05/24  Nustep L4x8 minutes all four extremities seat 9 STS red TB around knees 2x10  Gait with 3# each LE for general conditioning 2x146ft Tandem stance solid surface 3x30 seconds B  Bridges red TB 2x10 Sidelying clams red TB 1x10  LAQs 3# 2x10 B   04/29/24 6 min walk test- 737ft with one rest stop due to dizziness  LAQ 2# 2x10 HS curls red 2x10 STS 2x10 Box taps 4  Step ups 4  NuStep L4 x34mins   04/23/24 evaluation    PATIENT EDUCATION:  Education details: POC, goals Person educated: Patient Education method: Explanation, Demonstration, Actor  cues, and Verbal cues Education comprehension: verbalized understanding  HOME EXERCISE PROGRAM: TBDAccess Code: [email or web address] URL: [email or web address] Date: 06/10/2024 Prepared by: Tanda Sorrow  Exercises - Supine Bridge  - 1 x daily - 7 x weekly - 3 sets - 10 reps - Sit to Stand  - 1 x daily - 7 x weekly - 3 sets - 10 reps  ASSESSMENT:  CLINICAL IMPRESSION: Patient enters with reports of leg fatigue and pain from visiting the vein specialist. Some instability present with lateral step ups. Cue for full ROM needed with leg curls and retentions. Added more functional interventions.  Needed frequent intermittent rest breaks due to increase fatigue. She is to continue 1 x week to address her LE strength and activity tolerance.     EVAL: Patient is an 83 y.o. female who was evaluate today by physical therapy  for recent onset of lower extremity weakness.  Noted B quads weakness primarily, actually scored well with TUG and 30 sec sit to stand, but score on FGA indicates high fall risk.  Noted loss of balance especially with horizontal head turns.  She should benefit from skilled PT to address her deficits, she does not participate in any formal exercise routine, so initially she should improve her strength and endurance, balance with structured cardiovascular training and B LE strengthening.  She will be obtaining lab work in a few weeks and then follow up with her MD to determine whether there are any other underlying conditions contributing to her recent onset of weakness.   OBJECTIVE IMPAIRMENTS: decreased activity tolerance, decreased balance, decreased endurance, difficulty walking, and decreased strength.   ACTIVITY LIMITATIONS: carrying, lifting, standing, squatting, stairs, and transfers  PARTICIPATION LIMITATIONS: cleaning, laundry, shopping, and community activity  PERSONAL FACTORS: Age, Behavior pattern, Fitness, Past/current experiences, Time since onset of injury/illness/exacerbation, and 1-2 comorbidities: h/o breast Ca, HTN, H/o TIA are also affecting patient's functional outcome.   REHAB POTENTIAL: Good  CLINICAL DECISION MAKING: Evolving/moderate  complexity  EVALUATION COMPLEXITY: Moderate   GOALS: Goals reviewed with patient? Yes  SHORT TERM GOALS: Target date: 2 weeks, 05/07/24 I HEP Baseline: Goal status: Met 06/17/24   LONG TERM GOALS: Target date: 12 weeks, 07/16/24  Improve FGA from 20/30 to 27/30 Baseline:  Goal status: INITIAL  2.  Improve MMT B quads to 5/5 for improved ease with sit to stand movement Baseline:  Goal status: Progressing 06/10/24  3.  Improve 30 sec sit to stand from 10 reps to 13 reps  Baseline:  Goal status: Met 06/10/24  4.  Will complete 836ft in 6 min walk test no rest breaks Baseline: 716ft with 1 rest break, got lightheaded and needed to sit Goal status: INITIAL   PLAN:  PT FREQUENCY: 2x/week  PT DURATION: 12 weeks  PLANNED INTERVENTIONS: 97110-Therapeutic exercises, 97530- Therapeutic activity, 97112- Neuromuscular re-education, 97535- Self Care, and 02859- Manual therapy  PLAN FOR NEXT SESSION: progress cardiovascular machines, LE strengthening , balance challenges  Tanda Sorrow, PTA 06/17/24 3:49 PM

## 2024-06-24 ENCOUNTER — Ambulatory Visit: Attending: Internal Medicine | Admitting: Physical Therapy

## 2024-06-24 ENCOUNTER — Encounter: Payer: Self-pay | Admitting: Physical Therapy

## 2024-06-24 DIAGNOSIS — R29898 Other symptoms and signs involving the musculoskeletal system: Secondary | ICD-10-CM | POA: Diagnosis not present

## 2024-06-24 DIAGNOSIS — R262 Difficulty in walking, not elsewhere classified: Secondary | ICD-10-CM | POA: Diagnosis not present

## 2024-06-24 DIAGNOSIS — R2689 Other abnormalities of gait and mobility: Secondary | ICD-10-CM | POA: Insufficient documentation

## 2024-06-24 NOTE — Therapy (Signed)
 OUTPATIENT PHYSICAL THERAPY LOWER EXTREMITY TREATMENT   Patient Name: Tina Ellis MRN: [id number] DOB:23-May-1941, 83 y.o., female Today's Date: 06/24/2024  END OF SESSION:  PT End of Session - 06/24/24 1558     Visit Number 9    Date for PT Re-Evaluation 07/16/24    PT Start Time 1600    PT Stop Time 1645    PT Time Calculation (min) 45 min    Activity Tolerance Patient tolerated treatment well    Behavior During Therapy [facility] for tasks assessed/performed             Past Medical History:  Diagnosis Date   Family history of breast cancer    Family history of colon cancer    Family history of lung cancer    GERD (gastroesophageal reflux disease)    Hypercholesteremia    Hypertension    left breast ca 06/2021   Port-A-Cath in place 07/21/2021   Seasonal allergies    TIA (transient ischemic attack)    Past Surgical History:  Procedure Laterality Date   BACK SURGERY     x 2   CHOLECYSTECTOMY     MASTECTOMY     PARTIAL HYSTERECTOMY     Patient Active Problem List   Diagnosis Date Noted   Genetic testing 09/06/2021   Family history of colon cancer 07/18/2021   Family history of breast cancer 07/18/2021   Family history of lung cancer 07/18/2021   Malignant neoplasm of upper-outer quadrant of left breast in female, estrogen receptor negative (HCC) 07/12/2021   Elevated troponin 04/09/2017   TIA (transient ischemic attack) 04/08/2017   Abdominal pain    Stroke-like symptom 04/06/2017   HTN (hypertension) 04/06/2017   GERD (gastroesophageal reflux disease) 04/06/2017   Influenza A 12/15/2013   Hypotension 12/14/2013   Acute renal failure (HCC) 12/14/2013   Lower urinary tract infectious disease 12/13/2013   Hypotension, unspecified 12/13/2013   Dehydration 12/13/2013    PCP: Clarice Finder, MD  REFERRING PROVIDER: Corlis Pagan NP  REFERRING DIAG: B LE weakness  THERAPY DIAG:  Difficulty in walking, not elsewhere classified  Balance problem  Leg  weakness, bilateral  Rationale for Evaluation and Treatment: Rehabilitation  ONSET DATE: 3 months  SUBJECTIVE:   SUBJECTIVE STATEMENT:  Just tired stood in line at the [facility] for an hour. Had a fall Sunday getting up off the couch but not sure how, was able to get up on her own   PERTINENT HISTORY:    Hx of breast cancer and back surgery   PAIN:  Are you having pain? No 5/10 Both legs  PRECAUTIONS: Fall  RED FLAGS: None   WEIGHT BEARING RESTRICTIONS: No  FALLS:  Has patient fallen in last 6 months? No  LIVING ENVIRONMENT: Lives with: lives with their family Lives in: House/apartment Stairs: Yes: External: 2 steps; on right going up Has following equipment at home: Single point cane  OCCUPATION: retired  PLOF: Independent  PATIENT GOALS: to get more stable, avoid falling, be able to get up easier out of chair  NEXT MD VISIT: end of May  OBJECTIVE:  Note: Objective measures were completed at Evaluation unless otherwise noted.  DIAGNOSTIC FINDINGS: na   COGNITION: Overall cognitive status: Within functional limits for tasks assessed     SENSATION: WFL  EDEMA: B ankles mod edema, no discoloration noted      POSTURE: mild flexion B hips and knees in standing  PALPATION: na  LOWER EXTREMITY MNF:hmnddob wfl   LOWER EXTREMITY MMT:  MMT Right eval Left eval Right 06/10/24 Left 06/10/24  Hip flexion 4- 4- 4 4  Hip extension      Hip abduction 3+ 3+ 4+ 4+  Hip adduction      Hip internal rotation      Hip external rotation      Knee flexion      Knee extension 3+ 3+ 4 4  Ankle dorsiflexion 3+ 3+ 4+ 4+  Ankle plantarflexion 4 4 4+ 4+  Ankle inversion      Ankle eversion       (Blank rows = not tested)   FUNCTIONAL TESTS:  30 seconds chair stand test 10 Timed up and go (TUG): 13.38  FGA 20/30  GAIT: Distance walked: in clinic up to 50' Assistive device utilized: Single point cane and Grab bars Level of assistance: Modified  independence Comments: some increased deviation outside of path                                                                                                                                TREATMENT DATE:  06/24/24 NuStep L4 x 7 min 6in step ups x10 Hs curls 25lb 2x10 Leg Ext 5lb 2x10 FGA 25/30 Sit to stand holding red ball 2x10  06/17/24 NuStep L4 x 7 min Hs curls 20lb 2x10 Leg Ext 5lb 2x10 4in lateal step ups x10 each CGA  Sit to stand holding red ball 2x10 Hip abd  green 2x10 Hip add ball squeeze 2x10  06/10/24 NuStep L 4 x 7 min Goals   MMT  30 sec sit to stand Hs curls 20lb 2x10 Leg Ext 5lb 2x10 6in Step ups 2x5 some UE use  Leg press 20lb 2x10  05/27/24 Nustep level 5 x 6 min Sit to stand 2x10 some UE assist  LAQ 2lb 2x10 Standing hip abd 2lb x10 each HS curls green 2x10 Seated Hip Abd green 2x10 Hip add ball squeeze 2x10  05/21/24:  Nustep level 5 x 6 min Long arc quads 1 1/2 # 10 x 2 each  Seated hip abd/ER green t band 15 x 1 Seated red t band ankle pumps 12 x each leg  Sit to stand from elevated mat 5 x 2 sets Red t band hamstring curls 12 x 2  Standing in ll bars for semi tandem standing, 15 sec each foot    05/13/24 NuStep L 5 x 6 min Sit to stand from elevated surface 2x5 LAQ 1lb 2x10  Seated March 1lb 2x10 total HS curls red 2x10 Gait 1 lap no AD  05/05/24  Nustep L4x8 minutes all four extremities seat 9 STS red TB around knees 2x10  Gait with 3# each LE for general conditioning 2x146ft Tandem stance solid surface 3x30 seconds B  Bridges red TB 2x10 Sidelying clams red TB 1x10  LAQs 3# 2x10 B   04/29/24 6 min walk test- 732ft with one rest stop due to dizziness  LAQ 2#  2x10 HS curls red 2x10 STS 2x10 Box taps 4  Step ups 4  NuStep L4 x28mins   04/23/24 evaluation    PATIENT EDUCATION:  Education details: POC, goals Person educated: Patient Education method: Explanation, Demonstration, Tactile cues, and Verbal  cues Education comprehension: verbalized understanding  HOME EXERCISE PROGRAM: TBDAccess Code: [email or web address] URL: [email or web address] Date: 06/10/2024 Prepared by: Tanda Sorrow  Exercises - Supine Bridge  - 1 x daily - 7 x weekly - 3 sets - 10 reps - Sit to Stand  - 1 x daily - 7 x weekly - 3 sets - 10 reps  ASSESSMENT:  CLINICAL IMPRESSION: Patient enters with reports of leg fatigue from standing in line at the [facility]. Despite repots he has progressed increasing her FGA score. Some instability present with step ups, cues also needed for sequencing. Cue for full ROM needed with leg curls and extensions.  Needed frequent intermittent rest breaks due to increase fatigue. She is to continue 1 x week to address her LE strength and activity tolerance.     EVAL: Patient is an 83 y.o. female who was evaluate today by physical therapy  for recent onset of lower extremity weakness.  Noted B quads weakness primarily, actually scored well with TUG and 30 sec sit to stand, but score on FGA indicates high fall risk.  Noted loss of balance especially with horizontal head turns.  She should benefit from skilled PT to address her deficits, she does not participate in any formal exercise routine, so initially she should improve her strength and endurance, balance with structured cardiovascular training and B LE strengthening.  She will be obtaining lab work in a few weeks and then follow up with her MD to determine whether there are any other underlying conditions contributing to her recent onset of weakness.   OBJECTIVE IMPAIRMENTS: decreased activity tolerance, decreased balance, decreased endurance, difficulty walking, and decreased strength.   ACTIVITY LIMITATIONS: carrying, lifting, standing, squatting, stairs, and transfers  PARTICIPATION LIMITATIONS: cleaning, laundry, shopping, and community activity  PERSONAL FACTORS: Age, Behavior pattern, Fitness, Past/current experiences, Time since  onset of injury/illness/exacerbation, and 1-2 comorbidities: h/o breast Ca, HTN, H/o TIA are also affecting patient's functional outcome.   REHAB POTENTIAL: Good  CLINICAL DECISION MAKING: Evolving/moderate complexity  EVALUATION COMPLEXITY: Moderate   GOALS: Goals reviewed with patient? Yes  SHORT TERM GOALS: Target date: 2 weeks, 05/07/24 I HEP Baseline: Goal status: Met 06/17/24   LONG TERM GOALS: Target date: 12 weeks, 07/16/24  Improve FGA from 20/30 to 27/30 Baseline:  Goal status: Progressing 25/30  2.  Improve MMT B quads to 5/5 for improved ease with sit to stand movement Baseline:  Goal status: Progressing 06/10/24  3.  Improve 30 sec sit to stand from 10 reps to 13 reps  Baseline:  Goal status: Met 06/10/24  4.  Will complete 851ft in 6 min walk test no rest breaks Baseline: 725ft with 1 rest break, got lightheaded and needed to sit Goal status: ongoing 06/24/24   PLAN:  PT FREQUENCY: 2x/week  PT DURATION: 12 weeks  PLANNED INTERVENTIONS: 97110-Therapeutic exercises, 97530- Therapeutic activity, 97112- Neuromuscular re-education, 97535- Self Care, and [id number]- Manual therapy  PLAN FOR NEXT SESSION: progress cardiovascular machines, LE strengthening , balance challenges  Tanda Sorrow, PTA 06/24/24 4:00 PM

## 2024-06-30 ENCOUNTER — Encounter (HOSPITAL_COMMUNITY)

## 2024-07-01 ENCOUNTER — Encounter: Payer: Self-pay | Admitting: Physical Therapy

## 2024-07-01 ENCOUNTER — Ambulatory Visit: Admitting: Physical Therapy

## 2024-07-01 DIAGNOSIS — R262 Difficulty in walking, not elsewhere classified: Secondary | ICD-10-CM

## 2024-07-01 DIAGNOSIS — R29898 Other symptoms and signs involving the musculoskeletal system: Secondary | ICD-10-CM

## 2024-07-01 DIAGNOSIS — R2689 Other abnormalities of gait and mobility: Secondary | ICD-10-CM | POA: Diagnosis not present

## 2024-07-01 NOTE — Therapy (Signed)
 OUTPATIENT PHYSICAL THERAPY LOWER EXTREMITY TREATMENT Progress Note Reporting Period 04/23/24 to 07/01/24  See note below for Objective Data and Assessment of Progress/Goals.      Patient Name: Tina Ellis MRN: [id number] DOB:August 07, 1941, 83 y.o., female Today's Date: 07/01/2024  END OF SESSION:  PT End of Session - 07/01/24 1519     Visit Number 10    Date for PT Re-Evaluation 07/16/24    PT Start Time 1519    PT Stop Time 1600    PT Time Calculation (min) 41 min    Activity Tolerance Patient tolerated treatment well    Behavior During Therapy [facility] for tasks assessed/performed             Past Medical History:  Diagnosis Date   Family history of breast cancer    Family history of colon cancer    Family history of lung cancer    GERD (gastroesophageal reflux disease)    Hypercholesteremia    Hypertension    left breast ca 06/2021   Port-A-Cath in place 07/21/2021   Seasonal allergies    TIA (transient ischemic attack)    Past Surgical History:  Procedure Laterality Date   BACK SURGERY     x 2   CHOLECYSTECTOMY     MASTECTOMY     PARTIAL HYSTERECTOMY     Patient Active Problem List   Diagnosis Date Noted   Genetic testing 09/06/2021   Family history of colon cancer 07/18/2021   Family history of breast cancer 07/18/2021   Family history of lung cancer 07/18/2021   Malignant neoplasm of upper-outer quadrant of left breast in female, estrogen receptor negative (HCC) 07/12/2021   Elevated troponin 04/09/2017   TIA (transient ischemic attack) 04/08/2017   Abdominal pain    Stroke-like symptom 04/06/2017   HTN (hypertension) 04/06/2017   GERD (gastroesophageal reflux disease) 04/06/2017   Influenza A 12/15/2013   Hypotension 12/14/2013   Acute renal failure (HCC) 12/14/2013   Lower urinary tract infectious disease 12/13/2013   Hypotension, unspecified 12/13/2013   Dehydration 12/13/2013    PCP: Clarice Finder, MD  REFERRING PROVIDER: Corlis Pagan  NP  REFERRING DIAG: B LE weakness  THERAPY DIAG:  Difficulty in walking, not elsewhere classified  Balance problem  Leg weakness, bilateral  Rationale for Evaluation and Treatment: Rehabilitation  ONSET DATE: 3 months No SUBJECTIVE:   SUBJECTIVE STATEMENT:  Just tired   PERTINENT HISTORY:    Hx of breast cancer and back surgery   PAIN:  Are you having pain?  4/10 Both leg back  PRECAUTIONS: Fall  RED FLAGS: None   WEIGHT BEARING RESTRICTIONS: No  FALLS:  Has patient fallen in last 6 months? No  LIVING ENVIRONMENT: Lives with: lives with their family Lives in: House/apartment Stairs: Yes: External: 2 steps; on right going up Has following equipment at home: Single point cane  OCCUPATION: retired  PLOF: Independent  PATIENT GOALS: to get more stable, avoid falling, be able to get up easier out of chair  NEXT MD VISIT: end of May  OBJECTIVE:  Note: Objective measures were completed at Evaluation unless otherwise noted.  DIAGNOSTIC FINDINGS: na   COGNITION: Overall cognitive status: Within functional limits for tasks assessed     SENSATION: WFL  EDEMA: B ankles mod edema, no discoloration noted      POSTURE: mild flexion B hips and knees in standing  PALPATION: na  LOWER EXTREMITY MNF:hmnddob wfl   LOWER EXTREMITY MMT:  MMT Right eval Left eval Right 06/10/24  Left 06/10/24 Right 07/01/24 Left 07/01/24  Hip flexion 4- 4- 4 4 4+ 4  Hip extension        Hip abduction 3+ 3+ 4+ 4+ 5 5  Hip adduction        Hip internal rotation        Hip external rotation        Knee flexion        Knee extension 3+ 3+ 4 4 5  4+  Ankle dorsiflexion 3+ 3+ 4+ 4+ 5 5  Ankle plantarflexion 4 4 4+ 4+    Ankle inversion        Ankle eversion         (Blank rows = not tested)   FUNCTIONAL TESTS:  30 seconds chair stand test 10 Timed up and go (TUG): 13.38  FGA 20/30  GAIT: Distance walked: in clinic up to 50' Assistive device utilized: Single point  cane and Grab bars Level of assistance: Modified independence Comments: some increased deviation outside of path                                                                                                                                TREATMENT DATE:  07/01/24 NuStep L5 x6 min GOALS   MMT  6 min walk test  Sit to stand holding red ball 2x10 Hs curls 25lb 2x10 Leg Ext 5lb 2x10  06/24/24 NuStep L4 x 7 min 6in step ups x10 Hs curls 25lb 2x10 Leg Ext 5lb 2x10 FGA 25/30 Sit to stand holding red ball 2x10  06/17/24 NuStep L4 x 7 min Hs curls 20lb 2x10 Leg Ext 5lb 2x10 4in lateal step ups x10 each CGA  Sit to stand holding red ball 2x10 Hip abd  green 2x10 Hip add ball squeeze 2x10  06/10/24 NuStep L 4 x 7 min Goals   MMT  30 sec sit to stand Hs curls 20lb 2x10 Leg Ext 5lb 2x10 6in Step ups 2x5 some UE use  Leg press 20lb 2x10  05/27/24 Nustep level 5 x 6 min Sit to stand 2x10 some UE assist  LAQ 2lb 2x10 Standing hip abd 2lb x10 each HS curls green 2x10 Seated Hip Abd green 2x10 Hip add ball squeeze 2x10  05/21/24:  Nustep level 5 x 6 min Long arc quads 1 1/2 # 10 x 2 each  Seated hip abd/ER green t band 15 x 1 Seated red t band ankle pumps 12 x each leg  Sit to stand from elevated mat 5 x 2 sets Red t band hamstring curls 12 x 2  Standing in ll bars for semi tandem standing, 15 sec each foot    05/13/24 NuStep L 5 x 6 min Sit to stand from elevated surface 2x5 LAQ 1lb 2x10  Seated March 1lb 2x10 total HS curls red 2x10 Gait 1 lap no AD  05/05/24  Nustep L4x8 minutes all four extremities seat 9 STS red TB around  knees 2x10  Gait with 3# each LE for general conditioning 2x182ft Tandem stance solid surface 3x30 seconds B  Bridges red TB 2x10 Sidelying clams red TB 1x10  LAQs 3# 2x10 B    PATIENT EDUCATION:  Education details: POC, goals Person educated: Patient Education method: Explanation, Demonstration, Tactile cues, and Verbal  cues Education comprehension: verbalized understanding  HOME EXERCISE PROGRAM: TBDAccess Code: [email or web address] URL: [email or web address] Date: 06/10/2024 Prepared by: Tanda Sorrow  Exercises - Supine Bridge  - 1 x daily - 7 x weekly - 3 sets - 10 reps - Sit to Stand  - 1 x daily - 7 x weekly - 3 sets - 10 reps  ASSESSMENT:  CLINICAL IMPRESSION: Pt enters gain with reports of fatigue. She has progressed increasing her LE strength and meeting her 6 min walk test goal.  Cue for full ROM needed with leg curls and extensions.  Needed frequent intermittent rest breaks due to increase fatigue. She is to continue 1 x week to address her LE strength and activity tolerance.     EVAL: Patient is an 83 y.o. female who was evaluate today by physical therapy  for recent onset of lower extremity weakness.  Noted B quads weakness primarily, actually scored well with TUG and 30 sec sit to stand, but score on FGA indicates high fall risk.  Noted loss of balance especially with horizontal head turns.  She should benefit from skilled PT to address her deficits, she does not participate in any formal exercise routine, so initially she should improve her strength and endurance, balance with structured cardiovascular training and B LE strengthening.  She will be obtaining lab work in a few weeks and then follow up with her MD to determine whether there are any other underlying conditions contributing to her recent onset of weakness.   OBJECTIVE IMPAIRMENTS: decreased activity tolerance, decreased balance, decreased endurance, difficulty walking, and decreased strength.   ACTIVITY LIMITATIONS: carrying, lifting, standing, squatting, stairs, and transfers  PARTICIPATION LIMITATIONS: cleaning, laundry, shopping, and community activity  PERSONAL FACTORS: Age, Behavior pattern, Fitness, Past/current experiences, Time since onset of injury/illness/exacerbation, and 1-2 comorbidities: h/o breast Ca, HTN,  H/o TIA are also affecting patient's functional outcome.   REHAB POTENTIAL: Good  CLINICAL DECISION MAKING: Evolving/moderate complexity  EVALUATION COMPLEXITY: Moderate   GOALS: Goals reviewed with patient? Yes  SHORT TERM GOALS: Target date: 2 weeks, 05/07/24 I HEP Baseline: Goal status: Met 06/17/24   LONG TERM GOALS: Target date: 12 weeks, 07/16/24  Improve FGA from 20/30 to 27/30 Baseline:  Goal status: Progressing 25/30  2.  Improve MMT B quads to 5/5 for improved ease with sit to stand movement Baseline:  Goal status: Progressing 06/10/24, Progressing 07/01/24  3.  Improve 30 sec sit to stand from 10 reps to 13 reps  Baseline:  Goal status: Met 06/10/24  4.  Will complete 859ft in 6 min walk test no rest breaks Baseline: 716ft with 1 rest break, got lightheaded and needed to sit Goal status: ongoing 06/24/24, Met  87ft in 3:57 min 07/01/24   PLAN:  PT FREQUENCY: 2x/week  PT DURATION: 12 weeks  PLANNED INTERVENTIONS: 97110-Therapeutic exercises, 97530- Therapeutic activity, 97112- Neuromuscular re-education, 97535- Self Care, and 02859- Manual therapy  PLAN FOR NEXT SESSION: progress cardiovascular machines, LE strengthening , balance challenges  Tanda Sorrow, PTA 07/01/24 3:20 PM

## 2024-07-07 ENCOUNTER — Ambulatory Visit

## 2024-07-07 DIAGNOSIS — M79661 Pain in right lower leg: Secondary | ICD-10-CM | POA: Diagnosis not present

## 2024-07-07 DIAGNOSIS — I83891 Varicose veins of right lower extremities with other complications: Secondary | ICD-10-CM | POA: Diagnosis not present

## 2024-07-07 DIAGNOSIS — R600 Localized edema: Secondary | ICD-10-CM | POA: Diagnosis not present

## 2024-07-07 DIAGNOSIS — R252 Cramp and spasm: Secondary | ICD-10-CM | POA: Diagnosis not present

## 2024-07-09 ENCOUNTER — Ambulatory Visit: Admitting: Physical Therapy

## 2024-07-09 ENCOUNTER — Encounter: Payer: Self-pay | Admitting: Physical Therapy

## 2024-07-09 DIAGNOSIS — R2689 Other abnormalities of gait and mobility: Secondary | ICD-10-CM | POA: Diagnosis not present

## 2024-07-09 DIAGNOSIS — R29898 Other symptoms and signs involving the musculoskeletal system: Secondary | ICD-10-CM

## 2024-07-09 DIAGNOSIS — R262 Difficulty in walking, not elsewhere classified: Secondary | ICD-10-CM | POA: Diagnosis not present

## 2024-07-09 NOTE — Therapy (Signed)
 OUTPATIENT PHYSICAL THERAPY LOWER EXTREMITY TREATMENT   Patient Name: Tina Ellis MRN: [id number] DOB:[birth year], 83 y.o., female Today's Date: 07/09/2024  END OF SESSION:  PT End of Session - 07/09/24 1526     Visit Number 11    Date for PT Re-Evaluation 07/16/24    Authorization Type MCR    PT Start Time 1526    PT Stop Time 1612    PT Time Calculation (min) 46 min    Activity Tolerance Patient tolerated treatment well    Behavior During Therapy [facility] for tasks assessed/performed             Past Medical History:  Diagnosis Date   Family history of breast cancer    Family history of colon cancer    Family history of lung cancer    GERD (gastroesophageal reflux disease)    Hypercholesteremia    Hypertension    left breast ca 06/2021   Port-A-Cath in place 07/21/2021   Seasonal allergies    TIA (transient ischemic attack)    Past Surgical History:  Procedure Laterality Date   BACK SURGERY     x 2   CHOLECYSTECTOMY     MASTECTOMY     PARTIAL HYSTERECTOMY     Patient Active Problem List   Diagnosis Date Noted   Genetic testing 09/06/2021   Family history of colon cancer 07/18/2021   Family history of breast cancer 07/18/2021   Family history of lung cancer 07/18/2021   Malignant neoplasm of upper-outer quadrant of left breast in female, estrogen receptor negative (HCC) 07/12/2021   Elevated troponin 04/09/2017   TIA (transient ischemic attack) 04/08/2017   Abdominal pain    Stroke-like symptom 04/06/2017   HTN (hypertension) 04/06/2017   GERD (gastroesophageal reflux disease) 04/06/2017   Influenza A 12/15/2013   Hypotension 12/14/2013   Acute renal failure (HCC) 12/14/2013   Lower urinary tract infectious disease 12/13/2013   Hypotension, unspecified 12/13/2013   Dehydration 12/13/2013    PCP: Clarice Finder, MD  REFERRING PROVIDER: Corlis Pagan NP  REFERRING DIAG: B LE weakness  THERAPY DIAG:  Difficulty in walking, not elsewhere  classified  Balance problem  Leg weakness, bilateral  Rationale for Evaluation and Treatment: Rehabilitation  ONSET DATE: 3 months No SUBJECTIVE:   SUBJECTIVE STATEMENT:  Patient reports that she fell in the shower last week, reports was washing hair, she is unsure if she lost balance or slipped, reports that it has grab bars   PERTINENT HISTORY:    Hx of breast cancer and back surgery   PAIN:  Are you having pain?  4/10 Both leg back  PRECAUTIONS: Fall  RED FLAGS: None   WEIGHT BEARING RESTRICTIONS: No  FALLS:  Has patient fallen in last 6 months? No  LIVING ENVIRONMENT: Lives with: lives with their family Lives in: House/apartment Stairs: Yes: External: 2 steps; on right going up Has following equipment at home: Single point cane  OCCUPATION: retired  PLOF: Independent  PATIENT GOALS: to get more stable, avoid falling, be able to get up easier out of chair  NEXT MD VISIT: end of May  OBJECTIVE:  Note: Objective measures were completed at Evaluation unless otherwise noted.  DIAGNOSTIC FINDINGS: na   COGNITION: Overall cognitive status: Within functional limits for tasks assessed     SENSATION: WFL  EDEMA: B ankles mod edema, no discoloration noted      POSTURE: mild flexion B hips and knees in standing  PALPATION: na  LOWER EXTREMITY MNF:hmnddob wfl  LOWER EXTREMITY MMT:  MMT Right eval Left eval Right 06/10/24 Left 06/10/24 Right 07/01/24 Left 07/01/24  Hip flexion 4- 4- 4 4 4+ 4  Hip extension        Hip abduction 3+ 3+ 4+ 4+ 5 5  Hip adduction        Hip internal rotation        Hip external rotation        Knee flexion        Knee extension 3+ 3+ 4 4 5  4+  Ankle dorsiflexion 3+ 3+ 4+ 4+ 5 5  Ankle plantarflexion 4 4 4+ 4+    Ankle inversion        Ankle eversion         (Blank rows = not tested)   FUNCTIONAL TESTS:  30 seconds chair stand test 10 Timed up and go (TUG): 13.38  FGA 20/30  GAIT: Distance walked: in  clinic up to 50' Assistive device utilized: Single point cane and Grab bars Level of assistance: Modified independence Comments: some increased deviation outside of path                                                                                                                                TREATMENT DATE:  07/09/24 Nustep level 5 x 6 minutes Side stepping Direction changes caused some dizziness On airex ball toss needs CGA/MinA On airex cone toe touch CGA/MinA Stairs step over step  20# resisted gait fwd and backward CGA STS with yellow weighted ball Walking ball toss Ball kicks  07/01/24 NuStep L5 x6 min GOALS   MMT  6 min walk test  Sit to stand holding red ball 2x10 Hs curls 25lb 2x10 Leg Ext 5lb 2x10  06/24/24 NuStep L4 x 7 min 6in step ups x10 Hs curls 25lb 2x10 Leg Ext 5lb 2x10 FGA 25/30 Sit to stand holding red ball 2x10  06/17/24 NuStep L4 x 7 min Hs curls 20lb 2x10 Leg Ext 5lb 2x10 4in lateal step ups x10 each CGA  Sit to stand holding red ball 2x10 Hip abd  green 2x10 Hip add ball squeeze 2x10  06/10/24 NuStep L 4 x 7 min Goals   MMT  30 sec sit to stand Hs curls 20lb 2x10 Leg Ext 5lb 2x10 6in Step ups 2x5 some UE use  Leg press 20lb 2x10  05/27/24 Nustep level 5 x 6 min Sit to stand 2x10 some UE assist  LAQ 2lb 2x10 Standing hip abd 2lb x10 each HS curls green 2x10 Seated Hip Abd green 2x10 Hip add ball squeeze 2x10  05/21/24:  Nustep level 5 x 6 min Long arc quads 1 1/2 # 10 x 2 each  Seated hip abd/ER green t band 15 x 1 Seated red t band ankle pumps 12 x each leg  Sit to stand from elevated mat 5 x 2 sets Red t band hamstring curls 12 x 2  Standing in  ll bars for semi tandem standing, 15 sec each foot    05/13/24 NuStep L 5 x 6 min Sit to stand from elevated surface 2x5 LAQ 1lb 2x10  Seated March 1lb 2x10 total HS curls red 2x10 Gait 1 lap no AD  05/05/24  Nustep L4x8 minutes all four extremities seat 9 STS red TB around  knees 2x10  Gait with 3# each LE for general conditioning 2x140ft Tandem stance solid surface 3x30 seconds B  Bridges red TB 2x10 Sidelying clams red TB 1x10  LAQs 3# 2x10 B    PATIENT EDUCATION:  Education details: POC, goals Person educated: Patient Education method: Explanation, Demonstration, Tactile cues, and Verbal cues Education comprehension: verbalized understanding  HOME EXERCISE PROGRAM: TBDAccess Code: [email or web address] URL: [email or web address] Date: 06/10/2024 Prepared by: Tanda Sorrow  Exercises - Supine Bridge  - 1 x daily - 7 x weekly - 3 sets - 10 reps - Sit to Stand  - 1 x daily - 7 x weekly - 3 sets - 10 reps  ASSESSMENT:  CLINICAL IMPRESSION: Patient had a fall last week, in the shower, I worked much more on balance today she really struggles with the dynamic surface standing.  Very unsure of herself and fearful, she also did not like eyes closed.  She does continue to c/o leg fatigue and weakness.  I did give her a medical supply company to call and ask about getting a shower chair/bench   EVAL: Patient is an 84 y.o. female who was evaluate today by physical therapy  for recent onset of lower extremity weakness.  Noted B quads weakness primarily, actually scored well with TUG and 30 sec sit to stand, but score on FGA indicates high fall risk.  Noted loss of balance especially with horizontal head turns.  She should benefit from skilled PT to address her deficits, she does not participate in any formal exercise routine, so initially she should improve her strength and endurance, balance with structured cardiovascular training and B LE strengthening.  She will be obtaining lab work in a few weeks and then follow up with her MD to determine whether there are any other underlying conditions contributing to her recent onset of weakness.   OBJECTIVE IMPAIRMENTS: decreased activity tolerance, decreased balance, decreased endurance, difficulty walking, and  decreased strength.   ACTIVITY LIMITATIONS: carrying, lifting, standing, squatting, stairs, and transfers  PARTICIPATION LIMITATIONS: cleaning, laundry, shopping, and community activity  PERSONAL FACTORS: Age, Behavior pattern, Fitness, Past/current experiences, Time since onset of injury/illness/exacerbation, and 1-2 comorbidities: h/o breast Ca, HTN, H/o TIA are also affecting patient's functional outcome.   REHAB POTENTIAL: Good  CLINICAL DECISION MAKING: Evolving/moderate complexity  EVALUATION COMPLEXITY: Moderate   GOALS: Goals reviewed with patient? Yes  SHORT TERM GOALS: Target date: 2 weeks, 05/07/24 I HEP Baseline: Goal status: Met 06/17/24   LONG TERM GOALS: Target date: 12 weeks, 07/16/24  Improve FGA from 20/30 to 27/30 Baseline:  Goal status: Progressing 25/30 07/09/24  2.  Improve MMT B quads to 5/5 for improved ease with sit to stand movement Baseline:  Goal status: Progressing 06/10/24, Progressing 07/01/24  3.  Improve 30 sec sit to stand from 10 reps to 13 reps  Baseline:  Goal status: Met 06/10/24  4.  Will complete 832ft in 6 min walk test no rest breaks Baseline: 753ft with 1 rest break, got lightheaded and needed to sit Goal status: ongoing 06/24/24, Met  896ft in 3:57 min 07/01/24   PLAN:  PT FREQUENCY: 2x/week  PT DURATION:  12 weeks  PLANNED INTERVENTIONS: 97110-Therapeutic exercises, 97530- Therapeutic activity, [id number]- Neuromuscular re-education, 97535- Self Care, and 02859- Manual therapy  PLAN FOR NEXT SESSION: address balance and leg weakness  Ozell Mainland, PT 07/09/24 3:27 PM

## 2024-07-11 ENCOUNTER — Ambulatory Visit
Admission: RE | Admit: 2024-07-11 | Discharge: 2024-07-11 | Disposition: A | Discharge status: Home / Self Care | Source: Ambulatory Visit | Attending: Hematology and Oncology | Admitting: Hematology and Oncology

## 2024-07-11 ENCOUNTER — Ambulatory Visit

## 2024-07-11 DIAGNOSIS — Z1231 Encounter for screening mammogram for malignant neoplasm of breast: Secondary | ICD-10-CM

## 2024-07-11 HISTORY — DX: Personal history of irradiation: Z92.3

## 2024-07-11 HISTORY — DX: Personal history of antineoplastic chemotherapy: Z92.21

## 2024-07-16 ENCOUNTER — Ambulatory Visit: Admitting: Physical Therapy

## 2024-07-16 ENCOUNTER — Other Ambulatory Visit: Payer: Self-pay

## 2024-07-16 DIAGNOSIS — I872 Venous insufficiency (chronic) (peripheral): Secondary | ICD-10-CM

## 2024-07-16 NOTE — Progress Notes (Unsigned)
 Office Note     CC: Lower extremity edema with varicose veins Requesting Provider:  Aurea Ethel NOVAK, NP  HPI: Tina Ellis is a 83 y.o. (October 05, 1941) female who presents at the request of Tina Nottingham, MD for evaluation of lower extremity edema with varicose veins.  On exam, Tina Ellis was doing well.  A [place] native, she graduated from [employer]. She worked for years at [employer], a family owned business.  She continues to live an active lifestyle and retirement.  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  Tina Ellis has appreciated varicosities on bilateral lower extremities for a number of years.  She is also appreciated swelling, which is worse by days end.  She is currently working with physical therapy after sustaining a fall several weeks ago, and having dizziness worked up in the outpatient setting.  Per Tina Ellis, she just does not feel as sure on her feet as she used to.  She denies significant aching, but does note that her legs get tired by days end.  She denies bleeding or ulceration.  She had trouble using compression stockings, stating they were too tight. No history of DVT, no history of venous procedures.   Past Medical History:  Diagnosis Date   Family history of breast cancer    Family history of colon cancer    Family history of lung cancer    GERD (gastroesophageal reflux disease)    Hypercholesteremia    Hypertension    left breast ca 06/2021   Personal history of chemotherapy    pt had one treatment   Personal history of radiation therapy    Port-A-Cath in place 07/21/2021   Seasonal allergies    TIA (transient ischemic attack)     Past Surgical History:  Procedure Laterality Date   BACK SURGERY     x 2   CHOLECYSTECTOMY     MASTECTOMY     PARTIAL HYSTERECTOMY      Social History   Socioeconomic History   Marital status: Widowed    Spouse name: Not on file   Number of children: 2   Years of education: HS    Highest education level: Not on file  Occupational History   Occupation: Part-time Airline pilot  Tobacco Use   Smoking status: Never   Smokeless tobacco: Never  Vaping Use   Vaping status: Never Used  Substance and Sexual Activity   Alcohol use: No   Drug use: No   Sexual activity: Not Currently  Other Topics Concern   Not on file  Social History Narrative   Lives at home with son and grandson.   Right-handed.   1-2 cups caffeine per day.   Social Drivers of Corporate investment banker Strain: Low Risk  (08/24/2021)   Received [facility]   Overall Financial Resource Strain (CARDIA)    Difficulty of Paying Living Expenses: Not very hard  Food Insecurity: Food Insecurity Present (06/19/2023)   Hunger Vital Sign    Worried About Running Out of Food in the Last Year: Sometimes true    Ran Out of Food in the Last Year: Never true  Transportation Needs: Not on file  Physical Activity: Not on file  Stress: No Stress Concern Present (01/24/2022)   Received from [facility] - Occupational Stress Questionnaire    Feeling of Stress : Only a little  Social Connections: Unknown (04/28/2022)   Received from [facility]   Social  Network    Social Network: Not on file  Intimate Partner Violence: Unknown (03/27/2022)   Received from [facility]   HITS    Physically Hurt: Not on file    Insult or Talk Down To: Not on file    Threaten Physical Harm: Not on file    Scream or Curse: Not on file   Family History  Problem Relation Age of Onset   Colon cancer Mother    Lung cancer Father    Breast cancer Maternal Aunt    Crohn's disease Brother     Current Outpatient Medications  Medication Sig Dispense Refill   amLODipine  (NORVASC ) 2.5 MG tablet Take 2.5 mg by mouth daily.     aspirin  EC 81 MG EC tablet Take 1 tablet (81 mg total) by mouth daily.     carvedilol  (COREG ) 12.5 MG tablet Take 1 tablet (12.5 mg total) by mouth 2 (two) times  daily with a meal. (Patient taking differently: Take 3.125 mg by mouth 2 (two) times daily with a meal.) 60 tablet 0   cefdinir  (OMNICEF ) 300 MG capsule Take 1 capsule (300 mg total) by mouth 2 (two) times daily. 14 capsule 0   cetirizine (ZYRTEC) 10 MG tablet Take 10 mg by mouth at bedtime.     cholecalciferol (VITAMIN D ) 1000 UNITS tablet Take 2,000 Units by mouth every morning.     Cholecalciferol 50 MCG (2000 UT) CAPS 2,000 capsules.     fluticasone (FLONASE) 50 MCG/ACT nasal spray Place 1 spray into both nostrils daily.     hydrochlorothiazide  (HYDRODIURIL ) 12.5 MG tablet Take 12.5 mg by mouth daily. 1.5 only when it is high     losartan  (COZAAR ) 100 MG tablet Take 1 tablet by mouth daily.     metroNIDAZOLE  (FLAGYL ) 500 MG tablet Take 1 tablet (500 mg total) by mouth 2 (two) times daily. 14 tablet 0   omeprazole (PRILOSEC) 20 MG capsule Take 20 mg by mouth every morning.      rosuvastatin  (CRESTOR ) 10 MG tablet Take 1 tablet (10 mg total) by mouth every evening. 30 tablet 0   No current facility-administered medications for this visit.    Allergies  Allergen Reactions   Conjugated Estrogens Other (See Comments)   Fish Oil Rash   Nitrofurantoin Nausea And Vomiting   Benadryl  Itch Stopping [Diphenhydramine  Hcl] Other (See Comments)    Tingling in hands and feet. Trouble speaking    Other     Other reaction(s): nausea   Pneumococcal 13-Val Conj Vacc Other (See Comments)   Pneumococcal 13-Val Conj Vacc     Other Reaction(s): URI, local swelling, redness     REVIEW OF SYSTEMS:  [X]  denotes positive finding, [ ]  denotes negative finding Cardiac  Comments:  Chest pain or chest pressure:    Shortness of breath upon exertion:    Short of breath when lying flat:    Irregular heart rhythm:        Vascular    Pain in calf, thigh, or hip brought on by ambulation:    Pain in feet at night that wakes you up from your sleep:     Blood clot in your veins:    Leg swelling:          Pulmonary    Oxygen at home:    Productive cough:     Wheezing:         Neurologic    Sudden weakness in arms or legs:     Sudden numbness  in arms or legs:     Sudden onset of difficulty speaking or slurred speech:    Temporary loss of vision in one eye:     Problems with dizziness:         Gastrointestinal    Blood in stool:     Vomited blood:         Genitourinary    Burning when urinating:     Blood in urine:        Psychiatric    Major depression:         Hematologic    Bleeding problems:    Problems with blood clotting too easily:        Skin    Rashes or ulcers:        Constitutional    Fever or chills:      PHYSICAL EXAMINATION:  There were no vitals filed for this visit.  General:  WDWN in NAD; vital signs documented above Gait: Not observed HENT: WNL, normocephalic Pulmonary: normal non-labored breathing , without Rales, rhonchi,  wheezing Cardiac: regular HR Abdomen: soft, NT, no masses Skin: without rashes Vascular Exam/Pulses:  Right Left  Radial 2+ (normal) 2+ (normal)  Ulnar    Femoral    Popliteal    DP 2+ (normal) 2+ (normal)  PT     Extremities: without ischemic changes, without Gangrene , without cellulitis; without open wounds;  Musculoskeletal: no muscle wasting or atrophy  Neurologic: A&O X 3;  No focal weakness or paresthesias are detected Psychiatric:  The pt has Normal affect.   Non-Invasive Vascular Imaging:     +----------------+---------+------+----------+------------+----------------  ----+  LEFT           Reflux NoReflux  Reflux  Diameter cmsComments                                         Yes     Time                                      +----------------+---------+------+----------+------------+----------------  ----+  CFV            no                644                not > 1 sec            +----------------+---------+------+----------+------------+----------------  ----+  FV mid           no                                                          +----------------+---------+------+----------+------------+----------------  ----+  GSV at [facility]      no                           0.66                           +----------------+---------+------+----------+------------+----------------  ----+  GSV prox thigh  yes   >500 ms      0.38                           +----------------+---------+------+----------+------------+----------------  ----+  GSV mid thigh             yes   >500 ms      0.38                           +----------------+---------+------+----------+------------+----------------  ----+  GSV dist thigh  no                           0.34                           +----------------+---------+------+----------+------------+----------------  ----+  GSV at knee     no                           0.20                           +----------------+---------+------+----------+------------+----------------  ----+  GSV prox calf             yes   >500 ms      0.23                           +----------------+---------+------+----------+------------+----------------  ----+  GSV mid calf              yes   >500 ms      0.24                           +----------------+---------+------+----------+------------+----------------  ----+  GSV dist calf             yes   >500 ms      0.17                           +----------------+---------+------+----------+------------+----------------  ----+  SSV at [facility]      no                           0.20                           +----------------+---------+------+----------+------------+----------------  ----+  AA branch prox            yes   >500 ms      0.43    branches into          thigh                                                varicosities at  mid  thigh                   +----------------+---------+------+----------+------------+----------------  ----+  VV mid thigh              yes   >500 ms                                     +----------------+---------+------+----------+------------+----------------  ----+     ASSESSMENT/PLAN:: 83 y.o. female presenting with bilateral lower extremity edema which is worse by days end.  On physical exam, she had palpable pedal pulses bilaterally. 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  No skin thinning, no ulceration  Venous reflux study was reviewed demonstrating reflux throughout the greater saphenous vein.  Interestingly, the greater saphenous vein is very small.  At its current size, the vein could not be ablated.  I had a long conversation with Jannifer regarding the above.  With her mild symptoms, I think she would be best served with continued medical management.  We discussed trying a lower graded compression stocking so that she is able to don the stockings on herself.   She was also given literature describing the importance of exercise, elevation as well as compression to help the lower extremity edema.  At this time, Mariaguadalupe can follow-up with me as needed.    Fonda FORBES Rim, MD [facility] [phone]

## 2024-07-17 ENCOUNTER — Ambulatory Visit (HOSPITAL_COMMUNITY)
Admission: RE | Admit: 2024-07-17 | Discharge: 2024-07-17 | Disposition: A | Discharge status: Home / Self Care | Source: Ambulatory Visit | Attending: Vascular Surgery | Admitting: Vascular Surgery

## 2024-07-17 ENCOUNTER — Encounter: Payer: Self-pay | Admitting: Vascular Surgery

## 2024-07-17 ENCOUNTER — Ambulatory Visit: Admitting: Vascular Surgery

## 2024-07-17 VITALS — BP 141/82 | HR 69 | Temp 98.0°F | Resp 18 | Ht 66.0 in | Wt 131.9 lb

## 2024-07-17 DIAGNOSIS — I872 Venous insufficiency (chronic) (peripheral): Secondary | ICD-10-CM | POA: Insufficient documentation

## 2024-07-17 DIAGNOSIS — M7989 Other specified soft tissue disorders: Secondary | ICD-10-CM

## 2024-07-23 ENCOUNTER — Encounter: Payer: Self-pay | Admitting: Physical Therapy

## 2024-07-23 ENCOUNTER — Ambulatory Visit: Admitting: Physical Therapy

## 2024-07-23 DIAGNOSIS — R29898 Other symptoms and signs involving the musculoskeletal system: Secondary | ICD-10-CM

## 2024-07-23 DIAGNOSIS — R2689 Other abnormalities of gait and mobility: Secondary | ICD-10-CM | POA: Diagnosis not present

## 2024-07-23 DIAGNOSIS — R262 Difficulty in walking, not elsewhere classified: Secondary | ICD-10-CM

## 2024-07-23 NOTE — Therapy (Signed)
 OUTPATIENT PHYSICAL THERAPY LOWER EXTREMITY TREATMENT   Patient Name: ZAIDA REILAND MRN: [id number] DOB:1941-03-16, 83 y.o., female Today's Date: 07/23/2024  END OF SESSION:  PT End of Session - 07/23/24 1530     Visit Number 12    Date for PT Re-Evaluation 07/16/24    PT Start Time 1515    PT Stop Time 1600    PT Time Calculation (min) 45 min    Activity Tolerance Patient tolerated treatment well    Behavior During Therapy [facility] for tasks assessed/performed             Past Medical History:  Diagnosis Date   Family history of breast cancer    Family history of colon cancer    Family history of lung cancer    GERD (gastroesophageal reflux disease)    Hypercholesteremia    Hypertension    left breast ca 06/2021   Personal history of chemotherapy    pt had one treatment   Personal history of radiation therapy    Port-A-Cath in place 07/21/2021   Seasonal allergies    TIA (transient ischemic attack)    Past Surgical History:  Procedure Laterality Date   BACK SURGERY     x 2   CHOLECYSTECTOMY     MASTECTOMY     PARTIAL HYSTERECTOMY     Patient Active Problem List   Diagnosis Date Noted   Genetic testing 09/06/2021   Family history of colon cancer 07/18/2021   Family history of breast cancer 07/18/2021   Family history of lung cancer 07/18/2021   Malignant neoplasm of upper-outer quadrant of left breast in female, estrogen receptor negative (HCC) 07/12/2021   Elevated troponin 04/09/2017   TIA (transient ischemic attack) 04/08/2017   Abdominal pain    Stroke-like symptom 04/06/2017   HTN (hypertension) 04/06/2017   GERD (gastroesophageal reflux disease) 04/06/2017   Influenza A 12/15/2013   Hypotension 12/14/2013   Acute renal failure (HCC) 12/14/2013   Lower urinary tract infectious disease 12/13/2013   Hypotension, unspecified 12/13/2013   Dehydration 12/13/2013    PCP: Clarice Finder, MD  REFERRING PROVIDER: Corlis Pagan NP  REFERRING DIAG: B LE  weakness  THERAPY DIAG:  Difficulty in walking, not elsewhere classified  Balance problem  Leg weakness, bilateral  Rationale for Evaluation and Treatment: Rehabilitation  ONSET DATE: 3 months No SUBJECTIVE:   SUBJECTIVE STATEMENT:  Patient reports that she fell in the shower last week, reports was washing hair, she is unsure if she lost balance or slipped, reports that it has grab bars   PERTINENT HISTORY:    Hx of breast cancer and back surgery   PAIN:  Are you having pain?  4/10 Both leg back  PRECAUTIONS: Fall  RED FLAGS: None   WEIGHT BEARING RESTRICTIONS: No  FALLS:  Has patient fallen in last 6 months? No  LIVING ENVIRONMENT: Lives with: lives with their family Lives in: House/apartment Stairs: Yes: External: 2 steps; on right going up Has following equipment at home: Single point cane  OCCUPATION: retired  PLOF: Independent  PATIENT GOALS: to get more stable, avoid falling, be able to get up easier out of chair  NEXT MD VISIT: end of May  OBJECTIVE:  Note: Objective measures were completed at Evaluation unless otherwise noted.  DIAGNOSTIC FINDINGS: na   COGNITION: Overall cognitive status: Within functional limits for tasks assessed     SENSATION: WFL  EDEMA: B ankles mod edema, no discoloration noted      POSTURE: mild flexion  B hips and knees in standing  PALPATION: na  LOWER EXTREMITY MNF:hmnddob wfl   LOWER EXTREMITY MMT:  MMT Right eval Left eval Right 06/10/24 Left 06/10/24 Right 07/01/24 Left 07/01/24 Right  07/23/24 Left  07/23/24  Hip flexion 4- 4- 4 4 4+ 4 5 4+  Hip extension          Hip abduction 3+ 3+ 4+ 4+ 5 5 5 5   Hip adduction          Hip internal rotation          Hip external rotation          Knee flexion          Knee extension 3+ 3+ 4 4 5  4+ 5 4+  Ankle dorsiflexion 3+ 3+ 4+ 4+ 5 5 5 5   Ankle plantarflexion 4 4 4+ 4+      Ankle inversion          Ankle eversion           (Blank rows = not  tested)   FUNCTIONAL TESTS:  30 seconds chair stand test 10 Timed up and go (TUG): 13.38  FGA 20/30  GAIT: Distance walked: in clinic up to 50' Assistive device utilized: Single point cane and Grab bars Level of assistance: Modified independence Comments: some increased deviation outside of path                                                                                                                                TREATMENT DATE:  07/23/24 Bike L2.5 x 4 min Goals   MMT  20# resisted gait fwd and backward CGA 6in step ups x10 each  07/09/24 Nustep level 5 x 6 minutes Side stepping Direction changes caused some dizziness On airex ball toss needs CGA/MinA On airex cone toe touch CGA/MinA Stairs step over step  20# resisted gait fwd and backward CGA STS with yellow weighted ball Walking ball toss Ball kicks  07/01/24 NuStep L5 x6 min GOALS   MMT  6 min walk test  Sit to stand holding red ball 2x10 Hs curls 25lb 2x10 Leg Ext 5lb 2x10  06/24/24 NuStep L4 x 7 min 6in step ups x10 Hs curls 25lb 2x10 Leg Ext 5lb 2x10 FGA 25/30 Sit to stand holding red ball 2x10  06/17/24 NuStep L4 x 7 min Hs curls 20lb 2x10 Leg Ext 5lb 2x10 4in lateal step ups x10 each CGA  Sit to stand holding red ball 2x10 Hip abd  green 2x10 Hip add ball squeeze 2x10  06/10/24 NuStep L 4 x 7 min Goals   MMT  30 sec sit to stand Hs curls 20lb 2x10 Leg Ext 5lb 2x10 6in Step ups 2x5 some UE use  Leg press 20lb 2x10  05/27/24 Nustep level 5 x 6 min Sit to stand 2x10 some UE assist  LAQ 2lb 2x10 Standing hip abd 2lb x10 each HS curls green 2x10 Seated  Hip Abd green 2x10 Hip add ball squeeze 2x10  05/21/24:  Nustep level 5 x 6 min Long arc quads 1 1/2 # 10 x 2 each  Seated hip abd/ER green t band 15 x 1 Seated red t band ankle pumps 12 x each leg  Sit to stand from elevated mat 5 x 2 sets Red t band hamstring curls 12 x 2  Standing in ll bars for semi tandem standing, 15 sec each  foot  PATIENT EDUCATION:  Education details: POC, goals Person educated: Patient Education method: Explanation, Demonstration, Tactile cues, and Verbal cues Education comprehension: verbalized understanding  HOME EXERCISE PROGRAM: TBDAccess Code: [email or web address] URL: [email or web address] Date: 06/10/2024 Prepared by: Tanda Sorrow  Exercises - Supine Bridge  - 1 x daily - 7 x weekly - 3 sets - 10 reps - Sit to Stand  - 1 x daily - 7 x weekly - 3 sets - 10 reps  ASSESSMENT:  CLINICAL IMPRESSION: Pt remains very unsure of herself and fearful when it comes to ber balance.  She does continue to c/o leg fatigue and weakness.  Pt has chronic venus insuffiencey. Pt repots having edema, fatigue, and heaviness in both LE. PT also states her legs feel like rubber bands, with a decrease ability to feel her feet. Slight improvement made with MMT. Greatly encouraged her to be mote active at home.   EVAL: Patient is an 83 y.o. female who was evaluate today by physical therapy  for recent onset of lower extremity weakness.  Noted B quads weakness primarily, actually scored well with TUG and 30 sec sit to stand, but score on FGA indicates high fall risk.  Noted loss of balance especially with horizontal head turns.  She should benefit from skilled PT to address her deficits, she does not participate in any formal exercise routine, so initially she should improve her strength and endurance, balance with structured cardiovascular training and B LE strengthening.  She will be obtaining lab work in a few weeks and then follow up with her MD to determine whether there are any other underlying conditions contributing to her recent onset of weakness.   OBJECTIVE IMPAIRMENTS: decreased activity tolerance, decreased balance, decreased endurance, difficulty walking, and decreased strength.   ACTIVITY LIMITATIONS: carrying, lifting, standing, squatting, stairs, and transfers  PARTICIPATION LIMITATIONS:  cleaning, laundry, shopping, and community activity  PERSONAL FACTORS: Age, Behavior pattern, Fitness, Past/current experiences, Time since onset of injury/illness/exacerbation, and 1-2 comorbidities: h/o breast Ca, HTN, H/o TIA are also affecting patient's functional outcome.   REHAB POTENTIAL: Good  CLINICAL DECISION MAKING: Evolving/moderate complexity  EVALUATION COMPLEXITY: Moderate   GOALS: Goals reviewed with patient? Yes  SHORT TERM GOALS: Target date: 2 weeks, 05/07/24 I HEP Baseline: Goal status: Met 06/17/24   LONG TERM GOALS: Target date: 12 weeks, 07/16/24  Improve FGA from 20/30 to 27/30 Baseline:  Goal status: Progressing 25/30 07/09/24  2.  Improve MMT B quads to 5/5 for improved ease with sit to stand movement Baseline:  Goal status: Progressing 06/10/24, Progressing 07/01/24, Progressing 07/23/24  3.  Improve 30 sec sit to stand from 10 reps to 13 reps  Baseline:  Goal status: Met 06/10/24  4.  Will complete 843ft in 6 min walk test no rest breaks Baseline: 766ft with 1 rest break, got lightheaded and needed to sit Goal status: ongoing 06/24/24, Met  823ft in 3:57 min 07/01/24   PLAN:  PT FREQUENCY: 2x/week  PT DURATION: 12 weeks  PLANNED INTERVENTIONS: 97110-Therapeutic exercises, 97530- Therapeutic  activity, [id number]- Neuromuscular re-education, [phone]- Self Care, and 02859- Manual therapy  PLAN FOR NEXT SESSION: address balance and leg weakness  Tanda Sorrow, PTA 07/23/24 3:31 PM

## 2024-07-24 ENCOUNTER — Encounter

## 2024-07-30 ENCOUNTER — Ambulatory Visit: Attending: Internal Medicine | Admitting: Physical Therapy

## 2024-07-30 ENCOUNTER — Encounter: Payer: Self-pay | Admitting: Physical Therapy

## 2024-07-30 DIAGNOSIS — R262 Difficulty in walking, not elsewhere classified: Secondary | ICD-10-CM | POA: Insufficient documentation

## 2024-07-30 DIAGNOSIS — R29898 Other symptoms and signs involving the musculoskeletal system: Secondary | ICD-10-CM | POA: Insufficient documentation

## 2024-07-30 DIAGNOSIS — R2689 Other abnormalities of gait and mobility: Secondary | ICD-10-CM | POA: Diagnosis not present

## 2024-07-30 NOTE — Therapy (Signed)
 OUTPATIENT PHYSICAL THERAPY LOWER EXTREMITY TREATMENT   Patient Name: Tina Ellis MRN: [id number] DOB:1941-05-21, 83 y.o., female Today's Date: 07/30/2024  END OF SESSION:  PT End of Session - 07/30/24 1513     Visit Number 13    Date for PT Re-Evaluation 08/24/24    PT Start Time 1515    PT Stop Time 1600    PT Time Calculation (min) 45 min    Activity Tolerance Patient tolerated treatment well    Behavior During Therapy [facility] for tasks assessed/performed             Past Medical History:  Diagnosis Date   Family history of breast cancer    Family history of colon cancer    Family history of lung cancer    GERD (gastroesophageal reflux disease)    Hypercholesteremia    Hypertension    left breast ca 06/2021   Personal history of chemotherapy    pt had one treatment   Personal history of radiation therapy    Port-A-Cath in place 07/21/2021   Seasonal allergies    TIA (transient ischemic attack)    Past Surgical History:  Procedure Laterality Date   BACK SURGERY     x 2   CHOLECYSTECTOMY     MASTECTOMY     PARTIAL HYSTERECTOMY     Patient Active Problem List   Diagnosis Date Noted   Genetic testing 09/06/2021   Family history of colon cancer 07/18/2021   Family history of breast cancer 07/18/2021   Family history of lung cancer 07/18/2021   Malignant neoplasm of upper-outer quadrant of left breast in female, estrogen receptor negative (HCC) 07/12/2021   Elevated troponin 04/09/2017   TIA (transient ischemic attack) 04/08/2017   Abdominal pain    Stroke-like symptom 04/06/2017   HTN (hypertension) 04/06/2017   GERD (gastroesophageal reflux disease) 04/06/2017   Influenza A 12/15/2013   Hypotension 12/14/2013   Acute renal failure (HCC) 12/14/2013   Lower urinary tract infectious disease 12/13/2013   Hypotension, unspecified 12/13/2013   Dehydration 12/13/2013    PCP: Clarice Finder, MD  REFERRING PROVIDER: Corlis Pagan NP  REFERRING DIAG: B LE  weakness  THERAPY DIAG:  Difficulty in walking, not elsewhere classified  Balance problem  Leg weakness, bilateral  Rationale for Evaluation and Treatment: Rehabilitation  ONSET DATE: 3 months No SUBJECTIVE:   SUBJECTIVE STATEMENT:  Back ache, don't feel steady on her feet    PERTINENT HISTORY:    Hx of breast cancer and back surgery   PAIN:  Are you having pain?  5/10 Both leg back  PRECAUTIONS: Fall  RED FLAGS: None   WEIGHT BEARING RESTRICTIONS: No  FALLS:  Has patient fallen in last 6 months? No  LIVING ENVIRONMENT: Lives with: lives with their family Lives in: House/apartment Stairs: Yes: External: 2 steps; on right going up Has following equipment at home: Single point cane  OCCUPATION: retired  PLOF: Independent  PATIENT GOALS: to get more stable, avoid falling, be able to get up easier out of chair  NEXT MD VISIT: end of May  OBJECTIVE:  Note: Objective measures were completed at Evaluation unless otherwise noted.  DIAGNOSTIC FINDINGS: na   COGNITION: Overall cognitive status: Within functional limits for tasks assessed     SENSATION: WFL  EDEMA: B ankles mod edema, no discoloration noted      POSTURE: mild flexion B hips and knees in standing  PALPATION: na  LOWER EXTREMITY MNF:hmnddob wfl   LOWER EXTREMITY MMT:  MMT Right eval Left eval Right 06/10/24 Left 06/10/24 Right 07/01/24 Left 07/01/24 Right  07/23/24 Left  07/23/24  Hip flexion 4- 4- 4 4 4+ 4 5 4+  Hip extension          Hip abduction 3+ 3+ 4+ 4+ 5 5 5 5   Hip adduction          Hip internal rotation          Hip external rotation          Knee flexion          Knee extension 3+ 3+ 4 4 5  4+ 5 4+  Ankle dorsiflexion 3+ 3+ 4+ 4+ 5 5 5 5   Ankle plantarflexion 4 4 4+ 4+      Ankle inversion          Ankle eversion           (Blank rows = not tested)   FUNCTIONAL TESTS:  30 seconds chair stand test 10 Timed up and go (TUG): 13.38  FGA 20/30  GAIT: Distance  walked: in clinic up to 50' Assistive device utilized: Single point cane and Grab bars Level of assistance: Modified independence Comments: some increased deviation outside of path                                                                                                                                TREATMENT DATE:  07/30/24 Bike L 3 x 6 min Stair training alt pattern with rails 4 & 6 inch  Sit to stand LE on mat table 2x10  some UE assist Side step on and off airex 2x10  Shoulder Ext 5lb 2x10 On airex ball toss needs CGA/MinA On airex cone toe touch CGA/MinA HS curls 20lb 2x10  07/23/24 Bike L2.5 x 4 min Goals   MMT   20# resisted gait fwd and backward CGA 6in step ups x10 each  07/09/24 Nustep level 5 x 6 minutes Side stepping Direction changes caused some dizziness On airex ball toss needs CGA/MinA On airex cone toe touch CGA/MinA Stairs step over step  20# resisted gait fwd and backward CGA STS with yellow weighted ball Walking ball toss Ball kicks  07/01/24 NuStep L5 x6 min GOALS   MMT  6 min walk test  Sit to stand holding red ball 2x10 Hs curls 25lb 2x10 Leg Ext 5lb 2x10  06/24/24 NuStep L4 x 7 min 6in step ups x10 Hs curls 25lb 2x10 Leg Ext 5lb 2x10 FGA 25/30 Sit to stand holding red ball 2x10  06/17/24 NuStep L4 x 7 min Hs curls 20lb 2x10 Leg Ext 5lb 2x10 4in lateal step ups x10 each CGA  Sit to stand holding red ball 2x10 Hip abd  green 2x10 Hip add ball squeeze 2x10  06/10/24 NuStep L 4 x 7 min Goals   MMT  30 sec sit to stand Hs curls 20lb 2x10 Leg Ext 5lb 2x10 6in Step  ups 2x5 some UE use  Leg press 20lb 2x10  05/27/24 Nustep level 5 x 6 min Sit to stand 2x10 some UE assist  LAQ 2lb 2x10 Standing hip abd 2lb x10 each HS curls green 2x10 Seated Hip Abd green 2x10 Hip add ball squeeze 2x10  05/21/24:  Nustep level 5 x 6 min Long arc quads 1 1/2 # 10 x 2 each  Seated hip abd/ER green t band 15 x 1 Seated red t band ankle pumps  12 x each leg  Sit to stand from elevated mat 5 x 2 sets Red t band hamstring curls 12 x 2  Standing in ll bars for semi tandem standing, 15 sec each foot  PATIENT EDUCATION:  Education details: POC, goals Person educated: Patient Education method: Explanation, Demonstration, Tactile cues, and Verbal cues Education comprehension: verbalized understanding  HOME EXERCISE PROGRAM: TBDAccess Code: [email or web address] URL: [email or web address] Date: 06/10/2024 Prepared by: Tanda Sorrow  Exercises - Supine Bridge  - 1 x daily - 7 x weekly - 3 sets - 10 reps - Sit to Stand  - 1 x daily - 7 x weekly - 3 sets - 10 reps  ASSESSMENT:  CLINICAL IMPRESSION: Pt enters with reports of LE fatigue. Pt has chronic venus insuffiencey, reporting how difficult it is to don her compression stockings. Cues needed for sequencing with stair training.  instability with all airex interventions, encouragement needed to complete. Again greatly encouraged her to be mote active at home.   EVAL: Patient is an 83 y.o. female who was evaluate today by physical therapy  for recent onset of lower extremity weakness.  Noted B quads weakness primarily, actually scored well with TUG and 30 sec sit to stand, but score on FGA indicates high fall risk.  Noted loss of balance especially with horizontal head turns.  She should benefit from skilled PT to address her deficits, she does not participate in any formal exercise routine, so initially she should improve her strength and endurance, balance with structured cardiovascular training and B LE strengthening.  She will be obtaining lab work in a few weeks and then follow up with her MD to determine whether there are any other underlying conditions contributing to her recent onset of weakness.   OBJECTIVE IMPAIRMENTS: decreased activity tolerance, decreased balance, decreased endurance, difficulty walking, and decreased strength.   ACTIVITY LIMITATIONS: carrying, lifting,  standing, squatting, stairs, and transfers  PARTICIPATION LIMITATIONS: cleaning, laundry, shopping, and community activity  PERSONAL FACTORS: Age, Behavior pattern, Fitness, Past/current experiences, Time since onset of injury/illness/exacerbation, and 1-2 comorbidities: h/o breast Ca, HTN, H/o TIA are also affecting patient's functional outcome.   REHAB POTENTIAL: Good  CLINICAL DECISION MAKING: Evolving/moderate complexity  EVALUATION COMPLEXITY: Moderate   GOALS: Goals reviewed with patient? Yes  SHORT TERM GOALS: Target date: 2 weeks, 05/07/24 I HEP Baseline: Goal status: Met 06/17/24   LONG TERM GOALS: Target date: 12 weeks, 07/16/24  Improve FGA from 20/30 to 27/30 Baseline:  Goal status: Progressing 25/30 07/09/24  2.  Improve MMT B quads to 5/5 for improved ease with sit to stand movement Baseline:  Goal status: Progressing 06/10/24, Progressing 07/01/24, Progressing 07/23/24  3.  Improve 30 sec sit to stand from 10 reps to 13 reps  Baseline:  Goal status: Met 06/10/24  4.  Will complete 864ft in 6 min walk test no rest breaks Baseline: 779ft with 1 rest break, got lightheaded and needed to sit Goal status: ongoing 06/24/24, Met  836ft in 3:57 min 07/01/24  PLAN:  PT FREQUENCY: 2x/week  PT DURATION: 12 weeks  PLANNED INTERVENTIONS: 97110-Therapeutic exercises, 97530- Therapeutic activity, [id number]- Neuromuscular re-education, 97535- Self Care, and 02859- Manual therapy  PLAN FOR NEXT SESSION: address balance and leg weakness  Tanda Sorrow, PTA 07/30/24 3:16 PM

## 2024-08-05 ENCOUNTER — Encounter: Payer: Self-pay | Admitting: Physical Therapy

## 2024-08-05 ENCOUNTER — Ambulatory Visit: Admitting: Physical Therapy

## 2024-08-05 DIAGNOSIS — R2689 Other abnormalities of gait and mobility: Secondary | ICD-10-CM | POA: Diagnosis not present

## 2024-08-05 DIAGNOSIS — R262 Difficulty in walking, not elsewhere classified: Secondary | ICD-10-CM

## 2024-08-05 DIAGNOSIS — R29898 Other symptoms and signs involving the musculoskeletal system: Secondary | ICD-10-CM | POA: Diagnosis not present

## 2024-08-05 NOTE — Therapy (Signed)
 OUTPATIENT PHYSICAL THERAPY LOWER EXTREMITY TREATMENT   Patient Name: Tina Ellis MRN: [id number] DOB:1941/07/04, 83 y.o., female Today's Date: 08/05/2024  END OF SESSION:  PT End of Session - 08/05/24 1523     Visit Number 14    Date for PT Re-Evaluation 08/24/24    PT Start Time 1523    PT Stop Time 1600    PT Time Calculation (min) 37 min    Activity Tolerance Patient tolerated treatment well    Behavior During Therapy [facility] for tasks assessed/performed             Past Medical History:  Diagnosis Date   Family history of breast cancer    Family history of colon cancer    Family history of lung cancer    GERD (gastroesophageal reflux disease)    Hypercholesteremia    Hypertension    left breast ca 06/2021   Personal history of chemotherapy    pt had one treatment   Personal history of radiation therapy    Port-A-Cath in place 07/21/2021   Seasonal allergies    TIA (transient ischemic attack)    Past Surgical History:  Procedure Laterality Date   BACK SURGERY     x 2   CHOLECYSTECTOMY     MASTECTOMY     PARTIAL HYSTERECTOMY     Patient Active Problem List   Diagnosis Date Noted   Genetic testing 09/06/2021   Family history of colon cancer 07/18/2021   Family history of breast cancer 07/18/2021   Family history of lung cancer 07/18/2021   Malignant neoplasm of upper-outer quadrant of left breast in female, estrogen receptor negative (HCC) 07/12/2021   Elevated troponin 04/09/2017   TIA (transient ischemic attack) 04/08/2017   Abdominal pain    Stroke-like symptom 04/06/2017   HTN (hypertension) 04/06/2017   GERD (gastroesophageal reflux disease) 04/06/2017   Influenza A 12/15/2013   Hypotension 12/14/2013   Acute renal failure (HCC) 12/14/2013   Lower urinary tract infectious disease 12/13/2013   Hypotension, unspecified 12/13/2013   Dehydration 12/13/2013    PCP: Clarice Finder, MD  REFERRING PROVIDER: Corlis Pagan NP  REFERRING DIAG: B LE  weakness  THERAPY DIAG:  Difficulty in walking, not elsewhere classified  Balance problem  Leg weakness, bilateral  Rationale for Evaluation and Treatment: Rehabilitation  ONSET DATE: 3 months No SUBJECTIVE:   SUBJECTIVE STATEMENT:  My blood pressure is low wasn't feeling well so she decided to check her BP 115/67 66   PERTINENT HISTORY:    Hx of breast cancer and back surgery   PAIN:  Are you having pain?  5/10 Both leg back  PRECAUTIONS: Fall  RED FLAGS: None   WEIGHT BEARING RESTRICTIONS: No  FALLS:  Has patient fallen in last 6 months? No  LIVING ENVIRONMENT: Lives with: lives with their family Lives in: House/apartment Stairs: Yes: External: 2 steps; on right going up Has following equipment at home: Single point cane  OCCUPATION: retired  PLOF: Independent  PATIENT GOALS: to get more stable, avoid falling, be able to get up easier out of chair  NEXT MD VISIT: end of May  OBJECTIVE:  Note: Objective measures were completed at Evaluation unless otherwise noted.  DIAGNOSTIC FINDINGS: na   COGNITION: Overall cognitive status: Within functional limits for tasks assessed     SENSATION: WFL  EDEMA: B ankles mod edema, no discoloration noted      POSTURE: mild flexion B hips and knees in standing  PALPATION: na  LOWER EXTREMITY  MNF:hmnddob wfl   LOWER EXTREMITY MMT:  MMT Right eval Left eval Right 06/10/24 Left 06/10/24 Right 07/01/24 Left 07/01/24 Right  07/23/24 Left  07/23/24  Hip flexion 4- 4- 4 4 4+ 4 5 4+  Hip extension          Hip abduction 3+ 3+ 4+ 4+ 5 5 5 5   Hip adduction          Hip internal rotation          Hip external rotation          Knee flexion          Knee extension 3+ 3+ 4 4 5  4+ 5 4+  Ankle dorsiflexion 3+ 3+ 4+ 4+ 5 5 5 5   Ankle plantarflexion 4 4 4+ 4+      Ankle inversion          Ankle eversion           (Blank rows = not tested)   FUNCTIONAL TESTS:  30 seconds chair stand test 10 Timed up and go  (TUG): 13.38  FGA 20/30  GAIT: Distance walked: in clinic up to 50' Assistive device utilized: Single point cane and Grab bars Level of assistance: Modified independence Comments: some increased deviation outside of path                                                                                                                                TREATMENT DATE:  08/05/24 BP 118/72 NuStep L5 x 6 min Gait around back building, one seated rest due to fatigue Hs curls 20lb 2x15 Leg Ext 5lb 2x15 Sit to stand x 10  07/30/24 Bike L 3 x 6 min Stair training alt pattern with rails 4 & 6 inch  Sit to stand LE on mat table 2x10  some UE assist Side step on and off airex 2x10  Shoulder Ext 5lb 2x10 On airex ball toss needs CGA/MinA On airex cone toe touch CGA/MinA HS curls 20lb 2x10  07/23/24 Bike L2.5 x 4 min Goals   MMT   20# resisted gait fwd and backward CGA 6in step ups x10 each  07/09/24 Nustep level 5 x 6 minutes Side stepping Direction changes caused some dizziness On airex ball toss needs CGA/MinA On airex cone toe touch CGA/MinA Stairs step over step  20# resisted gait fwd and backward CGA STS with yellow weighted ball Walking ball toss Ball kicks  07/01/24 NuStep L5 x6 min GOALS   MMT  6 min walk test  Sit to stand holding red ball 2x10 Hs curls 25lb 2x10 Leg Ext 5lb 2x10  06/24/24 NuStep L4 x 7 min 6in step ups x10 Hs curls 25lb 2x10 Leg Ext 5lb 2x10 FGA 25/30 Sit to stand holding red ball 2x10  06/17/24 NuStep L4 x 7 min Hs curls 20lb 2x10 Leg Ext 5lb 2x10 4in lateal step ups x10 each CGA  Sit to stand holding red  ball 2x10 Hip abd  green 2x10 Hip add ball squeeze 2x10  06/10/24 NuStep L 4 x 7 min Goals   MMT  30 sec sit to stand Hs curls 20lb 2x10 Leg Ext 5lb 2x10 6in Step ups 2x5 some UE use  Leg press 20lb 2x10  PATIENT EDUCATION:  Education details: POC, goals Person educated: Patient Education method: Explanation, Demonstration,  Tactile cues, and Verbal cues Education comprehension: verbalized understanding  HOME EXERCISE PROGRAM: TBDAccess Code: [email or web address] URL: [email or web address] Date: 06/10/2024 Prepared by: Tanda Sorrow  Exercises - Supine Bridge  - 1 x daily - 7 x weekly - 3 sets - 10 reps - Sit to Stand  - 1 x daily - 7 x weekly - 3 sets - 10 reps  ASSESSMENT:  CLINICAL IMPRESSION: 5 min late, Pt enters with reports of fatigue due to low BP. BP taken and is within normal range. Encouragement needed due to repots fatigue. Progressed to outdoor ambulation up an down slope. Increase repots tolerated with curls and ext. Again greatly encouraged her to be mote active at home.   EVAL: Patient is an 83 y.o. female who was evaluate today by physical therapy  for recent onset of lower extremity weakness.  Noted B quads weakness primarily, actually scored well with TUG and 30 sec sit to stand, but score on FGA indicates high fall risk.  Noted loss of balance especially with horizontal head turns.  She should benefit from skilled PT to address her deficits, she does not participate in any formal exercise routine, so initially she should improve her strength and endurance, balance with structured cardiovascular training and B LE strengthening.  She will be obtaining lab work in a few weeks and then follow up with her MD to determine whether there are any other underlying conditions contributing to her recent onset of weakness.   OBJECTIVE IMPAIRMENTS: decreased activity tolerance, decreased balance, decreased endurance, difficulty walking, and decreased strength.   ACTIVITY LIMITATIONS: carrying, lifting, standing, squatting, stairs, and transfers  PARTICIPATION LIMITATIONS: cleaning, laundry, shopping, and community activity  PERSONAL FACTORS: Age, Behavior pattern, Fitness, Past/current experiences, Time since onset of injury/illness/exacerbation, and 1-2 comorbidities: h/o breast Ca, HTN, H/o TIA are  also affecting patient's functional outcome.   REHAB POTENTIAL: Good  CLINICAL DECISION MAKING: Evolving/moderate complexity  EVALUATION COMPLEXITY: Moderate   GOALS: Goals reviewed with patient? Yes  SHORT TERM GOALS: Target date: 2 weeks, 05/07/24 I HEP Baseline: Goal status: Met 06/17/24   LONG TERM GOALS: Target date: 12 weeks, 07/16/24  Improve FGA from 20/30 to 27/30 Baseline:  Goal status: Progressing 25/30 07/09/24  2.  Improve MMT B quads to 5/5 for improved ease with sit to stand movement Baseline:  Goal status: Progressing 06/10/24, Progressing 07/01/24, Progressing 07/23/24  3.  Improve 30 sec sit to stand from 10 reps to 13 reps  Baseline:  Goal status: Met 06/10/24  4.  Will complete 814ft in 6 min walk test no rest breaks Baseline: 712ft with 1 rest break, got lightheaded and needed to sit Goal status: ongoing 06/24/24, Met  858ft in 3:57 min 07/01/24   PLAN:  PT FREQUENCY: 2x/week  PT DURATION: 12 weeks  PLANNED INTERVENTIONS: 97110-Therapeutic exercises, 97530- Therapeutic activity, 97112- Neuromuscular re-education, 97535- Self Care, and [id number]- Manual therapy  PLAN FOR NEXT SESSION: address balance and leg weakness  Tanda Sorrow, PTA 08/05/24 3:23 PM

## 2024-08-12 ENCOUNTER — Ambulatory Visit: Admitting: Physical Therapy

## 2024-08-19 ENCOUNTER — Encounter: Admitting: Physical Therapy

## 2024-08-20 ENCOUNTER — Ambulatory Visit: Admitting: Physical Therapy

## 2024-08-20 ENCOUNTER — Encounter: Payer: Self-pay | Admitting: Physical Therapy

## 2024-08-20 DIAGNOSIS — R2689 Other abnormalities of gait and mobility: Secondary | ICD-10-CM | POA: Diagnosis not present

## 2024-08-20 DIAGNOSIS — R29898 Other symptoms and signs involving the musculoskeletal system: Secondary | ICD-10-CM

## 2024-08-20 DIAGNOSIS — R262 Difficulty in walking, not elsewhere classified: Secondary | ICD-10-CM

## 2024-08-20 NOTE — Therapy (Signed)
 OUTPATIENT PHYSICAL THERAPY LOWER EXTREMITY TREATMENT   Patient Name: Tina Ellis MRN: [id number] DOB:Aug 29, 1941, 83 y.o., female Today's Date: 08/20/2024  END OF SESSION:  PT End of Session - 08/20/24 1055     Visit Number 15    Date for PT Re-Evaluation 08/24/24    PT Start Time 1100    PT Stop Time 1145    PT Time Calculation (min) 45 min    Activity Tolerance Patient tolerated treatment well    Behavior During Therapy [facility] for tasks assessed/performed             Past Medical History:  Diagnosis Date   Family history of breast cancer    Family history of colon cancer    Family history of lung cancer    GERD (gastroesophageal reflux disease)    Hypercholesteremia    Hypertension    left breast ca 06/2021   Personal history of chemotherapy    pt had one treatment   Personal history of radiation therapy    Port-A-Cath in place 07/21/2021   Seasonal allergies    TIA (transient ischemic attack)    Past Surgical History:  Procedure Laterality Date   BACK SURGERY     x 2   CHOLECYSTECTOMY     MASTECTOMY     PARTIAL HYSTERECTOMY     Patient Active Problem List   Diagnosis Date Noted   Genetic testing 09/06/2021   Family history of colon cancer 07/18/2021   Family history of breast cancer 07/18/2021   Family history of lung cancer 07/18/2021   Malignant neoplasm of upper-outer quadrant of left breast in female, estrogen receptor negative (HCC) 07/12/2021   Elevated troponin 04/09/2017   TIA (transient ischemic attack) 04/08/2017   Abdominal pain    Stroke-like symptom 04/06/2017   HTN (hypertension) 04/06/2017   GERD (gastroesophageal reflux disease) 04/06/2017   Influenza A 12/15/2013   Hypotension 12/14/2013   Acute renal failure (HCC) 12/14/2013   Lower urinary tract infectious disease 12/13/2013   Hypotension, unspecified 12/13/2013   Dehydration 12/13/2013    PCP: Clarice Finder, MD  REFERRING PROVIDER: Corlis Pagan NP  REFERRING DIAG: B LE  weakness  THERAPY DIAG:  Difficulty in walking, not elsewhere classified  Balance problem  Leg weakness, bilateral  Rationale for Evaluation and Treatment: Rehabilitation  ONSET DATE: 3 months No SUBJECTIVE:   SUBJECTIVE STATEMENT:  Just tired    PERTINENT HISTORY:    Hx of breast cancer and back surgery   PAIN:  Are you having pain?  7/10 back  PRECAUTIONS: Fall  RED FLAGS: None   WEIGHT BEARING RESTRICTIONS: No  FALLS:  Has patient fallen in last 6 months? No  LIVING ENVIRONMENT: Lives with: lives with their family Lives in: House/apartment Stairs: Yes: External: 2 steps; on right going up Has following equipment at home: Single point cane  OCCUPATION: retired  PLOF: Independent  PATIENT GOALS: to get more stable, avoid falling, be able to get up easier out of chair  NEXT MD VISIT: end of May  OBJECTIVE:  Note: Objective measures were completed at Evaluation unless otherwise noted.  DIAGNOSTIC FINDINGS: na   COGNITION: Overall cognitive status: Within functional limits for tasks assessed     SENSATION: WFL  EDEMA: B ankles mod edema, no discoloration noted      POSTURE: mild flexion B hips and knees in standing  PALPATION: na  LOWER EXTREMITY MNF:hmnddob wfl   LOWER EXTREMITY MMT:  MMT Right eval Left eval Right 06/10/24 Left  06/10/24 Right 07/01/24 Left 07/01/24 Right  07/23/24 Left  07/23/24 Right 08/21/24 Left 08/20/24  Hip flexion 4- 4- 4 4 4+ 4 5 4+    Hip extension            Hip abduction 3+ 3+ 4+ 4+ 5 5 5 5     Hip adduction            Hip internal rotation            Hip external rotation            Knee flexion            Knee extension 3+ 3+ 4 4 5  4+ 5 4+ 5 5  Ankle dorsiflexion 3+ 3+ 4+ 4+ 5 5 5 5 5 5   Ankle plantarflexion 4 4 4+ 4+        Ankle inversion            Ankle eversion             (Blank rows = not tested)   FUNCTIONAL TESTS:  30 seconds chair stand test 10 Timed up and go (TUG): 13.38  FGA  20/30  GAIT: Distance walked: in clinic up to 50' Assistive device utilized: Single point cane and Grab bars Level of assistance: Modified independence Comments: some increased deviation outside of path                                                                                                                                TREATMENT DATE:  08/20/24 NuStep L5 x 7 min Hs curls 20lb 2x15 Leg Ext 5lb 2x15 Sit to stands 2x10 6in step ups x10 each  08/05/24 BP 118/72 NuStep L5 x 6 min Gait around back building, one seated rest due to fatigue Hs curls 20lb 2x15 Leg Ext 5lb 2x15 Sit to stand x 10  07/30/24 Bike L 3 x 6 min Stair training alt pattern with rails 4 & 6 inch  Sit to stand LE on mat table 2x10  some UE assist Side step on and off airex 2x10  Shoulder Ext 5lb 2x10 On airex ball toss needs CGA/MinA On airex cone toe touch CGA/MinA HS curls 20lb 2x10  07/23/24 Bike L2.5 x 4 min Goals   MMT   20# resisted gait fwd and backward CGA 6in step ups x10 each  07/09/24 Nustep level 5 x 6 minutes Side stepping Direction changes caused some dizziness On airex ball toss needs CGA/MinA On airex cone toe touch CGA/MinA Stairs step over step  20# resisted gait fwd and backward CGA STS with yellow weighted ball Walking ball toss Ball kicks  07/01/24 NuStep L5 x6 min GOALS   MMT  6 min walk test  Sit to stand holding red ball 2x10 Hs curls 25lb 2x10 Leg Ext 5lb 2x10  06/24/24 NuStep L4 x 7 min 6in step ups x10 Hs curls 25lb 2x10 Leg Ext 5lb 2x10 FGA  25/30 Sit to stand holding red ball 2x10  06/17/24 NuStep L4 x 7 min Hs curls 20lb 2x10 Leg Ext 5lb 2x10 4in lateal step ups x10 each CGA  Sit to stand holding red ball 2x10 Hip abd  green 2x10 Hip add ball squeeze 2x10  06/10/24 NuStep L 4 x 7 min Goals   MMT  30 sec sit to stand Hs curls 20lb 2x10 Leg Ext 5lb 2x10 6in Step ups 2x5 some UE use  Leg press 20lb 2x10  PATIENT EDUCATION:  Education  details: POC, goals Person educated: Patient Education method: Explanation, Demonstration, Tactile cues, and Verbal cues Education comprehension: verbalized understanding  HOME EXERCISE PROGRAM: TBDAccess Code: [email or web address] URL: [email or web address] Date: 06/10/2024 Prepared by: Tanda Sorrow  Exercises - Supine Bridge  - 1 x daily - 7 x weekly - 3 sets - 10 reps - Sit to Stand  - 1 x daily - 7 x weekly - 3 sets - 10 reps  ASSESSMENT:  CLINICAL IMPRESSION: Pt enters with reports of fatigue. Most goals met  all interventions completed well. Encouragement needed due to repots fatigue. She has progressed increasing her LE strength. She reports being pleased with her current functional status.   EVAL: Patient is an 83 y.o. female who was evaluate today by physical therapy  for recent onset of lower extremity weakness.  Noted B quads weakness primarily, actually scored well with TUG and 30 sec sit to stand, but score on FGA indicates high fall risk.  Noted loss of balance especially with horizontal head turns.  She should benefit from skilled PT to address her deficits, she does not participate in any formal exercise routine, so initially she should improve her strength and endurance, balance with structured cardiovascular training and B LE strengthening.  She will be obtaining lab work in a few weeks and then follow up with her MD to determine whether there are any other underlying conditions contributing to her recent onset of weakness.   OBJECTIVE IMPAIRMENTS: decreased activity tolerance, decreased balance, decreased endurance, difficulty walking, and decreased strength.   ACTIVITY LIMITATIONS: carrying, lifting, standing, squatting, stairs, and transfers  PARTICIPATION LIMITATIONS: cleaning, laundry, shopping, and community activity  PERSONAL FACTORS: Age, Behavior pattern, Fitness, Past/current experiences, Time since onset of injury/illness/exacerbation, and 1-2  comorbidities: h/o breast Ca, HTN, H/o TIA are also affecting patient's functional outcome.   REHAB POTENTIAL: Good  CLINICAL DECISION MAKING: Evolving/moderate complexity  EVALUATION COMPLEXITY: Moderate   GOALS: Goals reviewed with patient? Yes  SHORT TERM GOALS: Target date: 2 weeks, 05/07/24 I HEP Baseline: Goal status: Met 06/17/24   LONG TERM GOALS: Target date: 12 weeks, 07/16/24  Improve FGA from 20/30 to 27/30 Baseline:  Goal status: Progressing 25/30 07/09/24, 25/30 08/20/24  2.  Improve MMT B quads to 5/5 for improved ease with sit to stand movement Baseline:  Goal status: Progressing 06/10/24, Progressing 07/01/24, Progressing 07/23/24, Met 08/20/24  3.  Improve 30 sec sit to stand from 10 reps to 13 reps  Baseline:  Goal status: Met 06/10/24  4.  Will complete 823ft in 6 min walk test no rest breaks Baseline: 750ft with 1 rest break, got lightheaded and needed to sit Goal status: ongoing 06/24/24, Met  834ft in 3:57 min 07/01/24   PLAN:  PT FREQUENCY: 2x/week  PT DURATION: 12 weeks  PLANNED INTERVENTIONS: 97110-Therapeutic exercises, 97530- Therapeutic activity, 97112- Neuromuscular re-education, 97535- Self Care, and 02859- Manual therapy  PLAN FOR NEXT SESSION: D/C PT  PHYSICAL THERAPY DISCHARGE SUMMARY  Visits  from Start of Care: 15   Patient agrees to discharge. Patient goals were partially met. Patient is being discharged due to being pleased with the current functional level.   Tanda Sorrow, PTA 08/20/24 10:56 AM

## 2024-08-26 ENCOUNTER — Ambulatory Visit: Admitting: Physical Therapy

## 2024-08-26 ENCOUNTER — Encounter (HOSPITAL_COMMUNITY)

## 2024-08-26 ENCOUNTER — Encounter: Admitting: Vascular Surgery

## 2024-09-05 DIAGNOSIS — G629 Polyneuropathy, unspecified: Secondary | ICD-10-CM | POA: Diagnosis not present

## 2024-09-05 DIAGNOSIS — I8390 Asymptomatic varicose veins of unspecified lower extremity: Secondary | ICD-10-CM | POA: Diagnosis not present

## 2024-09-11 ENCOUNTER — Encounter: Payer: Self-pay | Admitting: Internal Medicine

## 2024-10-28 ENCOUNTER — Ambulatory Visit
Admission: EM | Admit: 2024-10-28 | Discharge: 2024-10-28 | Disposition: A | Discharge status: Home / Self Care | Attending: Emergency Medicine | Admitting: Emergency Medicine

## 2024-10-28 DIAGNOSIS — R0982 Postnasal drip: Secondary | ICD-10-CM

## 2024-10-28 DIAGNOSIS — R490 Dysphonia: Secondary | ICD-10-CM

## 2024-10-28 DIAGNOSIS — R350 Frequency of micturition: Secondary | ICD-10-CM

## 2024-10-28 LAB — POCT URINE DIPSTICK
Glucose, UA: NEGATIVE mg/dL
Nitrite, UA: NEGATIVE
Spec Grav, UA: 1.025 (ref 1.010–1.025)
Urobilinogen, UA: 1 U/dL
pH, UA: 5.5 (ref 5.0–8.0)

## 2024-10-28 LAB — POC COVID19/FLU A&B COMBO
Covid Antigen, POC: NEGATIVE
Influenza A Antigen, POC: NEGATIVE
Influenza B Antigen, POC: NEGATIVE

## 2024-10-28 NOTE — ED Triage Notes (Addendum)
 Pt c/o cough and loss of voice  for 1 day.   Pt has also been having to got to the bathroom more frequently last 1-2days

## 2024-10-28 NOTE — ED Provider Notes (Signed)
 [facility]    CSN: [id number] Arrival date & time: 10/28/24  1032      History   Chief Complaint Chief Complaint  Patient presents with   Cough    HPI Tina Ellis is a 83 y.o. female. She has 2 concerns.   First, she has a hoarse voice and post nasal drainage. Denies feeling sick. Denies nasal congestion, headache, ear pain, sore throat, cough. Thinks maybe her pug puppy is bringing in allergens from outside. Does want to make sure she doesn't have covid or flu.   Second, she has increased urinary frequency in last 2 days. Denies abd pain, back pain, n/v. Wants urine checked.   Lastly, she c/o varicose veins and vein dr suggeested compression socks but she doesn't like them.    Cough   Past Medical History:  Diagnosis Date   Family history of breast cancer    Family history of colon cancer    Family history of lung cancer    GERD (gastroesophageal reflux disease)    Hypercholesteremia    Hypertension    left breast ca 06/2021   Personal history of chemotherapy    pt had one treatment   Personal history of radiation therapy    Port-A-Cath in place 07/21/2021   Seasonal allergies    TIA (transient ischemic attack)     Patient Active Problem List   Diagnosis Date Noted   Genetic testing 09/06/2021   Family history of colon cancer 07/18/2021   Family history of breast cancer 07/18/2021   Family history of lung cancer 07/18/2021   Malignant neoplasm of upper-outer quadrant of left breast in female, estrogen receptor negative (HCC) 07/12/2021   Elevated troponin 04/09/2017   TIA (transient ischemic attack) 04/08/2017   Abdominal pain    Stroke-like symptom 04/06/2017   HTN (hypertension) 04/06/2017   GERD (gastroesophageal reflux disease) 04/06/2017   Influenza A 12/15/2013   Hypotension 12/14/2013   Acute renal failure 12/14/2013   Lower urinary tract infectious disease 12/13/2013   Hypotension, unspecified 12/13/2013   Dehydration  12/13/2013    Past Surgical History:  Procedure Laterality Date   BACK SURGERY     x 2   CHOLECYSTECTOMY     MASTECTOMY     PARTIAL HYSTERECTOMY      OB History   No obstetric history on file.      Home Medications    Prior to Admission medications   Medication Sig Start Date End Date Taking? Authorizing Provider  amLODipine  (NORVASC ) 2.5 MG tablet Take 2.5 mg by mouth daily. 06/30/21   [provider]  aspirin  EC 81 MG EC tablet Take 1 tablet (81 mg total) by mouth daily. 07/12/21   Gudena, Vinay, MD  carvedilol  (COREG ) 12.5 MG tablet Take 1 tablet (12.5 mg total) by mouth 2 (two) times daily with a meal. Patient taking differently: Take 3.125 mg by mouth 2 (two) times daily with a meal. 04/10/17   Dolan Mateo Larger, MD  cefdinir  (OMNICEF ) 300 MG capsule Take 1 capsule (300 mg total) by mouth 2 (two) times daily. 06/12/23   Small, Brooke L, PA  cetirizine (ZYRTEC) 10 MG tablet Take 10 mg by mouth at bedtime.    [provider]  cholecalciferol (VITAMIN D ) 1000 UNITS tablet Take 2,000 Units by mouth every morning.    [provider]  Cholecalciferol 50 MCG (2000 UT) CAPS 2,000 capsules.    [provider]  fluticasone (FLONASE) 50 MCG/ACT nasal spray Place 1  spray into both nostrils daily. 12/24/23   [provider]  hydrochlorothiazide  (HYDRODIURIL ) 12.5 MG tablet Take 12.5 mg by mouth daily. 1.5 only when it is high 06/30/21   [provider]  losartan  (COZAAR ) 100 MG tablet Take 1 tablet by mouth daily.    [provider]  metroNIDAZOLE  (FLAGYL ) 500 MG tablet Take 1 tablet (500 mg total) by mouth 2 (two) times daily. 06/12/23   Small, Brooke L, PA  omeprazole (PRILOSEC) 20 MG capsule Take 20 mg by mouth every morning.     [provider]  rosuvastatin  (CRESTOR ) 10 MG tablet Take 1 tablet (10 mg total) by mouth every evening. 04/10/17   Dolan Mateo Larger, MD  prochlorperazine  (COMPAZINE ) 10 MG tablet Take 1  tablet (10 mg total) by mouth every 6 (six) hours as needed (Nausea or vomiting). 07/15/21 07/26/21  Odean Potts, MD    Family History Family History  Problem Relation Age of Onset   Colon cancer Mother    Lung cancer Father    Breast cancer Maternal Aunt    Crohn's disease Brother     Social History Social History   Tobacco Use   Smoking status: Never   Smokeless tobacco: Never  Vaping Use   Vaping status: Never Used  Substance Use Topics   Alcohol use: No   Drug use: No     Allergies   Conjugated estrogens, Fish oil, Nitrofurantoin, Benadryl  itch stopping [diphenhydramine  hcl], Other, Pneumococcal 13-val conj vacc, and Pneumococcal 13-val conj vacc   Review of Systems Review of Systems  Respiratory:  Positive for cough.      Physical Exam Triage Vital Signs ED Triage Vitals  Encounter Vitals Group     BP 10/28/24 1047 107/70     Girls Systolic BP Percentile --      Girls Diastolic BP Percentile --      Boys Systolic BP Percentile --      Boys Diastolic BP Percentile --      Pulse Rate 10/28/24 1047 78     Resp 10/28/24 1047 17     Temp 10/28/24 1047 97.6 F (36.4 C)     Temp Source 10/28/24 1047 Oral     SpO2 10/28/24 1047 98 %     Weight --      Height --      Head Circumference --      Peak Flow --      Pain Score 10/28/24 1051 0     Pain Loc --      Pain Education --      Exclude from Growth Chart --    No data found.  Updated Vital Signs BP 107/70 (BP Location: Right Arm)   Pulse 78   Temp 97.6 F (36.4 C) (Oral)   Resp 17   SpO2 98%   Visual Acuity Right Eye Distance:   Left Eye Distance:   Bilateral Distance:    Right Eye Near:   Left Eye Near:    Bilateral Near:     Physical Exam Constitutional:      General: She is not in acute distress.    Appearance: Normal appearance. She is not ill-appearing.  HENT:     Nose: Nose normal.     Mouth/Throat:     Mouth: Mucous membranes are moist.     Pharynx: Oropharynx is clear.   Cardiovascular:     Rate and Rhythm: Normal rate and regular rhythm.  Pulmonary:     Effort: Pulmonary  effort is normal.     Breath sounds: Normal breath sounds.  Lymphadenopathy:     Cervical: No cervical adenopathy.  Neurological:     Mental Status: She is alert.      UC Treatments / Results  Labs (all labs ordered are listed, but only abnormal results are displayed) Labs Reviewed  POCT URINE DIPSTICK - Abnormal; Notable for the following components:      Result Value   Color, UA straw (*)    Clarity, UA cloudy (*)    Bilirubin, UA small (*)    Ketones, POC UA small (15) (*)    Blood, UA small (*)    Leukocytes, UA Trace (*)    All other components within normal limits  URINE CULTURE  POC COVID19/FLU A&B COMBO    EKG   Radiology No results found.  Procedures Procedures (including critical care time)  Medications Ordered in UC Medications - No data to display  Initial Impression / Assessment and Plan / UC Course  I have reviewed the triage vital signs and the nursing notes.  Pertinent labs & imaging results that were available during my care of the patient were reviewed by me and considered in my medical decision making (see chart for details).    Flu/covid neg. Does not appear acutely ill, maybe allergies. Made supportive care recommendations. Rec try lighter compression socks. Urine does not show definitive UTI, but is not completely clear. Will get culture to r/o uti.   Final Clinical Impressions(s) / UC Diagnoses   Final diagnoses:  Urinary frequency  Hoarseness of voice  Post-nasal drainage     Discharge Instructions      We will call you if the urine culture results are abnormal, otherwise we will not call.   For your hoarse voice, I recommend:  Resting your voice Starting taking zyrtec (cetirizine) again while you have symptoms (you can stop it when you are better) Try herbal tea such as chamomile with honey and lemon in it Throat  lozenges   ED Prescriptions   None    PDMP not reviewed this encounter.   Richad Jon HERO, NP 10/28/24 1236

## 2024-10-28 NOTE — Discharge Instructions (Signed)
 We will call you if the urine culture results are abnormal, otherwise we will not call.   For your hoarse voice, I recommend:  Resting your voice Starting taking zyrtec (cetirizine) again while you have symptoms (you can stop it when you are better) Try herbal tea such as chamomile with honey and lemon in it Throat lozenges

## 2024-10-28 NOTE — ED Notes (Signed)
 POCT URINE DIPSTICK -  source: clean catch urine  Accidentally clicked Nasopharyngeal swab when selecting source in orders.

## 2024-10-30 LAB — URINE CULTURE

## 2024-10-31 ENCOUNTER — Ambulatory Visit (HOSPITAL_COMMUNITY): Payer: Self-pay

## 2024-11-11 ENCOUNTER — Telehealth: Payer: Self-pay

## 2024-11-11 NOTE — Telephone Encounter (Signed)
 Called back and reviewed 11/25 appt time. She is aware of appt date/time.

## 2024-11-13 DIAGNOSIS — G629 Polyneuropathy, unspecified: Secondary | ICD-10-CM | POA: Diagnosis not present

## 2024-11-13 DIAGNOSIS — J069 Acute upper respiratory infection, unspecified: Secondary | ICD-10-CM | POA: Diagnosis not present

## 2024-11-13 DIAGNOSIS — Z23 Encounter for immunization: Secondary | ICD-10-CM | POA: Diagnosis not present

## 2024-11-18 ENCOUNTER — Encounter: Payer: Self-pay | Admitting: Adult Health

## 2024-11-18 ENCOUNTER — Inpatient Hospital Stay: Payer: Medicare Other | Attending: Adult Health | Admitting: Adult Health

## 2024-11-18 VITALS — BP 143/63 | HR 66 | Temp 97.8°F | Resp 18 | Ht 66.0 in | Wt 128.8 lb

## 2024-11-18 DIAGNOSIS — Z9012 Acquired absence of left breast and nipple: Secondary | ICD-10-CM | POA: Diagnosis not present

## 2024-11-18 DIAGNOSIS — Z08 Encounter for follow-up examination after completed treatment for malignant neoplasm: Secondary | ICD-10-CM | POA: Diagnosis present

## 2024-11-18 DIAGNOSIS — C50412 Malignant neoplasm of upper-outer quadrant of left female breast: Secondary | ICD-10-CM | POA: Diagnosis not present

## 2024-11-18 DIAGNOSIS — Z853 Personal history of malignant neoplasm of breast: Secondary | ICD-10-CM | POA: Insufficient documentation

## 2024-11-18 DIAGNOSIS — Z171 Estrogen receptor negative status [ER-]: Secondary | ICD-10-CM

## 2024-11-18 NOTE — Progress Notes (Signed)
 [facility] Cancer Follow up:    Tina Nottingham, MD [address] [place] [address]   DIAGNOSIS: Cancer Staging  Malignant neoplasm of upper-outer quadrant of left breast in female, estrogen receptor negative (HCC) Staging form: Breast, AJCC 8th Edition - Clinical: Stage IIIC (cT2, cN2, cM0, G3, ER-, PR-, HER2-) - Signed by Odean Potts, MD on 07/12/2021 Histologic grading system: 3 grade system    SUMMARY OF ONCOLOGIC HISTORY: Oncology History  Malignant neoplasm of upper-outer quadrant of left breast in female, estrogen receptor negative (HCC)  06/23/2021 Initial Diagnosis   Patient felt left axillary lump which led to mammogram and ultrasound that revealed 4.1 cm left breast mass with a 6.7 cm left axillary lymph node.  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  Lymphovascular invasion noted, ER 0%, PR 0%, HER2 negative   07/12/2021 Cancer Staging   Staging form: Breast, AJCC 8th Edition - Clinical: Stage IIIC (cT2, cN2, cM0, G3, ER-, PR-, HER2-) - Signed by Odean Potts, MD on 07/12/2021 Histologic grading system: 3 grade system   07/21/2021 - 07/21/2021 Chemotherapy   1 cycle of Taxol  carboplatin  and Keytruda .  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 The report date is 08/25/2021.  The Multi-Cancer Panel + RNA offered by [facility] includes sequencing and/or deletion duplication testing of the following 84 genes: AIP, ALK, APC, ATM, AXIN2,BAP1,  BARD1, BLM, BMPR1A, BRCA1, BRCA2, BRIP1, CASR, CDC73, CDH1, CDK4, CDKN1B, CDKN1C, CDKN2A (p14ARF), CDKN2A (p16INK4a), CEBPA, CHEK2, CTNNA1, DICER1,  DIS3L2, EGFR (c.2369C>T, p.Thr790Met variant only), EPCAM (Deletion/duplication testing only), FH, FLCN, GATA2, GPC3, GREM1 (Promoter region deletion/duplication testing only), HOXB13 (c.251G>A, p.Gly84Glu), HRAS, KIT, MAX, MEN1, MET, MITF (c.952G>A, p.Glu318Lys variant only), MLH1, MSH2, MSH3, MSH6, MUTYH, NBN, NF1, NF2, NTHL1, PALB2, PDGFRA, PHOX2B, PMS2, POLD1, POLE, POT1, PRKAR1A, PTCH1, PTEN, RAD50, RAD51C, RAD51D, RB1, RECQL4, RET, RUNX1, SDHAF2, SDHA (sequence changes only), SDHB, SDHC, SDHD, SMAD4, SMARCA4, SMARCB1, SMARCE1, STK11, SUFU, TERC, TERT, TMEM127, TP53, TSC1, TSC2, VHL, WRN and WT1.   10/10/2021 - 11/11/2021 Radiation Therapy   Site Technique Total Dose (Gy) Dose per Fx (Gy) Completed Fx Beam Energies  Chest Wall, Left: CW_Lt 3D 50/50 2 25/25 6X  Chest Wall, Left: CW_Lt_SCV_PAB 3D 50/50 2 25/25 6X, 10X       CURRENT THERAPY: Observation  INTERVAL HISTORY:  Discussed the use of AI scribe software for clinical note transcription with the patient, who gave verbal consent to proceed.  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  She has weakness in her legs and has had arterial scans. She is awaiting a neurology evaluation in January. She notes occasional involuntary toe movements but otherwise feels well.  She has difficulty gaining weight, which she relates to emotional stress from the recent loss of her best friend to cancer and the prior loss of her husband.  She faces significant financial strain, with monthly expenses exceeding her income, including her house payment and other bills.  She lives with her  67 year old grandson and has a development worker, international aid. She is active in her church and community.  She has no stomach pain, blood in stool, headaches, or  vision changes.     Patient Active Problem List   Diagnosis Date Noted   Genetic testing 09/06/2021   Family history of colon cancer 07/18/2021   Family history of breast cancer 07/18/2021   Family history of lung cancer 07/18/2021   Malignant neoplasm of upper-outer quadrant of left breast in female, estrogen receptor negative (HCC) 07/12/2021   Elevated troponin 04/09/2017   TIA (transient ischemic attack) 04/08/2017   Abdominal pain    Stroke-like symptom 04/06/2017   HTN (hypertension) 04/06/2017   GERD (gastroesophageal reflux disease) 04/06/2017   Influenza A 12/15/2013   Hypotension 12/14/2013   Acute renal failure 12/14/2013   Lower urinary tract infectious disease 12/13/2013   Hypotension, unspecified 12/13/2013   Dehydration 12/13/2013    is allergic to conjugated estrogens, fish oil, nitrofurantoin, benadryl  itch stopping [diphenhydramine  hcl], other, pneumococcal 13-val conj vacc, and pneumococcal 13-val conj vacc.  MEDICAL HISTORY: Past Medical History:  Diagnosis Date   Family history of breast cancer    Family history of colon cancer    Family history of lung cancer    GERD (gastroesophageal reflux disease)    Hypercholesteremia    Hypertension    left breast ca 06/2021   Personal history of chemotherapy    pt had one treatment   Personal history of radiation therapy    Port-A-Cath in place 07/21/2021   Seasonal allergies    TIA (transient ischemic attack)     SURGICAL HISTORY: Past Surgical History:  Procedure Laterality Date   BACK SURGERY     x 2   CHOLECYSTECTOMY     MASTECTOMY     PARTIAL HYSTERECTOMY      SOCIAL HISTORY: Social History   Socioeconomic History   Marital status: Widowed    Spouse name: Not on file   Number of children: 2   Years of education: HS   Highest education level: Not on file  Occupational History   Occupation: Part-time airline pilot  Tobacco Use   Smoking status: Never   Smokeless tobacco: Never  Vaping Use    Vaping status: Never Used  Substance and Sexual Activity   Alcohol use: No   Drug use: No   Sexual activity: Not Currently  Other Topics Concern   Not on file  Social History Narrative   Lives at home with son and grandson.   Right-handed.   1-2 cups caffeine per day.   Social Drivers of Corporate Investment Banker Strain: Low Risk  (08/24/2021)   Received from [facility]   Overall Financial Resource Strain (CARDIA)    Difficulty of Paying Living Expenses: Not very hard  Food Insecurity: Food Insecurity Present (06/19/2023)   Hunger Vital Sign    Worried About Running Out of Food in the Last Year: Sometimes true    Ran Out of Food in the Last Year: Never true  Transportation Needs: Not on file  Physical Activity: Not on file  Stress: No Stress Concern Present (01/24/2022)   Received from [facility] - Occupational Stress Questionnaire    Feeling of Stress : Only a little  Social Connections: Not on file  Intimate Partner Violence: Not on file    FAMILY HISTORY: Family History  Problem Relation Age of Onset   Colon cancer Mother    Lung cancer  Father    Breast cancer Maternal Aunt    Crohn's disease Brother     Review of Systems  Constitutional:  Negative for appetite change, chills, fatigue, fever and unexpected weight change.  HENT:   Negative for hearing loss, lump/mass and trouble swallowing.   Eyes:  Negative for eye problems and icterus.  Respiratory:  Negative for chest tightness, cough and shortness of breath.   Cardiovascular:  Negative for chest pain, leg swelling and palpitations.  Gastrointestinal:  Negative for abdominal distention, abdominal pain, constipation, diarrhea, nausea and vomiting.  Endocrine: Negative for hot flashes.  Genitourinary:  Negative for difficulty urinating.   Musculoskeletal:  Negative for arthralgias.  Skin:  Negative for itching and rash.  Neurological:  Negative for dizziness,  extremity weakness, headaches and numbness.  Hematological:  Negative for adenopathy. Does not bruise/bleed easily.  Psychiatric/Behavioral:  Negative for depression. The patient is not nervous/anxious.       PHYSICAL EXAMINATION    Vitals:   11/18/24 1119  BP: (!) 143/63  Pulse: 66  Resp: 18  Temp: 97.8 F (36.6 C)  SpO2: 100%    Physical Exam Constitutional:      General: She is not in acute distress.    Appearance: Normal appearance. She is not toxic-appearing.  HENT:     Head: Normocephalic and atraumatic.     Mouth/Throat:     Mouth: Mucous membranes are moist.     Pharynx: Oropharynx is clear. No oropharyngeal exudate or posterior oropharyngeal erythema.  Eyes:     General: No scleral icterus. Cardiovascular:     Rate and Rhythm: Normal rate and regular rhythm.     Pulses: Normal pulses.     Heart sounds: Normal heart sounds.  Pulmonary:     Effort: Pulmonary effort is normal.     Breath sounds: Normal breath sounds.  Chest:     Comments: Left breast s/p mastectomy and radiation, no sign of local recurrence, right breast benign Abdominal:     General: Abdomen is flat. Bowel sounds are normal. There is no distension.     Palpations: Abdomen is soft.     Tenderness: There is no abdominal tenderness.  Musculoskeletal:        General: No swelling.     Cervical back: Neck supple.  Lymphadenopathy:     Cervical: No cervical adenopathy.     Upper Body:     Right upper body: No supraclavicular or axillary adenopathy.     Left upper body: No supraclavicular or axillary adenopathy.  Skin:    General: Skin is warm and dry.     Findings: No rash.  Neurological:     General: No focal deficit present.     Mental Status: She is alert.  Psychiatric:        Mood and Affect: Mood normal.        Behavior: Behavior normal.     LABORATORY DATA:  CBC    Component Value Date/Time   WBC 14.1 (H) 06/12/2023 1545   RBC 4.61 06/12/2023 1545   HGB 14.1 06/12/2023 1545    HGB 13.4 07/26/2021 0950   HCT 42.0 06/12/2023 1545   PLT 254 06/12/2023 1545   PLT 234 07/26/2021 0950   MCV 91.1 06/12/2023 1545   MCH 30.6 06/12/2023 1545   MCHC 33.6 06/12/2023 1545   RDW 13.2 06/12/2023 1545   LYMPHSABS 1.1 06/12/2023 1545   MONOABS 1.3 (H) 06/12/2023 1545   EOSABS 0.0 06/12/2023 1545   BASOSABS 0.0  06/12/2023 1545    CMP     Component Value Date/Time   NA 136 06/12/2023 1545   K 3.3 (L) 06/12/2023 1545   CL 100 06/12/2023 1545   CO2 25 06/12/2023 1545   GLUCOSE 134 (H) 06/12/2023 1545   BUN 18 06/12/2023 1545   CREATININE 0.93 06/12/2023 1545   CREATININE 0.98 07/26/2021 0950   CALCIUM  9.5 06/12/2023 1545   PROT 7.4 06/12/2023 1545   ALBUMIN 3.9 06/12/2023 1545   AST 62 (H) 06/12/2023 1545   AST 20 07/26/2021 0950   ALT 15 06/12/2023 1545   ALT 11 07/26/2021 0950   ALKPHOS 43 06/12/2023 1545   BILITOT 1.0 06/12/2023 1545   BILITOT 1.0 07/26/2021 0950   GFRNONAA >60 06/12/2023 1545   GFRNONAA 59 (L) 07/26/2021 0950   GFRAA >60 04/10/2017 0538     ASSESSMENT and THERAPY PLAN:    Assessment and Plan Assessment & Plan Surveillance for recurrence of triple negative breast cancer, status post chemotherapy, mastectomy, and radiation 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 No symptoms of recurrence. Emphasized surveillance due to aggressive nature of disease. - Recommended Guardant Reveal blood test every six months. - If positive, order CT chest, abdomen, pelvis, and bone scan. - Continue annual oncology follow-up. -Continue annual mammograms of her right breast RTC in 1 year for continued long term surveillance   All questions were answered. The patient knows to call the clinic with any problems, questions or concerns. We can certainly see the patient much sooner if necessary.  Total encounter time:20 minutes*in face-to-face visit time, chart review, lab review, care coordination, order entry, and  documentation of the encounter time.    Morna Kendall, NP 11/18/24 11:41 AM Medical Oncology and Hematology [facility] [address] [place], [place] [address] Tel. [phone]    Fax. [phone]  *Total Encounter Time as defined by the Centers for Medicare and Medicaid Services includes, in addition to the face-to-face time of a patient visit (documented in the note above) non-face-to-face time: obtaining and reviewing outside history, ordering and reviewing medications, tests or procedures, care coordination (communications with other health care professionals or caregivers) and documentation in the medical record.

## 2024-11-25 DIAGNOSIS — E7800 Pure hypercholesterolemia, unspecified: Secondary | ICD-10-CM | POA: Diagnosis not present

## 2024-12-02 DIAGNOSIS — I700 Atherosclerosis of aorta: Secondary | ICD-10-CM | POA: Diagnosis not present

## 2024-12-02 DIAGNOSIS — E559 Vitamin D deficiency, unspecified: Secondary | ICD-10-CM | POA: Diagnosis not present

## 2024-12-02 DIAGNOSIS — I10 Essential (primary) hypertension: Secondary | ICD-10-CM | POA: Diagnosis not present

## 2024-12-02 DIAGNOSIS — E7800 Pure hypercholesterolemia, unspecified: Secondary | ICD-10-CM | POA: Diagnosis not present

## 2024-12-02 DIAGNOSIS — G629 Polyneuropathy, unspecified: Secondary | ICD-10-CM | POA: Diagnosis not present

## 2024-12-02 DIAGNOSIS — K219 Gastro-esophageal reflux disease without esophagitis: Secondary | ICD-10-CM | POA: Diagnosis not present

## 2025-01-01 ENCOUNTER — Encounter: Payer: Self-pay | Admitting: Neurology

## 2025-01-01 ENCOUNTER — Ambulatory Visit: Admitting: Neurology

## 2025-01-01 VITALS — BP 118/76 | HR 63 | Wt 129.2 lb

## 2025-01-01 DIAGNOSIS — R202 Paresthesia of skin: Secondary | ICD-10-CM

## 2025-01-01 DIAGNOSIS — R269 Unspecified abnormalities of gait and mobility: Secondary | ICD-10-CM | POA: Diagnosis not present

## 2025-01-01 NOTE — Progress Notes (Signed)
 "  Chief Complaint  Patient presents with   New Patient (Initial Visit)    Pt in room 15. Alone. proficient paper referral for neuropathic pain.      ASSESSMENT AND PLAN  Tina Ellis is a 84 y.o. female   Bilateral feet paresthesia, unsteady gait History of lumbar decompression surgery  Brisk reflex on examination   Most worried about cervical spondylitic myelopathy, proceeding with MRI cervical spine  Follow-up depend on MRI findings  DIAGNOSTIC DATA (LABS, IMAGING, TESTING) - I reviewed patient records, labs, notes, testing and imaging myself where available.   MEDICAL HISTORY:  Tina Ellis is a 84 year old female, seen in request by her primary care doctor [name], for evaluation of toe paresthesia   History is obtained from the patient and review of electronic medical records. I personally reviewed pertinent available imaging films in PACS.   PMHx of  HTN Hx of Left breast cancer in 2022, s/p lobectomy, radiation,  TIA Lumbar decompression surgery, presenting with low back pain,   Since 2025, she noticed bilateral toes numbness tingling, swelling sensation, sleeping in bed lifting up her leg often helps the symptoms, compression socks was helpful to them, she described toes feel funny burning when she moves them,  She also has mild unsteady gait, again to use cane over the past 1 year, has urinary urgency, denies incontinence, denies upper extremity involvement  Lab in Sep 2025, B12 315, normal TSH 1.44, protein electrophoresis, normal CBC hemoglobin of 14.2, CMP creatinine normal 0.86, LDL 69,  PHYSICAL EXAM:   Vitals:   01/01/25 1549  BP: 118/76  Pulse: 63  SpO2: 99%  Weight: 129 lb 3.2 oz (58.6 kg)   Body mass index is 20.85 kg/m.  PHYSICAL EXAMNIATION:  Gen: NAD, conversant, well nourised, well groomed                     Cardiovascular: Regular rate rhythm, no peripheral edema, warm, nontender. Eyes: Conjunctivae clear without exudates  or hemorrhage Neck: Supple, no carotid bruits. Pulmonary: Clear to auscultation bilaterally   NEUROLOGICAL EXAM:  MENTAL STATUS: Speech/cognition: Awake, alert, oriented to history taking and casual conversation CRANIAL NERVES: CN II: Visual fields are full to confrontation. Pupils are round equal and briskly reactive to light. CN III, IV, VI: extraocular movement are normal. No ptosis. CN V: Facial sensation is intact to light touch CN VII: Face is symmetric with normal eye closure  CN VIII: Hearing is normal to causal conversation. CN IX, X: Phonation is normal. CN XI: Head turning and shoulder shrug are intact  MOTOR: There is no pronator drift of out-stretched arms. Muscle bulk and tone are normal. Muscle strength is normal.  REFLEXES: Reflexes are 2+ and symmetric at the biceps, triceps, knees, and ankles. Plantar responses are flexor.  SENSORY: Intact to light touch, pinprick and vibratory sensation are intact in fingers and toes.  COORDINATION: There is no trunk or limb dysmetria noted.  GAIT/STANCE: Push-up, cautious  REVIEW OF SYSTEMS:  Full 14 system review of systems performed and notable only for as above All other review of systems were negative.   ALLERGIES: Allergies[1]  HOME MEDICATIONS: Current Outpatient Medications  Medication Sig Dispense Refill   acetaminophen  (TYLENOL ) 325 MG tablet Take 650 mg by mouth at bedtime as needed.     amLODipine  (NORVASC ) 2.5 MG tablet Take 2.5 mg by mouth daily.     aspirin  EC 81 MG EC tablet Take 1 tablet (81 mg total) by  mouth daily.     carvedilol  (COREG ) 12.5 MG tablet Take 1 tablet (12.5 mg total) by mouth 2 (two) times daily with a meal. (Patient taking differently: Take 3.125 mg by mouth 2 (two) times daily with a meal.) 60 tablet 0   cholecalciferol (VITAMIN D ) 1000 UNITS tablet Take 2,000 Units by mouth every morning.     fluticasone (FLONASE) 50 MCG/ACT nasal spray Place 1 spray into both nostrils daily.      hydrochlorothiazide  (HYDRODIURIL ) 12.5 MG tablet Take 12.5 mg by mouth daily. 1.5 only when it is high     losartan  (COZAAR ) 100 MG tablet Take 1 tablet by mouth daily.     omeprazole (PRILOSEC) 20 MG capsule Take 20 mg by mouth every morning.      rosuvastatin  (CRESTOR ) 10 MG tablet Take 1 tablet (10 mg total) by mouth every evening. 30 tablet 0   cetirizine (ZYRTEC) 10 MG tablet Take 10 mg by mouth at bedtime. (Patient not taking: Reported on 01/01/2025)     metroNIDAZOLE  (FLAGYL ) 500 MG tablet Take 1 tablet (500 mg total) by mouth 2 (two) times daily. (Patient not taking: Reported on 01/01/2025) 14 tablet 0   No current facility-administered medications for this visit.    PAST MEDICAL HISTORY: Past Medical History:  Diagnosis Date   Family history of breast cancer    Family history of colon cancer    Family history of lung cancer    GERD (gastroesophageal reflux disease)    Hypercholesteremia    Hypertension    left breast ca 06/2021   Personal history of chemotherapy    pt had one treatment   Personal history of radiation therapy    Port-A-Cath in place 07/21/2021   Seasonal allergies    TIA (transient ischemic attack)     PAST SURGICAL HISTORY: Past Surgical History:  Procedure Laterality Date   BACK SURGERY     x 2   CHOLECYSTECTOMY     MASTECTOMY     PARTIAL HYSTERECTOMY      FAMILY HISTORY: Family History  Problem Relation Age of Onset   Colon cancer Mother    Lung cancer Father    Breast cancer Maternal Aunt    Crohn's disease Brother     SOCIAL HISTORY: Social History   Socioeconomic History   Marital status: Widowed    Spouse name: Not on file   Number of children: 2   Years of education: HS   Highest education level: Not on file  Occupational History   Occupation: Part-time airline pilot  Tobacco Use   Smoking status: Never   Smokeless tobacco: Never  Vaping Use   Vaping status: Never Used  Substance and Sexual Activity   Alcohol use: No   Drug use: No    Sexual activity: Not Currently  Other Topics Concern   Not on file  Social History Narrative   Lives at home with son and grandson.   Right-handed.   1-2 cups caffeine per day.   Social Drivers of Health   Tobacco Use: Low Risk (01/01/2025)   Patient History    Smoking Tobacco Use: Never    Smokeless Tobacco Use: Never    Passive Exposure: Not on file  Financial Resource Strain: Not on file  Food Insecurity: Food Insecurity Present (06/19/2023)   Hunger Vital Sign    Worried About Running Out of Food in the Last Year: Sometimes true    Ran Out of Food in the Last Year: Never true  Transportation  Needs: Not on file  Physical Activity: Not on file  Stress: No Stress Concern Present (01/24/2022)   Received from [facility] - Occupational Stress Questionnaire    Feeling of Stress : Only a little  Social Connections: Not on file  Intimate Partner Violence: Not on file  Depression [phone]): Low Risk (11/18/2024)   Depression (PHQ2-9)    PHQ-2 Score: 0  Alcohol Screen: Not on file  Housing: Not on file  Utilities: Not on file  Health Literacy: Not on file      Modena Callander, M.D. Ph.D.  [facility] [address] [place], [place] [address] Ph: [phone] Fax: [phone]  CC:  Royden Ronal Czar, FNP [address] [place],  [place] [address]  [name], MD       [1]  Allergies Allergen Reactions   Conjugated Estrogens Other (See Comments)   Fish Oil Rash   Nitrofurantoin Nausea And Vomiting   Benadryl  Itch Stopping [Diphenhydramine  Hcl] Other (See Comments)    Tingling in hands and feet. Trouble speaking    Other     Other reaction(s): nausea   Pneumococcal 13-Val Conj Vacc Other (See Comments)   Pneumococcal 13-Val Conj Vacc     Other Reaction(s): URI, local swelling, redness   "

## 2025-01-02 DIAGNOSIS — R202 Paresthesia of skin: Secondary | ICD-10-CM | POA: Insufficient documentation

## 2025-01-05 ENCOUNTER — Telehealth: Payer: Self-pay | Admitting: Neurology

## 2025-01-05 NOTE — Telephone Encounter (Signed)
 No auth required sent to [facility] [phone]

## 2025-01-28 ENCOUNTER — Other Ambulatory Visit

## 2025-02-19 ENCOUNTER — Other Ambulatory Visit

## 2025-02-23 ENCOUNTER — Ambulatory Visit: Admitting: Neurology

## 2025-08-06 ENCOUNTER — Ambulatory Visit: Admitting: Neurology

## 2025-11-20 ENCOUNTER — Inpatient Hospital Stay: Admitting: Adult Health
